# Patient Record
Sex: Male | Born: 1960 | Race: White | Hispanic: No | Marital: Married | Smoking: Never smoker
Health system: Southern US, Community
[De-identification: ages and names within clinical notes are randomized; demographics above are authoritative.]

## PROBLEM LIST (undated history)

## (undated) DIAGNOSIS — E785 Hyperlipidemia, unspecified: Secondary | ICD-10-CM

## (undated) DIAGNOSIS — I639 Cerebral infarction, unspecified: Secondary | ICD-10-CM

## (undated) DIAGNOSIS — I240 Acute coronary thrombosis not resulting in myocardial infarction: Secondary | ICD-10-CM

## (undated) DIAGNOSIS — I10 Essential (primary) hypertension: Secondary | ICD-10-CM

## (undated) DIAGNOSIS — E119 Type 2 diabetes mellitus without complications: Secondary | ICD-10-CM

## (undated) HISTORY — PX: HERNIA REPAIR: SHX51

## (undated) HISTORY — DX: Hyperlipidemia, unspecified: E78.5

## (undated) HISTORY — DX: Acute coronary thrombosis not resulting in myocardial infarction: I24.0

## (undated) HISTORY — DX: Cerebral infarction, unspecified: I63.9

---

## 2015-07-18 DIAGNOSIS — E782 Mixed hyperlipidemia: Secondary | ICD-10-CM | POA: Diagnosis not present

## 2015-07-18 DIAGNOSIS — I10 Essential (primary) hypertension: Secondary | ICD-10-CM | POA: Diagnosis not present

## 2015-07-18 DIAGNOSIS — E1165 Type 2 diabetes mellitus with hyperglycemia: Secondary | ICD-10-CM | POA: Diagnosis not present

## 2015-07-24 DIAGNOSIS — E1165 Type 2 diabetes mellitus with hyperglycemia: Secondary | ICD-10-CM | POA: Diagnosis not present

## 2015-07-24 DIAGNOSIS — N529 Male erectile dysfunction, unspecified: Secondary | ICD-10-CM | POA: Diagnosis not present

## 2015-07-24 DIAGNOSIS — I10 Essential (primary) hypertension: Secondary | ICD-10-CM | POA: Diagnosis not present

## 2015-07-24 DIAGNOSIS — E782 Mixed hyperlipidemia: Secondary | ICD-10-CM | POA: Diagnosis not present

## 2016-10-22 DIAGNOSIS — M1712 Unilateral primary osteoarthritis, left knee: Secondary | ICD-10-CM | POA: Diagnosis not present

## 2016-10-22 DIAGNOSIS — Z87891 Personal history of nicotine dependence: Secondary | ICD-10-CM | POA: Diagnosis not present

## 2016-10-22 DIAGNOSIS — R030 Elevated blood-pressure reading, without diagnosis of hypertension: Secondary | ICD-10-CM | POA: Diagnosis not present

## 2016-10-22 DIAGNOSIS — M25462 Effusion, left knee: Secondary | ICD-10-CM | POA: Diagnosis not present

## 2016-10-22 DIAGNOSIS — M25562 Pain in left knee: Secondary | ICD-10-CM | POA: Diagnosis not present

## 2016-10-22 DIAGNOSIS — I10 Essential (primary) hypertension: Secondary | ICD-10-CM | POA: Diagnosis not present

## 2016-10-28 DIAGNOSIS — M1712 Unilateral primary osteoarthritis, left knee: Secondary | ICD-10-CM | POA: Diagnosis not present

## 2016-10-28 DIAGNOSIS — E782 Mixed hyperlipidemia: Secondary | ICD-10-CM | POA: Diagnosis not present

## 2016-10-28 DIAGNOSIS — E1165 Type 2 diabetes mellitus with hyperglycemia: Secondary | ICD-10-CM | POA: Diagnosis not present

## 2016-10-28 DIAGNOSIS — I10 Essential (primary) hypertension: Secondary | ICD-10-CM | POA: Diagnosis not present

## 2016-12-08 DIAGNOSIS — E1165 Type 2 diabetes mellitus with hyperglycemia: Secondary | ICD-10-CM | POA: Diagnosis not present

## 2016-12-08 DIAGNOSIS — E7800 Pure hypercholesterolemia, unspecified: Secondary | ICD-10-CM | POA: Diagnosis not present

## 2016-12-08 DIAGNOSIS — E291 Testicular hypofunction: Secondary | ICD-10-CM | POA: Diagnosis not present

## 2016-12-08 DIAGNOSIS — I10 Essential (primary) hypertension: Secondary | ICD-10-CM | POA: Diagnosis not present

## 2016-12-08 DIAGNOSIS — E782 Mixed hyperlipidemia: Secondary | ICD-10-CM | POA: Diagnosis not present

## 2016-12-11 DIAGNOSIS — E1165 Type 2 diabetes mellitus with hyperglycemia: Secondary | ICD-10-CM | POA: Diagnosis not present

## 2016-12-11 DIAGNOSIS — I10 Essential (primary) hypertension: Secondary | ICD-10-CM | POA: Diagnosis not present

## 2016-12-11 DIAGNOSIS — E291 Testicular hypofunction: Secondary | ICD-10-CM | POA: Diagnosis not present

## 2016-12-11 DIAGNOSIS — E782 Mixed hyperlipidemia: Secondary | ICD-10-CM | POA: Diagnosis not present

## 2017-03-19 DIAGNOSIS — I10 Essential (primary) hypertension: Secondary | ICD-10-CM | POA: Diagnosis not present

## 2017-03-19 DIAGNOSIS — E1165 Type 2 diabetes mellitus with hyperglycemia: Secondary | ICD-10-CM | POA: Diagnosis not present

## 2017-03-19 DIAGNOSIS — E782 Mixed hyperlipidemia: Secondary | ICD-10-CM | POA: Diagnosis not present

## 2017-03-19 DIAGNOSIS — M1712 Unilateral primary osteoarthritis, left knee: Secondary | ICD-10-CM | POA: Diagnosis not present

## 2017-05-06 DIAGNOSIS — Z6833 Body mass index (BMI) 33.0-33.9, adult: Secondary | ICD-10-CM | POA: Diagnosis not present

## 2017-05-06 DIAGNOSIS — M1712 Unilateral primary osteoarthritis, left knee: Secondary | ICD-10-CM | POA: Diagnosis not present

## 2017-05-06 DIAGNOSIS — E1165 Type 2 diabetes mellitus with hyperglycemia: Secondary | ICD-10-CM | POA: Diagnosis not present

## 2017-06-16 DIAGNOSIS — E7800 Pure hypercholesterolemia, unspecified: Secondary | ICD-10-CM | POA: Diagnosis not present

## 2017-06-16 DIAGNOSIS — E782 Mixed hyperlipidemia: Secondary | ICD-10-CM | POA: Diagnosis not present

## 2017-06-16 DIAGNOSIS — E1165 Type 2 diabetes mellitus with hyperglycemia: Secondary | ICD-10-CM | POA: Diagnosis not present

## 2017-06-16 DIAGNOSIS — I10 Essential (primary) hypertension: Secondary | ICD-10-CM | POA: Diagnosis not present

## 2017-06-24 DIAGNOSIS — M1712 Unilateral primary osteoarthritis, left knee: Secondary | ICD-10-CM | POA: Diagnosis not present

## 2017-06-24 DIAGNOSIS — I10 Essential (primary) hypertension: Secondary | ICD-10-CM | POA: Diagnosis not present

## 2017-06-24 DIAGNOSIS — E782 Mixed hyperlipidemia: Secondary | ICD-10-CM | POA: Diagnosis not present

## 2017-06-24 DIAGNOSIS — E1165 Type 2 diabetes mellitus with hyperglycemia: Secondary | ICD-10-CM | POA: Diagnosis not present

## 2017-09-28 DIAGNOSIS — E1165 Type 2 diabetes mellitus with hyperglycemia: Secondary | ICD-10-CM | POA: Diagnosis not present

## 2017-09-28 DIAGNOSIS — Z6834 Body mass index (BMI) 34.0-34.9, adult: Secondary | ICD-10-CM | POA: Diagnosis not present

## 2017-09-28 DIAGNOSIS — E782 Mixed hyperlipidemia: Secondary | ICD-10-CM | POA: Diagnosis not present

## 2017-09-28 DIAGNOSIS — I10 Essential (primary) hypertension: Secondary | ICD-10-CM | POA: Diagnosis not present

## 2017-12-24 DIAGNOSIS — E1165 Type 2 diabetes mellitus with hyperglycemia: Secondary | ICD-10-CM | POA: Diagnosis not present

## 2017-12-24 DIAGNOSIS — Z8249 Family history of ischemic heart disease and other diseases of the circulatory system: Secondary | ICD-10-CM | POA: Diagnosis not present

## 2017-12-24 DIAGNOSIS — I209 Angina pectoris, unspecified: Secondary | ICD-10-CM | POA: Diagnosis not present

## 2017-12-24 DIAGNOSIS — E782 Mixed hyperlipidemia: Secondary | ICD-10-CM | POA: Diagnosis not present

## 2017-12-24 DIAGNOSIS — I10 Essential (primary) hypertension: Secondary | ICD-10-CM | POA: Diagnosis not present

## 2017-12-30 DIAGNOSIS — E1165 Type 2 diabetes mellitus with hyperglycemia: Secondary | ICD-10-CM | POA: Diagnosis not present

## 2017-12-30 DIAGNOSIS — Z8249 Family history of ischemic heart disease and other diseases of the circulatory system: Secondary | ICD-10-CM | POA: Diagnosis not present

## 2017-12-30 DIAGNOSIS — E782 Mixed hyperlipidemia: Secondary | ICD-10-CM | POA: Diagnosis not present

## 2017-12-30 DIAGNOSIS — I10 Essential (primary) hypertension: Secondary | ICD-10-CM | POA: Diagnosis not present

## 2018-03-30 DIAGNOSIS — Z6835 Body mass index (BMI) 35.0-35.9, adult: Secondary | ICD-10-CM | POA: Diagnosis not present

## 2018-03-30 DIAGNOSIS — E782 Mixed hyperlipidemia: Secondary | ICD-10-CM | POA: Diagnosis not present

## 2018-03-30 DIAGNOSIS — E1165 Type 2 diabetes mellitus with hyperglycemia: Secondary | ICD-10-CM | POA: Diagnosis not present

## 2018-03-30 DIAGNOSIS — I10 Essential (primary) hypertension: Secondary | ICD-10-CM | POA: Diagnosis not present

## 2018-08-10 DIAGNOSIS — E7800 Pure hypercholesterolemia, unspecified: Secondary | ICD-10-CM | POA: Diagnosis not present

## 2018-08-10 DIAGNOSIS — I10 Essential (primary) hypertension: Secondary | ICD-10-CM | POA: Diagnosis not present

## 2018-08-10 DIAGNOSIS — E1165 Type 2 diabetes mellitus with hyperglycemia: Secondary | ICD-10-CM | POA: Diagnosis not present

## 2018-08-10 DIAGNOSIS — E782 Mixed hyperlipidemia: Secondary | ICD-10-CM | POA: Diagnosis not present

## 2018-10-26 DIAGNOSIS — Z0001 Encounter for general adult medical examination with abnormal findings: Secondary | ICD-10-CM | POA: Diagnosis not present

## 2018-10-29 DIAGNOSIS — E7800 Pure hypercholesterolemia, unspecified: Secondary | ICD-10-CM | POA: Diagnosis not present

## 2018-10-29 DIAGNOSIS — Z8249 Family history of ischemic heart disease and other diseases of the circulatory system: Secondary | ICD-10-CM | POA: Diagnosis not present

## 2018-10-29 DIAGNOSIS — I10 Essential (primary) hypertension: Secondary | ICD-10-CM | POA: Diagnosis not present

## 2018-10-29 DIAGNOSIS — E1169 Type 2 diabetes mellitus with other specified complication: Secondary | ICD-10-CM | POA: Diagnosis not present

## 2019-01-25 DIAGNOSIS — E782 Mixed hyperlipidemia: Secondary | ICD-10-CM | POA: Diagnosis not present

## 2019-01-25 DIAGNOSIS — E1169 Type 2 diabetes mellitus with other specified complication: Secondary | ICD-10-CM | POA: Diagnosis not present

## 2019-01-25 DIAGNOSIS — E7800 Pure hypercholesterolemia, unspecified: Secondary | ICD-10-CM | POA: Diagnosis not present

## 2019-01-25 DIAGNOSIS — E1165 Type 2 diabetes mellitus with hyperglycemia: Secondary | ICD-10-CM | POA: Diagnosis not present

## 2019-01-27 DIAGNOSIS — E782 Mixed hyperlipidemia: Secondary | ICD-10-CM | POA: Diagnosis not present

## 2019-01-27 DIAGNOSIS — Z6833 Body mass index (BMI) 33.0-33.9, adult: Secondary | ICD-10-CM | POA: Diagnosis not present

## 2019-01-27 DIAGNOSIS — I10 Essential (primary) hypertension: Secondary | ICD-10-CM | POA: Diagnosis not present

## 2019-01-27 DIAGNOSIS — E1169 Type 2 diabetes mellitus with other specified complication: Secondary | ICD-10-CM | POA: Diagnosis not present

## 2019-10-12 DIAGNOSIS — I10 Essential (primary) hypertension: Secondary | ICD-10-CM | POA: Diagnosis not present

## 2019-10-12 DIAGNOSIS — E1169 Type 2 diabetes mellitus with other specified complication: Secondary | ICD-10-CM | POA: Diagnosis not present

## 2019-10-28 DIAGNOSIS — E1169 Type 2 diabetes mellitus with other specified complication: Secondary | ICD-10-CM | POA: Diagnosis not present

## 2019-10-28 DIAGNOSIS — Z6833 Body mass index (BMI) 33.0-33.9, adult: Secondary | ICD-10-CM | POA: Diagnosis not present

## 2019-10-28 DIAGNOSIS — I10 Essential (primary) hypertension: Secondary | ICD-10-CM | POA: Diagnosis not present

## 2019-10-28 DIAGNOSIS — E782 Mixed hyperlipidemia: Secondary | ICD-10-CM | POA: Diagnosis not present

## 2019-12-21 DIAGNOSIS — E782 Mixed hyperlipidemia: Secondary | ICD-10-CM | POA: Diagnosis not present

## 2019-12-21 DIAGNOSIS — E1169 Type 2 diabetes mellitus with other specified complication: Secondary | ICD-10-CM | POA: Diagnosis not present

## 2019-12-21 DIAGNOSIS — I10 Essential (primary) hypertension: Secondary | ICD-10-CM | POA: Diagnosis not present

## 2019-12-21 DIAGNOSIS — E7800 Pure hypercholesterolemia, unspecified: Secondary | ICD-10-CM | POA: Diagnosis not present

## 2019-12-27 DIAGNOSIS — E1169 Type 2 diabetes mellitus with other specified complication: Secondary | ICD-10-CM | POA: Diagnosis not present

## 2019-12-27 DIAGNOSIS — I10 Essential (primary) hypertension: Secondary | ICD-10-CM | POA: Diagnosis not present

## 2019-12-27 DIAGNOSIS — E782 Mixed hyperlipidemia: Secondary | ICD-10-CM | POA: Diagnosis not present

## 2019-12-27 DIAGNOSIS — Z8249 Family history of ischemic heart disease and other diseases of the circulatory system: Secondary | ICD-10-CM | POA: Diagnosis not present

## 2020-01-10 DIAGNOSIS — U071 COVID-19: Secondary | ICD-10-CM | POA: Diagnosis not present

## 2020-01-17 DIAGNOSIS — B948 Sequelae of other specified infectious and parasitic diseases: Secondary | ICD-10-CM | POA: Diagnosis not present

## 2020-01-17 DIAGNOSIS — R5383 Other fatigue: Secondary | ICD-10-CM | POA: Diagnosis not present

## 2020-01-17 DIAGNOSIS — Z20828 Contact with and (suspected) exposure to other viral communicable diseases: Secondary | ICD-10-CM | POA: Diagnosis not present

## 2020-01-30 DIAGNOSIS — B948 Sequelae of other specified infectious and parasitic diseases: Secondary | ICD-10-CM | POA: Diagnosis not present

## 2020-01-30 DIAGNOSIS — E782 Mixed hyperlipidemia: Secondary | ICD-10-CM | POA: Diagnosis not present

## 2020-01-30 DIAGNOSIS — E1169 Type 2 diabetes mellitus with other specified complication: Secondary | ICD-10-CM | POA: Diagnosis not present

## 2020-01-30 DIAGNOSIS — R5383 Other fatigue: Secondary | ICD-10-CM | POA: Diagnosis not present

## 2020-08-29 DIAGNOSIS — I5042 Chronic combined systolic (congestive) and diastolic (congestive) heart failure: Secondary | ICD-10-CM | POA: Insufficient documentation

## 2020-08-30 ENCOUNTER — Emergency Department (HOSPITAL_COMMUNITY): Payer: BC Managed Care – PPO

## 2020-08-30 ENCOUNTER — Inpatient Hospital Stay (HOSPITAL_COMMUNITY)
Admission: EM | Admit: 2020-08-30 | Discharge: 2020-09-03 | DRG: 064 | Disposition: A | Discharge status: Home / Self Care | Payer: BC Managed Care – PPO | Attending: Internal Medicine | Admitting: Internal Medicine

## 2020-08-30 ENCOUNTER — Other Ambulatory Visit: Payer: Self-pay

## 2020-08-30 ENCOUNTER — Encounter (HOSPITAL_COMMUNITY): Payer: Self-pay | Admitting: Emergency Medicine

## 2020-08-30 DIAGNOSIS — Z8249 Family history of ischemic heart disease and other diseases of the circulatory system: Secondary | ICD-10-CM | POA: Diagnosis not present

## 2020-08-30 DIAGNOSIS — Z79899 Other long term (current) drug therapy: Secondary | ICD-10-CM | POA: Diagnosis not present

## 2020-08-30 DIAGNOSIS — H538 Other visual disturbances: Secondary | ICD-10-CM | POA: Diagnosis present

## 2020-08-30 DIAGNOSIS — I110 Hypertensive heart disease with heart failure: Secondary | ICD-10-CM | POA: Diagnosis present

## 2020-08-30 DIAGNOSIS — I429 Cardiomyopathy, unspecified: Secondary | ICD-10-CM | POA: Diagnosis present

## 2020-08-30 DIAGNOSIS — I10 Essential (primary) hypertension: Secondary | ICD-10-CM | POA: Diagnosis not present

## 2020-08-30 DIAGNOSIS — R739 Hyperglycemia, unspecified: Secondary | ICD-10-CM

## 2020-08-30 DIAGNOSIS — Z6834 Body mass index (BMI) 34.0-34.9, adult: Secondary | ICD-10-CM

## 2020-08-30 DIAGNOSIS — R911 Solitary pulmonary nodule: Secondary | ICD-10-CM

## 2020-08-30 DIAGNOSIS — Z833 Family history of diabetes mellitus: Secondary | ICD-10-CM

## 2020-08-30 DIAGNOSIS — I5021 Acute systolic (congestive) heart failure: Secondary | ICD-10-CM | POA: Diagnosis present

## 2020-08-30 DIAGNOSIS — R29810 Facial weakness: Secondary | ICD-10-CM | POA: Diagnosis present

## 2020-08-30 DIAGNOSIS — R471 Dysarthria and anarthria: Secondary | ICD-10-CM | POA: Diagnosis present

## 2020-08-30 DIAGNOSIS — R1310 Dysphagia, unspecified: Secondary | ICD-10-CM | POA: Diagnosis present

## 2020-08-30 DIAGNOSIS — E669 Obesity, unspecified: Secondary | ICD-10-CM | POA: Diagnosis present

## 2020-08-30 DIAGNOSIS — Z7982 Long term (current) use of aspirin: Secondary | ICD-10-CM | POA: Diagnosis not present

## 2020-08-30 DIAGNOSIS — Z20822 Contact with and (suspected) exposure to covid-19: Secondary | ICD-10-CM | POA: Diagnosis present

## 2020-08-30 DIAGNOSIS — R000 Tachycardia, unspecified: Secondary | ICD-10-CM | POA: Diagnosis present

## 2020-08-30 DIAGNOSIS — G4730 Sleep apnea, unspecified: Secondary | ICD-10-CM | POA: Diagnosis present

## 2020-08-30 DIAGNOSIS — I672 Cerebral atherosclerosis: Secondary | ICD-10-CM | POA: Diagnosis present

## 2020-08-30 DIAGNOSIS — Z87891 Personal history of nicotine dependence: Secondary | ICD-10-CM | POA: Diagnosis not present

## 2020-08-30 DIAGNOSIS — E1165 Type 2 diabetes mellitus with hyperglycemia: Secondary | ICD-10-CM | POA: Diagnosis present

## 2020-08-30 DIAGNOSIS — R29701 NIHSS score 1: Secondary | ICD-10-CM | POA: Diagnosis present

## 2020-08-30 DIAGNOSIS — I639 Cerebral infarction, unspecified: Secondary | ICD-10-CM

## 2020-08-30 DIAGNOSIS — I6381 Other cerebral infarction due to occlusion or stenosis of small artery: Secondary | ICD-10-CM | POA: Diagnosis not present

## 2020-08-30 DIAGNOSIS — E1169 Type 2 diabetes mellitus with other specified complication: Secondary | ICD-10-CM | POA: Diagnosis not present

## 2020-08-30 DIAGNOSIS — E785 Hyperlipidemia, unspecified: Secondary | ICD-10-CM | POA: Diagnosis present

## 2020-08-30 DIAGNOSIS — R4701 Aphasia: Secondary | ICD-10-CM | POA: Diagnosis present

## 2020-08-30 DIAGNOSIS — E119 Type 2 diabetes mellitus without complications: Secondary | ICD-10-CM

## 2020-08-30 DIAGNOSIS — Z7984 Long term (current) use of oral hypoglycemic drugs: Secondary | ICD-10-CM

## 2020-08-30 DIAGNOSIS — I6389 Other cerebral infarction: Secondary | ICD-10-CM | POA: Diagnosis not present

## 2020-08-30 HISTORY — DX: Cerebral infarction, unspecified: I63.9

## 2020-08-30 HISTORY — DX: Essential (primary) hypertension: I10

## 2020-08-30 HISTORY — DX: Type 2 diabetes mellitus without complications: E11.9

## 2020-08-30 LAB — CBC
HCT: 45.1 % (ref 39.0–52.0)
Hemoglobin: 15.1 g/dL (ref 13.0–17.0)
MCH: 28.8 pg (ref 26.0–34.0)
MCHC: 33.5 g/dL (ref 30.0–36.0)
MCV: 85.9 fL (ref 80.0–100.0)
Platelets: 208 10*3/uL (ref 150–400)
RBC: 5.25 MIL/uL (ref 4.22–5.81)
RDW: 13.5 % (ref 11.5–15.5)
WBC: 6.5 10*3/uL (ref 4.0–10.5)
nRBC: 0 % (ref 0.0–0.2)

## 2020-08-30 LAB — URINALYSIS, ROUTINE W REFLEX MICROSCOPIC
Bacteria, UA: NONE SEEN
Bilirubin Urine: NEGATIVE
Glucose, UA: >=500 mg/dL — AB
Hgb urine dipstick: NEGATIVE
Ketones, ur: 5 mg/dL — AB
Leukocytes,Ua: NEGATIVE
Nitrite: NEGATIVE
Protein, ur: NEGATIVE mg/dL
Specific Gravity, Urine: 1.023 (ref 1.005–1.030)
pH: 6 (ref 5.0–8.0)

## 2020-08-30 LAB — COMPREHENSIVE METABOLIC PANEL
ALT: 44 U/L (ref 0–44)
AST: 37 U/L (ref 15–41)
Albumin: 4 g/dL (ref 3.5–5.0)
Alkaline Phosphatase: 83 U/L (ref 38–126)
Anion gap: 9 (ref 5–15)
BUN: 11 mg/dL (ref 8–23)
CO2: 29 mmol/L (ref 22–32)
Calcium: 8.9 mg/dL (ref 8.9–10.3)
Chloride: 94 mmol/L — ABNORMAL LOW (ref 98–111)
Creatinine, Ser: 0.71 mg/dL (ref 0.61–1.24)
GFR, Estimated: >60 mL/min (ref >60)
Glucose, Bld: 341 mg/dL — ABNORMAL HIGH (ref 70–99)
Potassium: 3.6 mmol/L (ref 3.5–5.1)
Sodium: 132 mmol/L — ABNORMAL LOW (ref 135–145)
Total Bilirubin: 1.1 mg/dL (ref 0.3–1.2)
Total Protein: 7.6 g/dL (ref 6.5–8.1)

## 2020-08-30 LAB — I-STAT CHEM 8, ED
BUN: 10 mg/dL (ref 8–23)
Calcium, Ion: 0.98 mmol/L — ABNORMAL LOW (ref 1.15–1.40)
Chloride: 94 mmol/L — ABNORMAL LOW (ref 98–111)
Creatinine, Ser: 0.6 mg/dL — ABNORMAL LOW (ref 0.61–1.24)
Glucose, Bld: 348 mg/dL — ABNORMAL HIGH (ref 70–99)
HCT: 47 % (ref 39.0–52.0)
Hemoglobin: 16 g/dL (ref 13.0–17.0)
Potassium: 3.7 mmol/L (ref 3.5–5.1)
Sodium: 135 mmol/L (ref 135–145)
TCO2: 29 mmol/L (ref 22–32)

## 2020-08-30 LAB — CBG MONITORING, ED: Glucose-Capillary: 350 mg/dL — ABNORMAL HIGH (ref 70–99)

## 2020-08-30 LAB — DIFFERENTIAL
Abs Immature Granulocytes: 0.01 10*3/uL (ref 0.00–0.07)
Basophils Absolute: 0 10*3/uL (ref 0.0–0.1)
Basophils Relative: 1 %
Eosinophils Absolute: 0.1 10*3/uL (ref 0.0–0.5)
Eosinophils Relative: 2 %
Immature Granulocytes: 0 %
Lymphocytes Relative: 20 %
Lymphs Abs: 1.3 10*3/uL (ref 0.7–4.0)
Monocytes Absolute: 0.4 10*3/uL (ref 0.1–1.0)
Monocytes Relative: 7 %
Neutro Abs: 4.6 10*3/uL (ref 1.7–7.7)
Neutrophils Relative %: 70 %

## 2020-08-30 LAB — RAPID URINE DRUG SCREEN, HOSP PERFORMED
Amphetamines: NOT DETECTED
Barbiturates: NOT DETECTED
Benzodiazepines: NOT DETECTED
Cocaine: NOT DETECTED
Opiates: NOT DETECTED
Tetrahydrocannabinol: NOT DETECTED

## 2020-08-30 LAB — APTT: aPTT: 27 seconds (ref 24–36)

## 2020-08-30 LAB — ETHANOL: Alcohol, Ethyl (B): <10 mg/dL (ref <10)

## 2020-08-30 LAB — PROTIME-INR
INR: 0.9 (ref 0.8–1.2)
Prothrombin Time: 12.4 seconds (ref 11.4–15.2)

## 2020-08-30 LAB — RESP PANEL BY RT-PCR (FLU A&B, COVID) ARPGX2
Influenza A by PCR: NEGATIVE
Influenza B by PCR: NEGATIVE
SARS Coronavirus 2 by RT PCR: NEGATIVE

## 2020-08-30 MED ORDER — CLOPIDOGREL BISULFATE 75 MG PO TABS: 75.0000 mg | ORAL_TABLET | Freq: Every day | ORAL | Status: DC

## 2020-08-30 MED ORDER — INSULIN ASPART 100 UNIT/ML IJ SOLN
10.0000 [IU] | Freq: Once | INTRAMUSCULAR | Status: AC
  Administered 2020-08-30: 10 [IU] via INTRAVENOUS
  Filled 2020-08-30: qty 1

## 2020-08-30 MED ORDER — ASPIRIN 300 MG RE SUPP
300.0000 mg | Freq: Once | RECTAL | Status: AC
  Administered 2020-08-31: 300 mg via RECTAL
  Filled 2020-08-30: qty 1

## 2020-08-30 MED ORDER — SODIUM CHLORIDE 0.9 % IV BOLUS
1000.0000 mL | Freq: Once | INTRAVENOUS | Status: AC
  Administered 2020-08-30: 1000 mL via INTRAVENOUS

## 2020-08-30 MED ORDER — ASPIRIN EC 81 MG PO TBEC: 81.0000 mg | DELAYED_RELEASE_TABLET | Freq: Every day | ORAL | Status: DC

## 2020-08-30 NOTE — ED Provider Notes (Signed)
[facility] EMERGENCY DEPARTMENT Provider Note   CSN: [id number] Arrival date & time: 08/30/20  1006     History Chief Complaint  Patient presents with   Aphasia    Tony Combs is a 60 y.o. male.  Patient presenting with the onset of slurred speech blurry vision double vision 9:00 yesterday morning.  Patient was evaluated at work and then sent to [facility] at 1500.  Patient had a long wait however he did have a head CT done and blood work done.  Patient's wait was so long he left AMA.  Patient still with the slurred speech.  Also with a complaint of a headache.  Along with the visual changes.  Also states that his right side of his face is weak.  And has some tingling on the right side of the face.  Patient states he has had symptoms like this intermittently lasting for a few hours over the last 2 months.  Also has felt dizzy at work at times for the past 2 months.  Denies any upper extremity or lower extremity weakness.  Past medical history significant for diabetes and hypertension.  Patient does have a primary care doctor.      Past Medical History:  Diagnosis Date   Diabetes mellitus without complication (HCC)    Hypertension     There are no problems to display for this patient.   Past Surgical History:  Procedure Laterality Date   HERNIA REPAIR         No family history on file.  Social History   Tobacco Use   Smoking status: Never   Smokeless tobacco: Never  Substance Use Topics   Alcohol use: Not Currently   Drug use: Not Currently    Home Medications Prior to Admission medications   Medication Sig Start Date End Date Taking? Authorizing Provider  amLODipine (NORVASC) 10 MG tablet Take 10 mg by mouth daily.   Yes [provider]  amoxicillin (AMOXIL) 500 MG capsule Take 500 mg by mouth 3 (three) times daily. 08/23/20  Yes [provider]  atorvastatin (LIPITOR) 20 MG tablet Take 20 mg by mouth daily.   Yes [provider]   glipiZIDE (GLUCOTROL) 10 MG tablet Take 10 mg by mouth in the morning and at bedtime.   Yes [provider]  hydrochlorothiazide (HYDRODIURIL) 25 MG tablet Take 25 mg by mouth daily.   Yes [provider]  metFORMIN (GLUCOPHAGE) 500 MG tablet Take 500 mg by mouth 2 (two) times daily. 03/25/20  Yes [provider]  RYBELSUS 7 MG TABS Take 1 tablet by mouth daily. 03/22/20  Yes [provider]  ibuprofen (ADVIL) 600 MG tablet Take 1 tablet by mouth daily as needed. 08/23/20   [provider]    Allergies    Patient has no known allergies.  Review of Systems   Review of Systems  Constitutional:  Negative for chills and fever.  HENT:  Negative for congestion, rhinorrhea and sore throat.   Eyes:  Positive for visual disturbance.  Respiratory:  Negative for cough and shortness of breath.   Cardiovascular:  Negative for chest pain and leg swelling.  Gastrointestinal:  Negative for abdominal pain, diarrhea, nausea and vomiting.  Genitourinary:  Negative for dysuria.  Musculoskeletal:  Negative for back pain and neck pain.  Skin:  Negative for rash.  Neurological:  Positive for facial asymmetry, speech difficulty, weakness, numbness and headaches. Negative for dizziness, tremors, syncope and light-headedness.  Hematological:  Does  not bruise/bleed easily.  Psychiatric/Behavioral:  Negative for confusion.    Physical Exam Updated Vital Signs BP (!) 141/87   Pulse (!) 109   Temp 98 F (36.7 C) (Oral)   Resp 20   Ht 1.727 m (5\' 8" )   Wt 101.6 kg   SpO2 96%   BMI 34.06 kg/m   Physical Exam Vitals and nursing note reviewed.  Constitutional:      General: He is not in acute distress.    Appearance: Normal appearance. He is well-developed.  HENT:     Head: Normocephalic and atraumatic.  Eyes:     Extraocular Movements: Extraocular movements intact.     Conjunctiva/sclera: Conjunctivae normal.     Pupils: Pupils are equal, round, and reactive to  light.  Cardiovascular:     Rate and Rhythm: Normal rate and regular rhythm.     Heart sounds: No murmur heard. Pulmonary:     Effort: Pulmonary effort is normal. No respiratory distress.     Breath sounds: Normal breath sounds. No wheezing.  Abdominal:     Palpations: Abdomen is soft.     Tenderness: There is no abdominal tenderness. There is no guarding.  Musculoskeletal:        General: No swelling.     Cervical back: Normal range of motion and neck supple. No rigidity.  Skin:    General: Skin is warm and dry.     Capillary Refill: Capillary refill takes less than 2 seconds.     Findings: No rash.  Neurological:     Mental Status: He is alert and oriented to person, place, and time.     Cranial Nerves: Cranial nerve deficit present.     Sensory: No sensory deficit.     Motor: No weakness.     Coordination: Coordination normal.     Comments: Patient with some numbness to the right side of the face.  There is right facial weakness.  With slight involvement of the forehead area as well.  Speech is slurred.  No coordination problems.  No motor weakness.  No numbness in the extremities    ED Results / Procedures / Treatments   Labs (all labs ordered are listed, but only abnormal results are displayed) Labs Reviewed  COMPREHENSIVE METABOLIC PANEL - Abnormal; Notable for the following components:      Result Value   Sodium 132 (*)    Chloride 94 (*)    Glucose, Bld 341 (*)    All other components within normal limits  URINALYSIS, ROUTINE W REFLEX MICROSCOPIC - Abnormal; Notable for the following components:   Glucose, UA >=500 (*)    Ketones, ur 5 (*)    All other components within normal limits  I-STAT CHEM 8, ED - Abnormal; Notable for the following components:   Chloride 94 (*)    Creatinine, Ser 0.60 (*)    Glucose, Bld 348 (*)    Calcium, Ion 0.98 (*)    All other components within normal limits  CBG MONITORING, ED - Abnormal; Notable for the following components:    Glucose-Capillary 350 (*)    All other components within normal limits  RESP PANEL BY RT-PCR (FLU A&B, COVID) ARPGX2  ETHANOL  PROTIME-INR  APTT  CBC  DIFFERENTIAL  RAPID URINE DRUG SCREEN, HOSP PERFORMED    EKG EKG Interpretation  Date/Time:  Thursday August 30 2020 10:28:09 EDT Ventricular Rate:  117 PR Interval:  155 QRS Duration: 88 QT Interval:  330 QTC Calculation: 461 R Axis:   -  30 Text Interpretation: Sinus tachycardia Left ventricular hypertrophy Borderline T abnormalities, lateral leads Baseline wander in lead(s) III No previous ECGs available Confirmed by Vanetta MuldersZackowski, Boleslaus Holloway [phone]([id number]) on 08/30/2020 10:38:20 AM  Radiology CT HEAD WO CONTRAST  Result Date: 08/30/2020 CLINICAL DATA:  Slurred speech for 1 month EXAM: CT HEAD WITHOUT CONTRAST TECHNIQUE: Contiguous axial images were obtained from the base of the skull through the vertex without intravenous contrast. COMPARISON:  08/29/2020 FINDINGS: Brain: No evidence of acute infarction, hemorrhage, hydrocephalus, extra-axial collection or mass lesion/mass effect. Cavum septum pellucidum incidentally noted. Minimal low-density changes within the periventricular and subcortical white matter compatible with chronic microvascular ischemic change. Vascular: Atherosclerotic calcifications involving the large vessels of the skull base. No unexpected hyperdense vessel. Skull: Normal. Negative for fracture or focal lesion. Sinuses/Orbits: No acute finding. Other: None. IMPRESSION: 1. No acute intracranial findings. 2. Mild chronic microvascular ischemic change. Electronically Signed   By: Duanne GuessNicholas  Plundo D.O.   On: 08/30/2020 12:01   DG Chest Port 1 View  Result Date: 08/30/2020 CLINICAL DATA:  Dizziness. EXAM: PORTABLE CHEST 1 VIEW COMPARISON:  None. FINDINGS: The heart size and mediastinal contours are within normal limits. Hazy left basilar opacities. No visible pleural effusions or pneumothorax on this single semi-upright radiograph.  IMPRESSION: Hazy left basilar opacities, which may represent prominent diaphragmatic/mediastinal fat and/or atelectasis. Infection is difficult to exclude in this area and dedicated PA and lateral radiographs could further characterize if clinically indicated. Electronically Signed   By: Feliberto HartsFrederick S Altergott MD   On: 08/30/2020 11:52    Procedures Procedures   CRITICAL CARE Performed by: Vanetta MuldersScott Zavia Pullen Total critical care time: 60 minutes Critical care time was exclusive of separately billable procedures and treating other patients. Critical care was necessary to treat or prevent imminent or life-threatening deterioration. Critical care was time spent personally by me on the following activities: development of treatment plan with patient and/or surrogate as well as nursing, discussions with consultants, evaluation of patient's response to treatment, examination of patient, obtaining history from patient or surrogate, ordering and performing treatments and interventions, ordering and review of laboratory studies, ordering and review of radiographic studies, pulse oximetry and re-evaluation of patient's condition.   Medications Ordered in ED Medications  insulin aspart (novoLOG) injection 10 Units (has no administration in time range)    ED Course  I have reviewed the triage vital signs and the nursing notes.  Pertinent labs & imaging results that were available during my care of the patient were reviewed by me and considered in my medical decision making (see chart for details).    MDM Rules/Calculators/A&P                          Patient's presentation concerning for neoplastic process.  Certainly has had symptoms on and off at times for the past 2 months.  Certainly the symptoms also that developed just yesterday which are similar could be related to stroke.  Patient had a negative head CT [facility].  Patient's head CT today is also negative.  So patient will get  MRI.  Contacted by MRI about possible abnormality so they wanted to do MRA.  Patient's regular chest x-ray raise some concerns about pneumonia clinically was not suspicious of pneumonia but could have been suspicious for neoplastic process so CT chest without was done and that just shows some pulmonary nodules but no acute findings or any obvious neoplastic process.  Labs negative for COVID  drug screen negative.  Blood sugar elevated at 348 but not any evidence of metabolic acidosis.  Alcohol not elevated.  INR normal.  No leukocytosis no significant anemia hemoglobin is normal at 15.1.  MRIs are still pending.  Patient turned over to Dr. Elnoria Howard evening ED physician who will follow them up.  And will discuss findings if appropriate with neurology.  She at the very least will need outpatient neurology follow-up for his symptoms  Is also possible that the facial weakness could just be a Bell's palsy.  Patient able to close his eyes tightly. Final Clinical Impression(s) / ED Diagnoses Final diagnoses:  Hyperglycemia  Cerebrovascular accident (CVA), unspecified mechanism [facility])    Rx / DC Orders ED Discharge Orders     None        Vanetta Mulders, MD 08/30/20 1533

## 2020-08-30 NOTE — ED Triage Notes (Signed)
Pt reports sudden onset slurred speech, double vision and blurry vision at 0900 yesterday am; reports was seen at [facility] yesterday 1500 where they completed a CT, EKG and bloodwork but reports after 8 hours he never saw a dr so he left AMA

## 2020-08-30 NOTE — H&P (Signed)
History and Physical    Tony Combs ERX:[id number] DOB: [birth year] DOA: 08/30/2020  PCP: Juliette Alcide, MD   Patient coming from: Home  I have personally briefly reviewed patient's old medical records in [facility]  Chief Complaint: Slurred speech  HPI: Tony Combs is a 60 y.o. male with medical history significant for diabetes, hypertension.  Patient presented to the ED today with complaints of double vision, blurry vision, slurred speech.  Patient, and ex-wife/friend present at bedside reports that since May, patient has had mild symptoms involving different parts of his body that usually resolve without intervention, so he did not see a provider for these.  He reports an episode of drooping involving his left eye, double vision, dizziness, left arm paresthesias, left arm weakness, weakness of his lower extremity left and then right (not at the same time). Yesterday morning at about 9 AM, he had the most severe episode of slurred speech and double vision hence he presented to the [facility] for left before he saw a provider after he got frustrated with the long wait time. Both slurred speech and vision problems are still present today, but have improved compared to yesterday.  Ex-wife reports she can understand patient today compared to yesterday.  Patient reports intermittent difficulty swallowing worse with liquids started yesterday.  No droopiness noted, no weakness of extremities.  Patient presented at [facility] yesterday, per Care Everywhere, patient had head CT done, which was unremarkable and blood work, but due to the long wait, patient left AMA.  Per triage notes, symptoms have been ongoing since 5th of July.  ED Course: Temperature 98.1, heart rate 1 teens to 120s.  Respiratory rate 18-22.  Blood pressure systolic 130s to 950D.  Blood glucose 341.  Sodium 132.  Potassium 3.6.  Head CT here unremarkable.  MRI brain showed 12 mm acute infarct within the anterior medial right frontal  lobe, with tiny chronic infarct in left thalamus.  Subsequent MRI brain was without large vessel occlusion. Portable chest x-ray showed left basilar opacities, difficult to exclude infection, subsequent chest CT-acute abnormality, showed 4 mm right middle lobe pulmonary nodule. Hospitalist to admit for acute CVA.  Review of Systems: As per HPI all other systems reviewed and negative.  Past Medical History:  Diagnosis Date   Diabetes mellitus without complication (HCC)    Hypertension     Past Surgical History:  Procedure Laterality Date   HERNIA REPAIR       reports that he has never smoked. He has never used smokeless tobacco. He reports previous alcohol use. He reports previous drug use.  No Known Allergies  Family history of hypertension.    Prior to Admission medications   Medication Sig Start Date End Date Taking? Authorizing Provider  amLODipine (NORVASC) 10 MG tablet Take 10 mg by mouth daily.   Yes [provider]  amoxicillin (AMOXIL) 500 MG capsule Take 500 mg by mouth 3 (three) times daily. 08/23/20  Yes [provider]  atorvastatin (LIPITOR) 20 MG tablet Take 20 mg by mouth daily.   Yes [provider]  glipiZIDE (GLUCOTROL) 10 MG tablet Take 10 mg by mouth in the morning and at bedtime.   Yes [provider]  hydrochlorothiazide (HYDRODIURIL) 25 MG tablet Take 25 mg by mouth daily.   Yes [provider]  metFORMIN (GLUCOPHAGE) 500 MG tablet Take 500 mg by mouth 2 (two) times daily. 03/25/20  Yes [provider]  RYBELSUS 7 MG TABS Take 1  tablet by mouth daily. 03/22/20  Yes [provider]  ibuprofen (ADVIL) 600 MG tablet Take 1 tablet by mouth daily as needed. 08/23/20   [provider]    Physical Exam: Vitals:   08/30/20 1438 08/30/20 1500 08/30/20 1700 08/30/20 1730  BP: (!) 144/97 (!) 149/93 (!) 147/87 (!) 159/100  Pulse: (!) 57 (!) 115 (!) 105 (!) 113  Resp: Temp: 98.1 F (36.7 C)      TempSrc: Oral     SpO2: 97% 97% 98% 96%  Weight:      Height:        Constitutional: Appears slightly drowsy, calm, comfortable Vitals:   08/30/20 1438 08/30/20 1500 08/30/20 1700 08/30/20 1730  BP: (!) 144/97 (!) 149/93 (!) 147/87 (!) 159/100  Pulse: (!) 57 (!) 115 (!) 105 (!) 113  Resp: Temp: 98.1 F (36.7 C)     TempSrc: Oral     SpO2: 97% 97% 98% 96%  Weight:      Height:       Eyes: Right pupil more reactive than left, mild drooping involving upper lids worse on the right- Per ex-wife this is chronic.  Conjunctivae normal ENMT: Mucous membranes are moist.  Neck: normal, supple, no masses, no thyromegaly Respiratory: clear to auscultation bilaterally, no wheezing, no crackles. Normal respiratory effort. No accessory muscle use.  Cardiovascular: Tachycardic, regular rate and rhythm, no murmurs / rubs / gallops. No extremity edema. 2+ pedal pulses.  Abdomen: no tenderness, no masses palpated. No hepatosplenomegaly. Bowel sounds positive.  Musculoskeletal: no clubbing / cyanosis. No joint deformity upper and lower extremities. Good ROM, no contractures. Normal muscle tone.  Skin: no rashes, lesions, ulcers. No induration Neurologic:  Neurological:     GCS: GCS eye subscore is 4. GCS verbal subscore is 5. GCS motor subscore is 6.     Comments: Mental Status:  Alert, oriented, thought content appropriate, speech is moderately slurred, at times difficult to understand patient.  Some difficulty in following two-step commands cranial Nerves:  II:   pupils equal, round, right pupil reactive to light, left pupil not so much.  He is able to finger count with both eyes III,IV, VI: mild ptosis present bilaterally worse on the right, extra-ocular motions intact bilaterally  V,VII: smile symmetric, eyebrows raise symmetric, facial light touch sensation equal VIII: hearing grossly normal to voice  X:   XI: bilateral shoulder shrug symmetric and strong XII: midline tongue  extension without fassiculations Motor:  Normal tone.   5/5 strength in bilateral upper and lower extremities  Sensory: Sensation intact to light touch in all extremities.  Cerebellar: normal finger-to-nose with bilateral upper extremities.  No pronator drift.  CV: distal pulses palpable throughout  Psychiatric: Normal judgment and insight. Alert and oriented x 3. Normal mood.   Labs on Admission: I have personally reviewed following labs and imaging studies  CBC: Recent Labs  Lab 08/30/20 1037 08/30/20 1049  WBC 6.5  --   NEUTROABS 4.6  --   HGB 15.1 16.0  HCT 45.1 47.0  MCV 85.9  --   PLT 208  --    Basic Metabolic Panel: Recent Labs  Lab 08/30/20 1037 08/30/20 1049  NA 132* 135  K 3.6 3.7  CL 94* 94*  CO2 29  --   GLUCOSE 341* 348*  BUN 11 10  CREATININE 0.71 0.60*  CALCIUM 8.9  --    Liver Function Tests: Recent Labs  Lab  08/30/20 1037  AST 37  ALT 44  ALKPHOS 83  BILITOT 1.1  PROT 7.6  ALBUMIN 4.0   Coagulation Profile: Recent Labs  Lab 08/30/20 1037  INR 0.9   CBG: Recent Labs  Lab 08/30/20 1047  GLUCAP 350*       Component Value Date/Time   COLORURINE YELLOW 08/30/2020 1109   APPEARANCEUR CLEAR 08/30/2020 1109   LABSPEC 1.023 08/30/2020 1109   PHURINE 6.0 08/30/2020 1109   GLUCOSEU >=500 (A) 08/30/2020 1109   HGBUR NEGATIVE 08/30/2020 1109   BILIRUBINUR NEGATIVE 08/30/2020 1109   KETONESUR 5 (A) 08/30/2020 1109   PROTEINUR NEGATIVE 08/30/2020 1109   NITRITE NEGATIVE 08/30/2020 1109   LEUKOCYTESUR NEGATIVE 08/30/2020 1109    Radiological Exams on Admission: CT HEAD WO CONTRAST  Result Date: 08/30/2020 CLINICAL DATA:  Slurred speech for 1 month EXAM: CT HEAD WITHOUT CONTRAST TECHNIQUE: Contiguous axial images were obtained from the base of the skull through the vertex without intravenous contrast. COMPARISON:  08/29/2020 FINDINGS: Brain: No evidence of acute infarction, hemorrhage, hydrocephalus, extra-axial collection or mass  lesion/mass effect. Cavum septum pellucidum incidentally noted. Minimal low-density changes within the periventricular and subcortical white matter compatible with chronic microvascular ischemic change. Vascular: Atherosclerotic calcifications involving the large vessels of the skull base. No unexpected hyperdense vessel. Skull: Normal. Negative for fracture or focal lesion. Sinuses/Orbits: No acute finding. Other: None. IMPRESSION: 1. No acute intracranial findings. 2. Mild chronic microvascular ischemic change. Electronically Signed   By: Duanne GuessNicholas  Plundo D.O.   On: 08/30/2020 12:01   CT Chest Wo Contrast  Result Date: 08/30/2020 CLINICAL DATA:  Basilar opacities on recent chest x-ray. EXAM: CT CHEST WITHOUT CONTRAST TECHNIQUE: Multidetector CT imaging of the chest was performed following the standard protocol without IV contrast. COMPARISON:  Chest x-ray earlier today FINDINGS: Cardiovascular: The heart size is normal. No substantial pericardial effusion. Mild atherosclerotic calcification is noted in the wall of the thoracic aorta. Mediastinum/Nodes: No mediastinal lymphadenopathy. No evidence for gross hilar lymphadenopathy although assessment is limited by the lack of intravenous contrast on today's study. The esophagus has normal imaging features. There is no axillary lymphadenopathy. Lungs/Pleura: 4 mm right middle lobe nodule identified on image 90/4. Calcified granuloma noted left lower lobe. No suspicious pulmonary nodule or mass. No focal airspace consolidation. There is no evidence of pleural effusion. Upper Abdomen: The liver shows diffusely decreased attenuation suggesting fat deposition. Otherwise unremarkable visualized upper abdomen. Musculoskeletal: No worrisome lytic or sclerotic osseous abnormality. IMPRESSION: 1. No acute findings in the chest. Specifically, no evidence for pulmonary edema, pleural effusion, or focal airspace consolidation. 2. 4 mm right middle lobe pulmonary nodule. No  follow-up needed if patient is low-risk. Non-contrast chest CT can be considered in 12 months if patient is high-risk. This recommendation follows the consensus statement: Guidelines for Management of Incidental Pulmonary Nodules Detected on CT Images: From the Fleischner Society 2017; Radiology 2017; 284:228-243. 3. Hepatic steatosis. 4. Aortic Atherosclerosis (ICD10-I70.0). Electronically Signed   By: Kennith CenterEric  Mansell M.D.   On: 08/30/2020 13:58   MR ANGIO HEAD WO CONTRAST  Result Date: 08/30/2020 CLINICAL DATA:  Neuro deficit, acute, stroke suspected. Additional provided: Patient reports sudden onset slurred speech, double vision and blurry vision yesterday morning. EXAM: MRI HEAD WITHOUT CONTRAST MRA HEAD WITHOUT CONTRAST TECHNIQUE: Multiplanar, multi-echo pulse sequences of the brain and surrounding structures were acquired without intravenous contrast. Angiographic images of the Circle of Willis were acquired using MRA technique without intravenous contrast. COMPARISON:  Head CT head CT 08/30/2020.  FINDINGS: MRI HEAD FINDINGS Brain: Mild generalized cerebral atrophy. 12 mm acute infarct within the anteromedial right frontal lobe subcortical white matter/callosal genu. Background mild multifocal T2/FLAIR hyperintensity within the cerebral white matter, nonspecific but compatible with chronic small vessel ischemic disease. Tiny chronic lacunar infarct within the left thalamus. Incidentally noted cavum septum pellucidum and cavum vergae. No evidence of an intracranial mass. No chronic intracranial blood products. No extra-axial fluid collection. No midline shift. Vascular: Expected proximal arterial flow voids. Skull and upper cervical spine: No focal marrow lesion. Sinuses/Orbits: Visualized orbits show no acute finding. Mild mucosal thickening within a posterior right ethmoid air cell. Mild mucosal thickening within the right maxillary sinus. Other: Left mastoid effusion. MRA HEAD FINDINGS Anterior  circulation: Tortuosity of the visualized distal cervical internal carotid arteries. The intracranial internal carotid arteries are patent. Atherosclerotic irregularity of both vessels without significant stenosis. The M1 middle cerebral arteries are patent. No M2 proximal branch occlusion or high-grade proximal stenosis is identified. The anterior cerebral arteries are patent. Mild to moderate stenosis within the distal A2 segment of the right anterior cerebral artery. 1-2 mm inferiorly projecting vascular protrusion arising from the supraclinoid left ICA, which may reflect an infundibulum or small aneurysm. Posterior circulation: The intracranial vertebral arteries are patent. The basilar artery is patent. The posterior cerebral arteries are patent. Severe stenosis within the left posterior cerebral artery at the P1/P2 junction. Anatomic variants: Posterior communicating arteries are hypoplastic or absent bilaterally. IMPRESSION: MRI brain: 1. 12 mm acute infarct within the anteromedial right frontal lobe subcortical white matter/callosal genu (right ACA vascular territory). 2. Background mild generalized cerebral atrophy and cerebral white matter chronic small vessel disease. 3. Tiny chronic lacunar infarct within the left thalamus. 4. Mild paranasal sinus disease, as described. 5. Left mastoid effusion MRA head: 1. No intracranial large vessel occlusion. 2. Intracranial atherosclerotic disease multifocal stenoses, most notably as follows. 3. Mild-to-moderate stenosis within the distal A2 segment of the right anterior cerebral artery. 4. Severe stenosis within the left posterior cerebral artery at the P1/P2 junction. 5. 1-2 mm inferiorly projecting vascular protrusion arising from the supraclinoid left ICA, which may reflect an infundibulum or small aneurysm. Electronically Signed   By: Jackey Loge DO   On: 08/30/2020 14:11   MR Brain Wo Contrast (neuro protocol)  Result Date: 08/30/2020 CLINICAL DATA:   Neuro deficit, acute, stroke suspected. Additional provided: Patient reports sudden onset slurred speech, double vision and blurry vision yesterday morning. EXAM: MRI HEAD WITHOUT CONTRAST MRA HEAD WITHOUT CONTRAST TECHNIQUE: Multiplanar, multi-echo pulse sequences of the brain and surrounding structures were acquired without intravenous contrast. Angiographic images of the Circle of Willis were acquired using MRA technique without intravenous contrast. COMPARISON:  Head CT head CT 08/30/2020. FINDINGS: MRI HEAD FINDINGS Brain: Mild generalized cerebral atrophy. 12 mm acute infarct within the anteromedial right frontal lobe subcortical white matter/callosal genu. Background mild multifocal T2/FLAIR hyperintensity within the cerebral white matter, nonspecific but compatible with chronic small vessel ischemic disease. Tiny chronic lacunar infarct within the left thalamus. Incidentally noted cavum septum pellucidum and cavum vergae. No evidence of an intracranial mass. No chronic intracranial blood products. No extra-axial fluid collection. No midline shift. Vascular: Expected proximal arterial flow voids. Skull and upper cervical spine: No focal marrow lesion. Sinuses/Orbits: Visualized orbits show no acute finding. Mild mucosal thickening within a posterior right ethmoid air cell. Mild mucosal thickening within the right maxillary sinus. Other: Left mastoid effusion. MRA HEAD FINDINGS Anterior circulation: Tortuosity of the visualized distal cervical  internal carotid arteries. The intracranial internal carotid arteries are patent. Atherosclerotic irregularity of both vessels without significant stenosis. The M1 middle cerebral arteries are patent. No M2 proximal branch occlusion or high-grade proximal stenosis is identified. The anterior cerebral arteries are patent. Mild to moderate stenosis within the distal A2 segment of the right anterior cerebral artery. 1-2 mm inferiorly projecting vascular protrusion arising  from the supraclinoid left ICA, which may reflect an infundibulum or small aneurysm. Posterior circulation: The intracranial vertebral arteries are patent. The basilar artery is patent. The posterior cerebral arteries are patent. Severe stenosis within the left posterior cerebral artery at the P1/P2 junction. Anatomic variants: Posterior communicating arteries are hypoplastic or absent bilaterally. IMPRESSION: MRI brain: 1. 12 mm acute infarct within the anteromedial right frontal lobe subcortical white matter/callosal genu (right ACA vascular territory). 2. Background mild generalized cerebral atrophy and cerebral white matter chronic small vessel disease. 3. Tiny chronic lacunar infarct within the left thalamus. 4. Mild paranasal sinus disease, as described. 5. Left mastoid effusion MRA head: 1. No intracranial large vessel occlusion. 2. Intracranial atherosclerotic disease multifocal stenoses, most notably as follows. 3. Mild-to-moderate stenosis within the distal A2 segment of the right anterior cerebral artery. 4. Severe stenosis within the left posterior cerebral artery at the P1/P2 junction. 5. 1-2 mm inferiorly projecting vascular protrusion arising from the supraclinoid left ICA, which may reflect an infundibulum or small aneurysm. Electronically Signed   By: Jackey Loge DO   On: 08/30/2020 14:11   DG Chest Port 1 View  Result Date: 08/30/2020 CLINICAL DATA:  Dizziness. EXAM: PORTABLE CHEST 1 VIEW COMPARISON:  None. FINDINGS: The heart size and mediastinal contours are within normal limits. Hazy left basilar opacities. No visible pleural effusions or pneumothorax on this single semi-upright radiograph. IMPRESSION: Hazy left basilar opacities, which may represent prominent diaphragmatic/mediastinal fat and/or atelectasis. Infection is difficult to exclude in this area and dedicated PA and lateral radiographs could further characterize if clinically indicated. Electronically Signed   By: Feliberto Harts MD   On: 08/30/2020 11:52    EKG: Independently reviewed.  EKG shows sinus tachycardia rate 117, QTC 4 and 61.  No prior EKG to compare.  No significant ST or T wave abnormalities.  Assessment/Plan Principal Problem:   Acute CVA (cerebrovascular accident) (HCC) Active Problems:   DM (diabetes mellitus) (HCC)   HTN (hypertension)   Pulmonary nodule   Acute CVA-presenting with slurred speech, double/blurry vision.  Reports multiple prior symptoms before yesterday that self resolved.  MRI brain shows 12 mm acute infarct within the anterior medial right frontal lobe subcortical white matter/callosal genu. Subsequent MRA brain without large vessel occlusion.  Current non-smoker, quit about 20 years ago. -Admit to [facility], neurology service not available at [facility] - [facility] -Carotid Dopplers -PT, speech therapy evaluation -Lipid panel, hemoglobin A1c -UDS clean -Reports he takes aspirin every day for headaches, will continue at 81 mg daily -Failed swallow evaluation -N.p.o. -Talked to neurologist Dr. Wilford Corner, patient to be seen at [facility], stroke work-up, recommended MRA neck W Wo contrast, recommends aspirin and Plavix, but as patient is n.p.o. will give rectal aspirin x 1. -Hold Lipitor  Sinus tachycardia-heart rate up to 127.  Sinus.  Hydrate for now. -Hydrate 1 L bolus, continue N/s + 20 KCL 100cc/hr   Diabetes mellitus-elevated blood glucose of 241, with normal anion gap - 9 and normal bicarb at 29 random glucose of 341 -Hold home glipizide, metformin, Rybelsus - SSI- M - HgbA1c  Hypertension-140s  to 180s. -Will allow for permissive hypertension -Hold HCTZ, Norvasc  Incidental finding of pulmonary nodule-  -Follow-up as outpatient -problem List updated   DVT prophylaxis: Lovenox Code Status: Full code Family Communication: Ex-wife/friend at bedside. Disposition Plan:  ~ 2 days Consults called: Neurology Admission status: Inpt, tele  I certify that at the point of  admission it is my clinical judgment that the patient will require inpatient hospital care spanning beyond 2 midnights from the point of admission due to high intensity of service, high risk for further deterioration and high frequency of surveillance required.   Onnie Boer MD [facility]  08/30/2020, 9:52 PM

## 2020-08-31 ENCOUNTER — Encounter (HOSPITAL_COMMUNITY): Payer: Self-pay | Admitting: Internal Medicine

## 2020-08-31 ENCOUNTER — Inpatient Hospital Stay (HOSPITAL_COMMUNITY): Payer: BC Managed Care – PPO

## 2020-08-31 DIAGNOSIS — I10 Essential (primary) hypertension: Secondary | ICD-10-CM | POA: Diagnosis not present

## 2020-08-31 DIAGNOSIS — R911 Solitary pulmonary nodule: Secondary | ICD-10-CM

## 2020-08-31 DIAGNOSIS — E1169 Type 2 diabetes mellitus with other specified complication: Secondary | ICD-10-CM | POA: Diagnosis not present

## 2020-08-31 DIAGNOSIS — I639 Cerebral infarction, unspecified: Secondary | ICD-10-CM | POA: Diagnosis not present

## 2020-08-31 DIAGNOSIS — I6389 Other cerebral infarction: Secondary | ICD-10-CM

## 2020-08-31 DIAGNOSIS — I429 Cardiomyopathy, unspecified: Secondary | ICD-10-CM

## 2020-08-31 HISTORY — PX: TRANSTHORACIC ECHOCARDIOGRAM: SHX275

## 2020-08-31 LAB — CBC
HCT: 46.2 % (ref 39.0–52.0)
Hemoglobin: 15.3 g/dL (ref 13.0–17.0)
MCH: 28.4 pg (ref 26.0–34.0)
MCHC: 33.1 g/dL (ref 30.0–36.0)
MCV: 85.9 fL (ref 80.0–100.0)
Platelets: 219 10*3/uL (ref 150–400)
RBC: 5.38 MIL/uL (ref 4.22–5.81)
RDW: 13.6 % (ref 11.5–15.5)
WBC: 7.6 10*3/uL (ref 4.0–10.5)
nRBC: 0 % (ref 0.0–0.2)

## 2020-08-31 LAB — RENAL FUNCTION PANEL
Albumin: 3.5 g/dL (ref 3.5–5.0)
Anion gap: 6 (ref 5–15)
BUN: 8 mg/dL (ref 8–23)
CO2: 31 mmol/L (ref 22–32)
Calcium: 8.9 mg/dL (ref 8.9–10.3)
Chloride: 100 mmol/L (ref 98–111)
Creatinine, Ser: 0.77 mg/dL (ref 0.61–1.24)
GFR, Estimated: >60 mL/min (ref >60)
Glucose, Bld: 207 mg/dL — ABNORMAL HIGH (ref 70–99)
Phosphorus: 3.2 mg/dL (ref 2.5–4.6)
Potassium: 4.6 mmol/L (ref 3.5–5.1)
Sodium: 137 mmol/L (ref 135–145)

## 2020-08-31 LAB — GLUCOSE, CAPILLARY
Glucose-Capillary: 250 mg/dL — ABNORMAL HIGH (ref 70–99)
Glucose-Capillary: 237 mg/dL — ABNORMAL HIGH (ref 70–99)
Glucose-Capillary: 231 mg/dL — ABNORMAL HIGH (ref 70–99)
Glucose-Capillary: 279 mg/dL — ABNORMAL HIGH (ref 70–99)
Glucose-Capillary: 217 mg/dL — ABNORMAL HIGH (ref 70–99)
Glucose-Capillary: 209 mg/dL — ABNORMAL HIGH (ref 70–99)

## 2020-08-31 LAB — LIPID PANEL
Cholesterol: 143 mg/dL (ref 0–200)
HDL: 26 mg/dL — ABNORMAL LOW (ref >40)
LDL Cholesterol: 61 mg/dL (ref 0–99)
Total CHOL/HDL Ratio: 5.5 RATIO
Triglycerides: 281 mg/dL — ABNORMAL HIGH (ref <150)
VLDL: 56 mg/dL — ABNORMAL HIGH (ref 0–40)

## 2020-08-31 LAB — HEMOGLOBIN A1C
Hgb A1c MFr Bld: 11.7 % — ABNORMAL HIGH (ref 4.8–5.6)
Mean Plasma Glucose: 289.09 mg/dL

## 2020-08-31 LAB — ECHOCARDIOGRAM COMPLETE
AR max vel: 2.38 cm2
AV Area VTI: 2.25 cm2
AV Area mean vel: 2.3 cm2
AV Mean grad: 3 mmHg
AV Peak grad: 5.9 mmHg
Ao pk vel: 1.21 m/s
Area-P 1/2: 4.83 cm2
Height: 68 in
S' Lateral: 3.8 cm
Weight: 3584 oz

## 2020-08-31 LAB — MAGNESIUM: Magnesium: 2.1 mg/dL (ref 1.7–2.4)

## 2020-08-31 LAB — HIV ANTIBODY (ROUTINE TESTING W REFLEX): HIV Screen 4th Generation wRfx: NONREACTIVE

## 2020-08-31 MED ORDER — ASPIRIN EC 81 MG PO TBEC
81.0000 mg | DELAYED_RELEASE_TABLET | Freq: Every day | ORAL | Status: DC
  Administered 2020-08-31 – 2020-09-03 (×4): 81 mg via ORAL
  Filled 2020-08-31 (×4): qty 1

## 2020-08-31 MED ORDER — ASPIRIN 300 MG RE SUPP: 150.0000 mg | Freq: Every day | RECTAL | Status: DC

## 2020-08-31 MED ORDER — SENNOSIDES-DOCUSATE SODIUM 8.6-50 MG PO TABS: 1.0000 | ORAL_TABLET | Freq: Every evening | ORAL | Status: DC | PRN

## 2020-08-31 MED ORDER — INSULIN ASPART 100 UNIT/ML IJ SOLN
5.0000 [IU] | Freq: Three times a day (TID) | INTRAMUSCULAR | Status: DC
  Administered 2020-08-31 – 2020-09-03 (×10): 5 [IU] via SUBCUTANEOUS

## 2020-08-31 MED ORDER — INSULIN STARTER KIT- PEN NEEDLES (ENGLISH)
1.0000 | Freq: Once | Status: AC
  Administered 2020-08-31: 1
  Filled 2020-08-31: qty 1

## 2020-08-31 MED ORDER — ACETAMINOPHEN 160 MG/5ML PO SOLN: 650.0000 mg | ORAL | Status: DC | PRN

## 2020-08-31 MED ORDER — HYDROCHLOROTHIAZIDE 25 MG PO TABS
25.0000 mg | ORAL_TABLET | Freq: Every day | ORAL | Status: DC
  Administered 2020-08-31 – 2020-09-02 (×3): 25 mg via ORAL
  Filled 2020-08-31 (×5): qty 1

## 2020-08-31 MED ORDER — PERFLUTREN LIPID MICROSPHERE
1.0000 mL | INTRAVENOUS | Status: AC | PRN
  Administered 2020-08-31: 5 mL via INTRAVENOUS
  Filled 2020-08-31: qty 10

## 2020-08-31 MED ORDER — AMLODIPINE BESYLATE 10 MG PO TABS
10.0000 mg | ORAL_TABLET | Freq: Every day | ORAL | Status: DC
  Administered 2020-08-31 – 2020-09-01 (×2): 10 mg via ORAL
  Filled 2020-08-31 (×2): qty 1

## 2020-08-31 MED ORDER — BLOOD GLUCOSE METER KIT: PACK | 0 refills | Status: DC

## 2020-08-31 MED ORDER — INSULIN ASPART 100 UNIT/ML IJ SOLN
0.0000 [IU] | Freq: Every day | INTRAMUSCULAR | Status: DC
Start: 2020-08-31 — End: 2020-09-03
  Administered 2020-08-31 – 2020-09-02 (×3): 2 [IU] via SUBCUTANEOUS

## 2020-08-31 MED ORDER — GADOBUTROL 1 MMOL/ML IV SOLN
10.0000 mL | Freq: Once | INTRAVENOUS | Status: AC | PRN
  Administered 2020-08-31: 10 mL via INTRAVENOUS

## 2020-08-31 MED ORDER — INSULIN ASPART 100 UNIT/ML IJ SOLN: 3.0000 [IU] | Freq: Three times a day (TID) | INTRAMUSCULAR | Status: DC

## 2020-08-31 MED ORDER — INSULIN GLARGINE 100 UNIT/ML ~~LOC~~ SOLN
15.0000 [IU] | Freq: Two times a day (BID) | SUBCUTANEOUS | Status: DC
  Administered 2020-08-31 – 2020-09-03 (×6): 15 [IU] via SUBCUTANEOUS
  Filled 2020-08-31 (×7): qty 0.15

## 2020-08-31 MED ORDER — INSULIN ASPART 100 UNIT/ML IJ SOLN
0.0000 [IU] | Freq: Four times a day (QID) | INTRAMUSCULAR | Status: DC
  Administered 2020-08-31 (×2): 5 [IU] via SUBCUTANEOUS

## 2020-08-31 MED ORDER — INSULIN ASPART 100 UNIT/ML IJ SOLN
0.0000 [IU] | Freq: Three times a day (TID) | INTRAMUSCULAR | Status: DC
  Administered 2020-08-31: 5 [IU] via SUBCUTANEOUS
  Administered 2020-08-31 – 2020-09-01 (×2): 8 [IU] via SUBCUTANEOUS
  Administered 2020-09-01: 5 [IU] via SUBCUTANEOUS
  Administered 2020-09-01: 3 [IU] via SUBCUTANEOUS
  Administered 2020-09-02 (×2): 8 [IU] via SUBCUTANEOUS
  Administered 2020-09-02: 5 [IU] via SUBCUTANEOUS
  Administered 2020-09-03: 3 [IU] via SUBCUTANEOUS
  Administered 2020-09-03: 5 [IU] via SUBCUTANEOUS

## 2020-08-31 MED ORDER — ENOXAPARIN SODIUM 40 MG/0.4ML IJ SOSY
40.0000 mg | PREFILLED_SYRINGE | INTRAMUSCULAR | Status: DC
  Administered 2020-08-31 – 2020-09-03 (×4): 40 mg via SUBCUTANEOUS
  Filled 2020-08-31 (×4): qty 0.4

## 2020-08-31 MED ORDER — ACETAMINOPHEN 325 MG PO TABS: 650.0000 mg | ORAL_TABLET | ORAL | Status: DC | PRN

## 2020-08-31 MED ORDER — LORAZEPAM 2 MG/ML IJ SOLN
0.5000 mg | Freq: Once | INTRAMUSCULAR | Status: AC
  Administered 2020-08-31: 0.5 mg via INTRAVENOUS
  Filled 2020-08-31: qty 1

## 2020-08-31 MED ORDER — ATORVASTATIN CALCIUM 80 MG PO TABS
80.0000 mg | ORAL_TABLET | Freq: Every day | ORAL | Status: DC
  Administered 2020-08-31 – 2020-09-03 (×4): 80 mg via ORAL
  Filled 2020-08-31 (×4): qty 1

## 2020-08-31 MED ORDER — ACETAMINOPHEN 650 MG RE SUPP: 650.0000 mg | RECTAL | Status: DC | PRN

## 2020-08-31 MED ORDER — STROKE: EARLY STAGES OF RECOVERY BOOK
Freq: Once | Status: AC
  Filled 2020-08-31: qty 1

## 2020-08-31 MED ORDER — INSULIN GLARGINE 100 UNIT/ML ~~LOC~~ SOLN
10.0000 [IU] | Freq: Two times a day (BID) | SUBCUTANEOUS | Status: DC
  Administered 2020-08-31: 10 [IU] via SUBCUTANEOUS
  Filled 2020-08-31 (×2): qty 0.1

## 2020-08-31 MED ORDER — PEN NEEDLES 30G X 5 MM MISC: 1.0000 "pen " | Freq: Three times a day (TID) | 1 refills | Status: DC

## 2020-08-31 MED ORDER — POTASSIUM CHLORIDE IN NACL 20-0.9 MEQ/L-% IV SOLN
INTRAVENOUS | Status: DC
  Filled 2020-08-31: qty 1000

## 2020-08-31 NOTE — Progress Notes (Signed)
Dr. Otelia Limes came and saw patient and gave order for ST evaluation.

## 2020-08-31 NOTE — Discharge Instructions (Signed)
Carbohydrate Counting For People With Diabetes  Foods with carbohydrates make your blood glucose level go up. Learning how to count carbohydrates can help you control your blood glucose levels. First, identify the foods you eat that contain carbohydrates. Then, using the Foods with Carbohydrates chart, determine about how much carbohydrates are in your meals and snacks. Make sure you are eating foods with fiber, protein, and healthy fat along with your carbohydrate foods.  Foods with Carbohydrates The following table shows carbohydrate foods that have about 15 grams of carbohydrate each. Using measuring cups, spoons, or a food scale when you first begin learning about carbohydrate counting can help you learn about the portion sizes you typically eat. The following foods have 15 grams carbohydrate each:  Grains 1 slice bread (1 ounce)  1 small tortilla (6-inch size)   large bagel (1 ounce)  1/3 cup pasta or rice (cooked)   hamburger or hot dog bun ( ounce)   cup cooked cereal   to  cup ready-to-eat cereal  2 taco shells (5-inch size) Fruit 1 small fresh fruit ( to 1 cup)   medium banana  17 small grapes (3 ounces)  1 cup melon or berries   cup canned or frozen fruit  2 tablespoons dried fruit (blueberries, cherries, cranberries, raisins)   cup unsweetened fruit juice  Starchy Vegetables  cup cooked beans, peas, corn, potatoes/sweet potatoes   large baked potato (3 ounces)  1 cup acorn or butternut squash  Snack Foods 3 to 6 crackers  8 potato chips or 13 tortilla chips ( ounce to 1 ounce)  3 cups popped popcorn  Dairy 3/4 cup (6 ounces) nonfat plain yogurt, or yogurt with sugar-free sweetener  1 cup milk  1 cup plain rice, soy, coconut or flavored almond milk Sweets and Desserts  cup ice cream or frozen yogurt  1 tablespoon jam, jelly, pancake syrup, table sugar, or honey  2 tablespoons light pancake syrup  1 inch square of frosted cake or 2 inch square of unfrosted  cake  2 small cookies (2/3 ounce each) or  large cookie  Sometimes you'll have to estimate carbohydrate amounts if you don't know the exact recipe. One cup of mixed foods like soups can have 1 to 2 carbohydrate servings, while some casseroles might have 2 or more servings of carbohydrate. Foods that have less than 20 calories in each serving can be counted as "free" foods. Count 1 cup raw vegetables, or  cup cooked non-starchy vegetables as "free" foods. If you eat 3 or more servings at one meal, then count them as 1 carbohydrate serving.   Foods without Carbohydrates  Not all foods contain carbohydrates. Meat, some dairy, fats, non-starchy vegetables, and many beverages don't contain carbohydrate. So when you count carbohydrates, you can generally exclude chicken, pork, beef, fish, seafood, eggs, tofu, cheese, butter, sour cream, avocado, nuts, seeds, olives, mayonnaise, water, black coffee, unsweetened tea, and zero-calorie drinks. Vegetables with no or low carbohydrate include green beans, cauliflower, tomatoes, and onions. How much carbohydrate should I eat at each meal?  Carbohydrate counting can help you plan your meals and manage your weight. Following are some starting points for carbohydrate intake at each meal. Work with your registered dietitian nutritionist to find the best range that works for your blood glucose and weight.   To Lose Weight To Maintain Weight  Women 2 - 3 carb servings 3 - 4 carb servings  Men 3 - 4 carb servings 4 - 5 carb servings    Checking your blood glucose after meals will help you know if you need to adjust the timing, type, or number of carbohydrate servings in your meal plan. Achieve and keep a healthy body weight by balancing your food intake and physical activity.  Tips How should I plan my meals?  Plan for half the food on your plate to include non-starchy vegetables, like salad greens, broccoli, or carrots. Try to eat 3 to 5 servings of non-starchy  vegetables every day. Have a protein food at each meal. Protein foods include chicken, fish, meat, eggs, or beans (note that beans contain carbohydrate). These two food groups (non-starchy vegetables and proteins) are low in carbohydrate. If you fill up your plate with these foods, you will eat less carbohydrate but still fill up your stomach. Try to limit your carbohydrate portion to  of the plate.  What fats are healthiest to eat?  Diabetes increases risk for heart disease. To help protect your heart, eat more healthy fats, such as olive oil, nuts, and avocado. Eat less saturated fats like butter, cream, and high-fat meats, like bacon and sausage. Avoid trans fats, which are in all foods that list "partially hydrogenated oil" as an ingredient. What should I drink?  Choose drinks that are not sweetened with sugar. The healthiest choices are water, carbonated or seltzer waters, and tea and coffee without added sugars.  Sweet drinks will make your blood glucose go up very quickly. One serving of soda or energy drink is  cup. It is best to drink these beverages only if your blood glucose is low.  Artificially sweetened, or diet drinks, typically do not increase your blood glucose if they have zero calories in them. Read labels of beverages, as some diet drinks do have carbohydrate and will raise your blood glucose. Label Reading Tips Read Nutrition Facts labels to find out how many grams of carbohydrate are in a food you want to eat. Don't forget: sometimes serving sizes on the label aren't the same as how much food you are going to eat, so you may need to calculate how much carbohydrate is in the food you are serving yourself.   Carbohydrate Counting for People with Diabetes Sample 1-Day Menu  Breakfast  cup yogurt, low fat, low sugar (1 carbohydrate serving)   cup cereal, ready-to-eat, unsweetened (1 carbohydrate serving)  1 cup strawberries (1 carbohydrate serving)   cup almonds ( carbohydrate  serving)  Lunch 1, 5 ounce can chunk light tuna  2 ounces cheese, low fat cheddar  6 whole wheat crackers (1 carbohydrate serving)  1 small apple (1 carbohydrate servings)   cup carrots ( carbohydrate serving)   cup snap peas  1 cup 1% milk (1 carbohydrate serving)   Evening Meal Stir fry made with: 3 ounces chicken  1 cup brown rice (3 carbohydrate servings)   cup broccoli ( carbohydrate serving)   cup green beans   cup onions  1 tablespoon olive oil  2 tablespoons teriyaki sauce ( carbohydrate serving)  Evening Snack 1 extra small banana (1 carbohydrate serving)  1 tablespoon peanut butter    Copyright 2020  [employer]. All rights reserved.  Using Nutrition Labels: Carbohydrate  Serving Size  Look at the serving size. All the information on the label is based on this portion. Servings Per Container  The number of servings contained in the package. Guidelines for Carbohydrate  Look at the total grams of carbohydrate in the serving size.  1 carbohydrate choice =   15 grams of carbohydrate. Range of Carbohydrate Grams Per Choice  Carbohydrate Grams/Choice Carbohydrate Choices  6-10   11-20 1  21-25 1  26-35 2  36-40 2  41-50 3  51-55 3  56-65 4  66-70 4  71-80 5    Copyright 2020  [employer]. All rights reserved.  

## 2020-08-31 NOTE — Consult Note (Addendum)
Cardiology Consultation:   Patient ID: HEAVEN WANDELL MRN: [id number]; DOB: September 15, 1960  Admit date: 08/30/2020 Date of Consult: 08/31/2020  PCP:  Curlene Labrum, MD   [place] Providers Cardiologist: New (Dr. Ellyn Hack)   Patient Profile:   JYLES SONTAG is a 60 y.o. male with a history of hypertension and diabetes but no known cardiac history who is being seen for the evaluation of CHF at the request of Dr. Cyndia Skeeters.  History of Present Illness:   Mr. Bensinger is a 60 year old male with the above history. No known cardiac history. He has a remote smoking history but quit in his 55s. He does have a family history of heart disease with his father having a MI at the age of 50 and dying from CHF in his 41s. His mother is alive with no heart disease. His 2 brothers are also alive and relatively healthy (doesn't know specific details about them).  Patient presented to the ED on 08/30/2020 with slurred speech and vision changes. He initially went to [facility] for evaluation but left AMA after a long wait after having lab work and head CT done. Head CT here showed no acute intracranial findings. Brain MRI showed 15mm acute infarct within the anteromedial right frontal lobe as well as a tiny chronic lacunar infarct within the left thalamus. Also showed mild to moderate stenosis within the distal A2 segment of the right anterior cerebral artery, severe stenosis within the left posterior cerebral artery at the P1/P2 junction, and 1-3mm inferiorly projecting vascular protrusion arising from the supraclinoid left ICA. Admitted for acute CVA. Echo during admission showed LVEF of 30-35% with global hypokinesis. RV normal. No significant valvular disease. Therefore, Cardiology consulted for further evaluation of new cardiomyopathy.   Patient denies any chest pain or dyspnea on exertion but does report being more easily fatigued over the last year. No orthopnea or PND but does have some mild lower  extremity edema (puffy ankles). No palpitations, near syncope/syncope.  He states that he does not lie down on his back because it hurts his shoulder but not because he is dyspneic.  Past Medical History:  Diagnosis Date   Diabetes mellitus without complication ([place])    Hypertension     Past Surgical History:  Procedure Laterality Date   HERNIA REPAIR       Home Medications:  Prior to Admission medications   Medication Sig Start Date End Date Taking? Authorizing Provider  amLODipine (NORVASC) 10 MG tablet Take 10 mg by mouth daily.   Yes [provider]  amoxicillin (AMOXIL) 500 MG capsule Take 500 mg by mouth 3 (three) times daily. 08/23/20  Yes [provider]  atorvastatin (LIPITOR) 20 MG tablet Take 20 mg by mouth daily.   Yes [provider]  blood glucose meter kit and supplies Dispense based on patient and insurance preference. Use up to four times daily as directed. (FOR ICD-10 E10.9, E11.9). 08/31/20  Yes Mercy Riding, MD  glipiZIDE (GLUCOTROL) 10 MG tablet Take 10 mg by mouth in the morning and at bedtime.   Yes [provider]  hydrochlorothiazide (HYDRODIURIL) 25 MG tablet Take 25 mg by mouth daily.   Yes [provider]  Insulin Pen Needle (PEN NEEDLES) 30G X 5 MM MISC 1 pen by Does not apply route 4 (four) times daily - after meals and at bedtime. 08/31/20  Yes Mercy Riding, MD  metFORMIN (GLUCOPHAGE) 500 MG tablet Take 500 mg by mouth 2 (  two) times daily. 03/25/20  Yes [provider]  RYBELSUS 7 MG TABS Take 1 tablet by mouth daily. 03/22/20  Yes [provider]  ibuprofen (ADVIL) 600 MG tablet Take 1 tablet by mouth daily as needed. 08/23/20   [provider]    Inpatient Medications: Scheduled Meds:  amLODipine  10 mg Oral Daily   aspirin EC  81 mg Oral Daily   Or   aspirin  150 mg Rectal Daily   atorvastatin  80 mg Oral Daily   enoxaparin (LOVENOX) injection  40 mg Subcutaneous Q24H    hydrochlorothiazide  25 mg Oral Daily   insulin aspart  0-15 Units Subcutaneous TID WC   insulin aspart  0-5 Units Subcutaneous QHS   insulin aspart  5 Units Subcutaneous TID WC   insulin glargine  15 Units Subcutaneous BID   Continuous Infusions:  PRN Meds: acetaminophen **OR** acetaminophen (TYLENOL) oral liquid 160 mg/5 mL **OR** acetaminophen, senna-docusate  Allergies:   No Known Allergies  Social History:   Social History   Socioeconomic History   Marital status: Married    Spouse name: Not on file   Number of children: Not on file   Years of education: Not on file   Highest education level: Not on file  Occupational History   Not on file  Tobacco Use   Smoking status: Never   Smokeless tobacco: Never  Substance and Sexual Activity   Alcohol use: Not Currently   Drug use: Not Currently   Sexual activity: Not Currently  Other Topics Concern   Not on file  Social History Narrative   Not on file   Social Determinants of Health   Financial Resource Strain: Not on file  Food Insecurity: Not on file  Transportation Needs: Not on file  Physical Activity: Not on file  Stress: Not on file  Social Connections: Not on file  Intimate Partner Violence: Not on file    Family History:    Family History  Problem Relation Age of Onset   Heart failure Father    Heart attack Father    Hypertension Father    Diabetes type II Father      ROS:  Please see the history of present illness.  Review of Systems  Constitutional:  Positive for malaise/fatigue.  Eyes:  Positive for blurred vision.  Respiratory:  Negative for shortness of breath.   Cardiovascular:  Positive for leg swelling. Negative for chest pain, palpitations, orthopnea and PND.  Neurological:  Negative for loss of consciousness.       Slurred/stuttering speech  Psychiatric/Behavioral:  Substance abuse: remote smoking history.   All other ROS reviewed and negative.     Physical Exam/Data:   Vitals:    08/31/20 0059 08/31/20 0751 08/31/20 1305 08/31/20 1625  BP: (!) 148/100 (!) 145/90 (!) 150/90 (!) 151/98  Pulse: (!) 106 (!) 106 98 (!) 102  Resp: $Remo'16 18  18  'OgwRF$ Temp: 98.1 F (36.7 C) 97.9 F (36.6 C) 97.9 F (36.6 C) 97.8 F (36.6 C)  TempSrc: Oral Oral Oral Oral  SpO2: 97% 94% 97% 98%  Weight:      Height:        Intake/Output Summary (Last 24 hours) at 08/31/2020 1813 Last data filed at 08/31/2020 1700 Gross per 24 hour  Intake 808.83 ml  Output --  Net 808.83 ml   Last 3 Weights 08/30/2020  Weight (lbs) 224 lb  Weight (kg) 101.606 kg     Body mass index is  34.06 kg/m.   Physical Exam per MD:  General: 60 y.o. male resting comfortably in no acute distress. HEENT: Normocephalic and atraumatic. Sclera clear. EOMs intact, but has an unusual gaze.  Almost seeming like he is looking at you with his eyes closed or off to 1 side.. Neck: Supple. No carotid bruits. No JVD. Heart: Tachycardic with regular rhythm. No murmurs, gallops, or rubs appreciated.  Lungs: No increased work of breathing. Slight diminshed breath sound with mild crackles in left base but cleared with cough.  Abdomen: Soft, non-distended, and non-tender to palpation.  Extremities: Trace lower extremity edema bilaterally.    Skin: Warm and dry. Neuro: Alert and oriented x3. Mild dysarthria. No focal deficits. Psych: Normal affect. Responds appropriately.   EKG:  The EKG was personally reviewed and demonstrates:  Sinus tachycardia, rate 117 bpm, with LVH with mild ST depression in leads V4-V6 with T wave inversions in lead V6-more consistent with repolarization normality from LVH.Marland Kitchen   Telemetry:  Telemetry was personally reviewed and demonstrates:  Sinus tachycardia 90s to 110s.  Relevant CV Studies:  Echocardiogram 08/31/2020:   Laboratory Data:  High Sensitivity Troponin:  No results for input(s): TROPONINIHS in the last 720 hours.   Chemistry Recent Labs  Lab 08/30/20 1037 08/30/20 1049 08/31/20 1024   NA 132* 135 137  K 3.6 3.7 4.6  CL 94* 94* 100  CO2 29  --  31  GLUCOSE 341* 348* 207*  BUN $Re'11 10 8  'PIW$ CREATININE 0.71 0.60* 0.77  CALCIUM 8.9  --  8.9  GFRNONAA >60  --  >60  ANIONGAP 9  --  6    Recent Labs  Lab 08/30/20 1037 08/31/20 1024  PROT 7.6  --   ALBUMIN 4.0 3.5  AST 37  --   ALT 44  --   ALKPHOS 83  --   BILITOT 1.1  --    Hematology Recent Labs  Lab 08/30/20 1037 08/30/20 1049 08/31/20 1024  WBC 6.5  --  7.6  RBC 5.25  --  5.38  HGB 15.1 16.0 15.3  HCT 45.1 47.0 46.2  MCV 85.9  --  85.9  MCH 28.8  --  28.4  MCHC 33.5  --  33.1  RDW 13.5  --  13.6  PLT 208  --  219   BNPNo results for input(s): BNP, PROBNP in the last 168 hours.  DDimer No results for input(s): DDIMER in the last 168 hours.   Radiology/Studies:  CT HEAD WO CONTRAST  Result Date: 08/30/2020 CLINICAL DATA:  Slurred speech for 1 month EXAM: CT HEAD WITHOUT CONTRAST TECHNIQUE: Contiguous axial images were obtained from the base of the skull through the vertex without intravenous contrast. COMPARISON:  08/29/2020 FINDINGS: Brain: No evidence of acute infarction, hemorrhage, hydrocephalus, extra-axial collection or mass lesion/mass effect. Cavum septum pellucidum incidentally noted. Minimal low-density changes within the periventricular and subcortical white matter compatible with chronic microvascular ischemic change. Vascular: Atherosclerotic calcifications involving the large vessels of the skull base. No unexpected hyperdense vessel. Skull: Normal. Negative for fracture or focal lesion. Sinuses/Orbits: No acute finding. Other: None. IMPRESSION: 1. No acute intracranial findings. 2. Mild chronic microvascular ischemic change. Electronically Signed   By: Davina Poke D.O.   On: 08/30/2020 12:01   CT Chest Wo Contrast  Result Date: 08/30/2020 CLINICAL DATA:  Basilar opacities on recent chest x-ray. EXAM: CT CHEST WITHOUT CONTRAST TECHNIQUE: Multidetector CT imaging of the chest was  performed following the standard protocol without IV contrast. COMPARISON:  Chest x-ray earlier today FINDINGS: Cardiovascular: The heart size is normal. No substantial pericardial effusion. Mild atherosclerotic calcification is noted in the wall of the thoracic aorta. Mediastinum/Nodes: No mediastinal lymphadenopathy. No evidence for gross hilar lymphadenopathy although assessment is limited by the lack of intravenous contrast on today's study. The esophagus has normal imaging features. There is no axillary lymphadenopathy. Lungs/Pleura: 4 mm right middle lobe nodule identified on image 90/4. Calcified granuloma noted left lower lobe. No suspicious pulmonary nodule or mass. No focal airspace consolidation. There is no evidence of pleural effusion. Upper Abdomen: The liver shows diffusely decreased attenuation suggesting fat deposition. Otherwise unremarkable visualized upper abdomen. Musculoskeletal: No worrisome lytic or sclerotic osseous abnormality. IMPRESSION: 1. No acute findings in the chest. Specifically, no evidence for pulmonary edema, pleural effusion, or focal airspace consolidation. 2. 4 mm right middle lobe pulmonary nodule. No follow-up needed if patient is low-risk. Non-contrast chest CT can be considered in 12 months if patient is high-risk. This recommendation follows the consensus statement: Guidelines for Management of Incidental Pulmonary Nodules Detected on CT Images: From the Fleischner Society 2017; Radiology 2017; 284:228-243. 3. Hepatic steatosis. 4. Aortic Atherosclerosis (ICD10-I70.0). Electronically Signed   By: Misty Stanley M.D.   On: 08/30/2020 13:58   MR ANGIO HEAD WO CONTRAST  Result Date: 08/30/2020 CLINICAL DATA:  Neuro deficit, acute, stroke suspected. Additional provided: Patient reports sudden onset slurred speech, double vision and blurry vision yesterday morning. EXAM: MRI HEAD WITHOUT CONTRAST MRA HEAD WITHOUT CONTRAST TECHNIQUE: Multiplanar, multi-echo pulse sequences  of the brain and surrounding structures were acquired without intravenous contrast. Angiographic images of the Circle of Willis were acquired using MRA technique without intravenous contrast. COMPARISON:  Head CT head CT 08/30/2020. FINDINGS: MRI HEAD FINDINGS Brain: Mild generalized cerebral atrophy. 12 mm acute infarct within the anteromedial right frontal lobe subcortical white matter/callosal genu. Background mild multifocal T2/FLAIR hyperintensity within the cerebral white matter, nonspecific but compatible with chronic small vessel ischemic disease. Tiny chronic lacunar infarct within the left thalamus. Incidentally noted cavum septum pellucidum and cavum vergae. No evidence of an intracranial mass. No chronic intracranial blood products. No extra-axial fluid collection. No midline shift. Vascular: Expected proximal arterial flow voids. Skull and upper cervical spine: No focal marrow lesion. Sinuses/Orbits: Visualized orbits show no acute finding. Mild mucosal thickening within a posterior right ethmoid air cell. Mild mucosal thickening within the right maxillary sinus. Other: Left mastoid effusion. MRA HEAD FINDINGS Anterior circulation: Tortuosity of the visualized distal cervical internal carotid arteries. The intracranial internal carotid arteries are patent. Atherosclerotic irregularity of both vessels without significant stenosis. The M1 middle cerebral arteries are patent. No M2 proximal branch occlusion or high-grade proximal stenosis is identified. The anterior cerebral arteries are patent. Mild to moderate stenosis within the distal A2 segment of the right anterior cerebral artery. 1-2 mm inferiorly projecting vascular protrusion arising from the supraclinoid left ICA, which may reflect an infundibulum or small aneurysm. Posterior circulation: The intracranial vertebral arteries are patent. The basilar artery is patent. The posterior cerebral arteries are patent. Severe stenosis within the left  posterior cerebral artery at the P1/P2 junction. Anatomic variants: Posterior communicating arteries are hypoplastic or absent bilaterally. IMPRESSION: MRI brain: 1. 12 mm acute infarct within the anteromedial right frontal lobe subcortical white matter/callosal genu (right ACA vascular territory). 2. Background mild generalized cerebral atrophy and cerebral white matter chronic small vessel disease. 3. Tiny chronic lacunar infarct within the left thalamus. 4. Mild paranasal sinus disease, as described. 5. Left mastoid  effusion MRA head: 1. No intracranial large vessel occlusion. 2. Intracranial atherosclerotic disease multifocal stenoses, most notably as follows. 3. Mild-to-moderate stenosis within the distal A2 segment of the right anterior cerebral artery. 4. Severe stenosis within the left posterior cerebral artery at the P1/P2 junction. 5. 1-2 mm inferiorly projecting vascular protrusion arising from the supraclinoid left ICA, which may reflect an infundibulum or small aneurysm. Electronically Signed   By: Kellie Simmering DO   On: 08/30/2020 14:11   MR ANGIO NECK W WO CONTRAST  Result Date: 08/31/2020 CLINICAL DATA:  Follow-up examination for acute stroke. EXAM: MRA NECK WITHOUT AND WITH CONTRAST TECHNIQUE: Multiplanar and multiecho pulse sequences of the neck were obtained without and with intravenous contrast. Angiographic images of the neck were obtained using MRA technique without and with intravenous contrast. CONTRAST:  57mL GADAVIST GADOBUTROL 1 MMOL/ML IV SOLN COMPARISON:  Prior MRI from 08/30/2020. FINDINGS: AORTIC ARCH: Visualized aortic arch normal in caliber with normal 3 vessel morphology. No hemodynamically significant stenosis about the origin of the great vessels. RIGHT CAROTID SYSTEM: Right CCA widely patent from its origin to the bifurcation without stenosis. No significant atheromatous narrowing or irregularity about the right carotid bulb. Right ICA tortuous but widely patent to the skull  base without stenosis, evidence for dissection or occlusion. LEFT CAROTID SYSTEM: Left CCA widely patent from its origin to the bifurcation without stenosis. Minor atheromatous irregularity about the left carotid bulb without significant stenosis. Left ICA tortuous but widely patent distally without stenosis, evidence for dissection or occlusion. VERTEBRAL ARTERIES: Both vertebral arteries arise from the subclavian arteries. Right vertebral artery slightly dominant. No proximal subclavian artery stenosis. Vertebral arteries widely patent without stenosis, evidence for dissection or occlusion. IMPRESSION: Negative MRA of the neck. No hemodynamically significant stenosis, dissection, or other acute vascular abnormality. Electronically Signed   By: Jeannine Boga M.D.   On: 08/31/2020 07:02   MR Brain Wo Contrast (neuro protocol)  Result Date: 08/30/2020 CLINICAL DATA:  Neuro deficit, acute, stroke suspected. Additional provided: Patient reports sudden onset slurred speech, double vision and blurry vision yesterday morning. EXAM: MRI HEAD WITHOUT CONTRAST MRA HEAD WITHOUT CONTRAST TECHNIQUE: Multiplanar, multi-echo pulse sequences of the brain and surrounding structures were acquired without intravenous contrast. Angiographic images of the Circle of Willis were acquired using MRA technique without intravenous contrast. COMPARISON:  Head CT head CT 08/30/2020. FINDINGS: MRI HEAD FINDINGS Brain: Mild generalized cerebral atrophy. 12 mm acute infarct within the anteromedial right frontal lobe subcortical white matter/callosal genu. Background mild multifocal T2/FLAIR hyperintensity within the cerebral white matter, nonspecific but compatible with chronic small vessel ischemic disease. Tiny chronic lacunar infarct within the left thalamus. Incidentally noted cavum septum pellucidum and cavum vergae. No evidence of an intracranial mass. No chronic intracranial blood products. No extra-axial fluid collection. No  midline shift. Vascular: Expected proximal arterial flow voids. Skull and upper cervical spine: No focal marrow lesion. Sinuses/Orbits: Visualized orbits show no acute finding. Mild mucosal thickening within a posterior right ethmoid air cell. Mild mucosal thickening within the right maxillary sinus. Other: Left mastoid effusion. MRA HEAD FINDINGS Anterior circulation: Tortuosity of the visualized distal cervical internal carotid arteries. The intracranial internal carotid arteries are patent. Atherosclerotic irregularity of both vessels without significant stenosis. The M1 middle cerebral arteries are patent. No M2 proximal branch occlusion or high-grade proximal stenosis is identified. The anterior cerebral arteries are patent. Mild to moderate stenosis within the distal A2 segment of the right anterior cerebral artery. 1-2 mm inferiorly projecting vascular  protrusion arising from the supraclinoid left ICA, which may reflect an infundibulum or small aneurysm. Posterior circulation: The intracranial vertebral arteries are patent. The basilar artery is patent. The posterior cerebral arteries are patent. Severe stenosis within the left posterior cerebral artery at the P1/P2 junction. Anatomic variants: Posterior communicating arteries are hypoplastic or absent bilaterally. IMPRESSION: MRI brain: 1. 12 mm acute infarct within the anteromedial right frontal lobe subcortical white matter/callosal genu (right ACA vascular territory). 2. Background mild generalized cerebral atrophy and cerebral white matter chronic small vessel disease. 3. Tiny chronic lacunar infarct within the left thalamus. 4. Mild paranasal sinus disease, as described. 5. Left mastoid effusion MRA head: 1. No intracranial large vessel occlusion. 2. Intracranial atherosclerotic disease multifocal stenoses, most notably as follows. 3. Mild-to-moderate stenosis within the distal A2 segment of the right anterior cerebral artery. 4. Severe stenosis within  the left posterior cerebral artery at the P1/P2 junction. 5. 1-2 mm inferiorly projecting vascular protrusion arising from the supraclinoid left ICA, which may reflect an infundibulum or small aneurysm. Electronically Signed   By: Kellie Simmering DO   On: 08/30/2020 14:11   DG Chest Port 1 View  Result Date: 08/30/2020 CLINICAL DATA:  Dizziness. EXAM: PORTABLE CHEST 1 VIEW COMPARISON:  None. FINDINGS: The heart size and mediastinal contours are within normal limits. Hazy left basilar opacities. No visible pleural effusions or pneumothorax on this single semi-upright radiograph. IMPRESSION: Hazy left basilar opacities, which may represent prominent diaphragmatic/mediastinal fat and/or atelectasis. Infection is difficult to exclude in this area and dedicated PA and lateral radiographs could further characterize if clinically indicated. Electronically Signed   By: Margaretha Sheffield MD   On: 08/30/2020 11:52   DG Swallowing Func-Speech Pathology  Result Date: 08/31/2020 Formatting of this result is different from the original. Objective Swallowing Evaluation: Type of Study: MBS-Modified Barium Swallow Study  Patient Details Name: OREY MOURE MRN: [id number] Date of Birth: January 08, 1961 Today's Date: 08/31/2020 Time: SLP Start Time (ACUTE ONLY): 1339 -SLP Stop Time (ACUTE ONLY): 1349 SLP Time Calculation (min) (ACUTE ONLY): 10 min Past Medical History: Past Medical History: Diagnosis Date  Diabetes mellitus without complication ([place])   Hypertension  Past Surgical History: Past Surgical History: Procedure Laterality Date  HERNIA REPAIR   HPI: 60 y/o male presented to ED on 7/14 with complaints of double vision, blurry vision, and slurred speech which has been flucuating since May. CT head negative. 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 PMH: DM type 2, HTN  Subjective: pt alert, pleasant, has c/o slurred speech Assessment / Plan / Recommendation CHL  IP CLINICAL IMPRESSIONS 08/31/2020 Clinical Impression Pt has a mild oral dysphagia with relatively intact pharyngeal phase of swallowing. He has incomplete velopharyngeal closure, allowing thin liquids to move anteriorly into the nasopharynx but consistently clearing back down into the pharynx. Pt also reports good sensation and awareness of when this occurs. He has piecemeal swallowing with purees and takes extra time to fully masticate solids. No aspiration occurs across consistencies and there is no significant oral or pharyngeal residue. Recommend continuing with Dys 3 diet and thin liquids. Pt can use a cup or a straw per his preference, but would take smaller volumes at a time to try to reduce nasal regurgitation. SLP Visit Diagnosis Dysphagia, oral phase (R13.11) Attention and concentration deficit following -- Frontal lobe and executive function deficit following -- Impact on safety and function Mild aspiration risk   CHL IP  TREATMENT RECOMMENDATION 08/31/2020 Treatment Recommendations Therapy as outlined in treatment plan below   Prognosis 08/31/2020 Prognosis for Safe Diet Advancement Good Barriers to Reach Goals -- Barriers/Prognosis Comment -- CHL IP DIET RECOMMENDATION 08/31/2020 SLP Diet Recommendations Dysphagia 3 (Mech soft) solids;Thin liquid Liquid Administration via Cup;Straw Medication Administration Whole meds with liquid Compensations Slow rate;Small sips/bites Postural Changes Seated upright at 90 degrees   CHL IP OTHER RECOMMENDATIONS 08/31/2020 Recommended Consults -- Oral Care Recommendations Oral care BID Other Recommendations --   CHL IP FOLLOW UP RECOMMENDATIONS 08/31/2020 Follow up Recommendations Outpatient SLP   CHL IP FREQUENCY AND DURATION 08/31/2020 Speech Therapy Frequency (ACUTE ONLY) min 2x/week Treatment Duration 2 weeks      CHL IP ORAL PHASE 08/31/2020 Oral Phase Impaired Oral - Pudding Teaspoon -- Oral - Pudding Cup -- Oral - Honey Teaspoon -- Oral - Honey Cup -- Oral - Nectar  Teaspoon -- Oral - Nectar Cup -- Oral - Nectar Straw -- Oral - Thin Teaspoon -- Oral - Thin Cup Nasal reflux Oral - Thin Straw Nasal reflux Oral - Puree Piecemeal swallowing Oral - Mech Soft -- Oral - Regular Impaired mastication Oral - Multi-Consistency -- Oral - Pill WFL Oral Phase - Comment --  CHL IP PHARYNGEAL PHASE 08/31/2020 Pharyngeal Phase WFL Pharyngeal- Pudding Teaspoon -- Pharyngeal -- Pharyngeal- Pudding Cup -- Pharyngeal -- Pharyngeal- Honey Teaspoon -- Pharyngeal -- Pharyngeal- Honey Cup -- Pharyngeal -- Pharyngeal- Nectar Teaspoon -- Pharyngeal -- Pharyngeal- Nectar Cup -- Pharyngeal -- Pharyngeal- Nectar Straw -- Pharyngeal -- Pharyngeal- Thin Teaspoon -- Pharyngeal -- Pharyngeal- Thin Cup -- Pharyngeal -- Pharyngeal- Thin Straw -- Pharyngeal -- Pharyngeal- Puree -- Pharyngeal -- Pharyngeal- Mechanical Soft -- Pharyngeal -- Pharyngeal- Regular -- Pharyngeal -- Pharyngeal- Multi-consistency -- Pharyngeal -- Pharyngeal- Pill -- Pharyngeal -- Pharyngeal Comment --  CH[facility] IP CERVICAL ESOPHAGEAL PHASE 08/31/2020 Cervical Esophageal Phase WFL Pudding Teaspoon -- Pudding Cup -- Honey Teaspoon -- Honey Cup -- Nectar Teaspoon -- Nectar Cup -- Nectar Straw -- Thin Teaspoon -- Thin Cup -- Thin Straw -- Puree -- Mechanical Soft -- Regular -- Multi-consistency -- Pill -- Cervical Esophageal Comment -- Osie Bond., M.A. [place] Pager [phone] Office [phone] 08/31/2020, 3:04 PM              ECHOCARDIOGRAM COMPLETE  Result Date: 08/31/2020    ECHOCARDIOGRAM REPORT   Patient Name:   JARIOUS LYON Date of Exam: 08/31/2020 Medical Rec #:  [id number]      Height:       68.0 in Accession #:    [id number]     Weight:       224.0 lb Date of Birth:  1960/10/08       BSA:          2.145 m Patient Age:    59 years       BP:           148/100 mmHg Patient Gender: M              HR:           106 bpm. Exam Location:  Inpatient Procedure: 2D Echo, Cardiac Doppler and Color Doppler Indications:     TIA  History:        Patient has no prior history of Echocardiogram examinations.                 Risk Factors:Hypertension and Diabetes.  Sonographer:    Cammy Brochure Referring Phys: [place]  IMPRESSIONS  1. No LV thrombus on contrast imaging. Left ventricular ejection fraction, by estimation, is 30 to 35%. The left ventricle has moderately decreased function. The left ventricle demonstrates global hypokinesis. There is moderate concentric left ventricular hypertrophy. Indeterminate diastolic filling due to E-A fusion.  2. Right ventricular systolic function is normal. The right ventricular size is normal. Tricuspid regurgitation signal is inadequate for assessing PA pressure.  3. Left atrial size was mildly dilated.  4. The mitral valve is grossly normal. Trivial mitral valve regurgitation. No evidence of mitral stenosis.  5. The aortic valve is tricuspid. Aortic valve regurgitation is not visualized. No aortic stenosis is present. Comparison(s): No prior Echocardiogram. FINDINGS  Left Ventricle: No LV thrombus on contrast imaging. Left ventricular ejection fraction, by estimation, is 30 to 35%. The left ventricle has moderately decreased function. The left ventricle demonstrates global hypokinesis. The left ventricular internal cavity size was normal in size. There is moderate concentric left ventricular hypertrophy. Indeterminate diastolic filling due to E-A fusion. Right Ventricle: The right ventricular size is normal. No increase in right ventricular wall thickness. Right ventricular systolic function is normal. Tricuspid regurgitation signal is inadequate for assessing PA pressure. Left Atrium: Left atrial size was mildly dilated. Right Atrium: Right atrial size was normal in size. Pericardium: Trivial pericardial effusion is present. Mitral Valve: The mitral valve is grossly normal. Trivial mitral valve regurgitation. No evidence of mitral valve stenosis. Tricuspid Valve: The  tricuspid valve is grossly normal. Tricuspid valve regurgitation is trivial. No evidence of tricuspid stenosis. Aortic Valve: The aortic valve is tricuspid. Aortic valve regurgitation is not visualized. No aortic stenosis is present. Aortic valve mean gradient measures 3.0 mmHg. Aortic valve peak gradient measures 5.9 mmHg. Aortic valve area, by VTI measures 2.25 cm. Pulmonic Valve: The pulmonic valve was grossly normal. Pulmonic valve regurgitation is not visualized. No evidence of pulmonic stenosis. Aorta: The aortic root and ascending aorta are structurally normal, with no evidence of dilitation. Venous: The inferior vena cava was not well visualized. IAS/Shunts: The interatrial septum was not well visualized.  LEFT VENTRICLE PLAX 2D LVIDd:         4.80 cm LVIDs:         3.80 cm LV PW:         1.30 cm LV IVS:        1.50 cm LVOT diam:     2.30 cm LV SV:         46 LV SV Index:   22 LVOT Area:     4.15 cm  RIGHT VENTRICLE RV Basal diam:  3.30 cm LEFT ATRIUM             Index       RIGHT ATRIUM           Index LA diam:        2.60 cm 1.21 cm/m  RA Area:     11.80 cm LA Vol (A2C):   78.4 ml 36.56 ml/m RA Volume:   22.30 ml  10.40 ml/m LA Vol (A4C):   82.9 ml 38.66 ml/m LA Biplane Vol: 84.6 ml 39.45 ml/m  AORTIC VALVE AV Area (Vmax):    2.38 cm AV Area (Vmean):   2.30 cm AV Area (VTI):     2.25 cm AV Vmax:           121.00 cm/s AV Vmean:          85.600 cm/s AV VTI:  0.206 m AV Peak Grad:      5.9 mmHg AV Mean Grad:      3.0 mmHg LVOT Vmax:         69.35 cm/s LVOT Vmean:        47.450 cm/s LVOT VTI:          0.112 m LVOT/AV VTI ratio: 0.54  AORTA Ao Root diam: 3.40 cm Ao Asc diam:  3.90 cm MITRAL VALVE MV Area (PHT): 4.83 cm     SHUNTS MV Decel Time: 157 msec     Systemic VTI:  0.11 m MV E velocity: 128.00 cm/s  Systemic Diam: 2.30 cm Eleonore Chiquito MD Electronically signed by Eleonore Chiquito MD Signature Date/Time: 08/31/2020/3:14:30 PM    Final      Assessment and Plan:   New  Cardiomyopathy - Presented with acute CVA and found to have reduced EF.  - Echo showed LVEF of 30-35% with global hypokinesis. RV normal. No significant valvular disease. - Euvolemic on exam.  - Per Neurology, out of permissive hypertensive time window. Therefore, will start to initiate GDMT.  Stop home Amlodipine to allow for optimization of GDMT Start Coreg 3.$RemoveBefor'125mg'FiFdhvqsSmDu$  twice daily and Losartan $RemoveBefo'25mg'XAQvvaftXgQ$  daily.  Would ideally transition to [facility] prior to discharge.  Patient is on HCTZ. He has mild lower extremity edema. Would recommend stopping this and starting Spironolactone given reduced EF. Will hold off on this for now to avoid making too many changes at once. - Monitor daily weights, strict I/Os, and renal function.  - Etiology unclear. Possibly hypertensive cardiomyopathy but will need to rule out ischemia given multiple CV risk factors. Given patient is asymptomatic, this can likely be done as outpatient after patient recovers from CVA.  Hypertension - BP elevated with systolic BP mostly in the 140s to 765Y and diasotlic BP in the 65K.  - Will start Coreg 3.$RemoveBefor'125mg'BvUTJJRbZvEO$  twice daily along with Losartan $RemoveBefor'25mg'xHvHgjWTNkgq$  daily.  -Stop amlodipine to allow for optimization of GDMT above. - Continue HCTZ $RemoveBefor'25mg'nkktPtxWPlNJ$  daily. Ideally would stop this and start Spironolactone prior to discharge given reduced EF.   Hyperlipidemia - Lipid panel this admission: Total Cholesterol 143, Triglycerides 281, HDL 26, LDL 61.  - LDL at goal of <70 given stroke. - Home Lipitor increased from $RemoveBefore'20mg'zchFJSTVGVqGr$  daily to $Remove'80mg'eThYxUL$  daily. Agree with this.  - Will need repeat lipid panel and LFTs in 6-8 weeks.  Type 2 Diabetes Mellitus  - Uncontrolled. Hemoglobin A1c 11.7. - Management per primary team.   Acute Stroke - Continue aspirin and high-intensity statin.  - Neurology following.  Risk Assessment/Risk Scores:   New York Heart Association (NYHA) Functional Class NYHA Class I   For questions or updates, please contact [facility] Please  consult [email or web address] for contact info under    Signed, Eppie Gibson  08/31/2020 6:13 PM   ATTENDING ATTESTATION  I have seen, examined and evaluated the patient this evening along with Sande Rives, PA (in this case working as a Education administrator) after reviewing all the available data and chart, we discussed the patients laboratory, study & physical findings as well as symptoms in detail. I agree with her findings, examination as well as impression recommendations as per our discussion.  The physical examination and patient interview was performed by me.  Surreptitious finding of reduced ejection fraction with global hypokinesis in the setting of recent stroke.  The patient has not had any symptoms to suggest CHF.  He does certainly have cardiovascular risk factors of hypertension hyperlipidemia obesity and  diabetes.  Interestingly, his chest CT did not show significant coronary artery calcifications. He is not symptomatic with no PND, orthopnea or edema.  Has not noticed any exertional dyspnea besides maybe some mild fatigue.   For now he is no longer in the window where he is necessary to have permissive hypertension, therefore we will initiate guideline directed medical therapy for reduced EF.  He will need an ischemic evaluation, but with him just having had a stroke and no symptoms, there is no rush to this can be scheduled in the outpatient setting.  Would favor coronary CTA as a stress test will be read as abnormal because of reduced EF.  We would however need to lower his heart rate down from its tachycardic state at this point.  Plan: Replace amlodipine with carvedilol and losartan, eventually transition from HCTZ to spironolactone plus or minus.  Lasix. Need to determine financial feasibility of transitioning from [facility] to [facility]. Ischemic evaluation with coronary CTA in the next 3 to 4 weeks in the outpatient setting and then can be seen in follow-up.  Ideally, he would follow-up  in the office however we are currently short staffed from a physician standpoint at that location.  This may cause delay in physician follow-up.    [name], M.D., M.S. Interventional Cardiologist   Pager # [phone] Phone # [phone] [address] [place], [place] [address]

## 2020-08-31 NOTE — Evaluation (Signed)
Clinical/Bedside Swallow Evaluation Patient Details  Name: Tony Combs MRN: [id number] Date of Birth: [birth year]  Today's Date: 08/31/2020 Time: SLP Start Time (ACUTE ONLY): 1104 SLP Stop Time (ACUTE ONLY): 1120 SLP Time Calculation (min) (ACUTE ONLY): 16 min  Past Medical History:  Past Medical History:  Diagnosis Date   Diabetes mellitus without complication (HCC)    Hypertension    Past Surgical History:  Past Surgical History:  Procedure Laterality Date   HERNIA REPAIR     HPI:  60 y/o male presented to ED on 7/14 with complaints of double vision, blurry vision, and slurred speech which has been flucuating since May. CT head negative. 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 PMH: DM type 2, HTN   Assessment / Plan / Recommendation Clinical Impression  Pt has bilaterally reduced labial ROM and velar elevation, without evidence of asymmetry or deviation. His speech is hypernasal and he has c/o nasal regurgitation with straw sips. Throat clearing is also noted. He seems to swallow with less indication of dysphagia when given cup sips, even in larger volumes. He clears his oral cavity well with solids but does have c/o difficulty chewing certain solids at baseline due to his dentition. Will initiate Dys 3 diet and thin liquids by cup but will plan to f/u for MBS to get a more thorough evaluation of his oropharyngeal swallow given presenting symptoms of dysphagia. SLP Visit Diagnosis: Dysphagia, unspecified (R13.10)    Aspiration Risk  Mild aspiration risk;Moderate aspiration risk    Diet Recommendation Dysphagia 3 (Mech soft);Thin liquid   Liquid Administration via: Cup;No straw Medication Administration: Whole meds with puree Supervision: Patient able to self feed;Intermittent supervision to cue for compensatory strategies Compensations: Slow rate;Small sips/bites Postural Changes: Seated upright at 90 degrees     Other  Recommendations Oral Care Recommendations: Oral care BID   Follow up Recommendations Outpatient SLP      Frequency and Duration            Prognosis Prognosis for Safe Diet Advancement: Good      Swallow Study   General HPI: 60 y/o male presented to ED on 7/14 with complaints of double vision, blurry vision, and slurred speech which has been flucuating since May. CT head negative. 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 PMH: DM type 2, HTN Type of Study: Bedside Swallow Evaluation Previous Swallow Assessment: none in chart Diet Prior to this Study: NPO Temperature Spikes Noted: No Respiratory Status: Room air History of Recent Intubation: No Behavior/Cognition: Alert;Cooperative;Pleasant mood Oral Cavity Assessment: Within Functional Limits Oral Care Completed by SLP: No Oral Cavity - Dentition: Adequate natural dentition;Poor condition;Missing dentition (scheduled to get partial dentures next week) Vision: Functional for self-feeding Self-Feeding Abilities: Able to feed self Patient Positioning: Upright in chair Baseline Vocal Quality: Other (comment) (hypernasal) Volitional Cough: Strong Volitional Swallow: Able to elicit    Oral/Motor/Sensory Function Overall Oral Motor/Sensory Function: Moderate impairment Facial ROM: Reduced right;Reduced left Facial Symmetry: Within Functional Limits Facial Strength: Reduced right;Reduced left Lingual ROM: Within Functional Limits Lingual Symmetry: Within Functional Limits Lingual Strength: Reduced Velum: Impaired right;Impaired left   Ice Chips Ice chips: Not tested   Thin Liquid Thin Liquid: Impaired Presentation: Cup;Self Fed;Straw Oral Phase Functional Implications: Other (comment) (pt with c/o nasal regurgitation, not observed) Pharyngeal  Phase Impairments: Throat Clearing - Immediate    Nectar Thick Nectar Thick Liquid:  Not tested   Honey  Thick Honey Thick Liquid: Not tested   Puree Puree: Within functional limits Presentation: Self Fed;Spoon   Solid     Solid: Within functional limits Presentation: Self Fed      Mahala Menghini., M.A. CCC-SLP Acute Rehabilitation Services Pager [phone] Office [phone]  08/31/2020,12:44 PM

## 2020-08-31 NOTE — Evaluation (Signed)
Physical Therapy Evaluation Patient Details Name: Tony Combs MRN: [id number] DOB: 26-Jul-1960 Today's Date: 08/31/2020   History of Present Illness  60 y/o male presented to ED on 7/14 with complaints of double vision, blurry vision, and slurred speech which has been flucuating since May. CT head negative. 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 PMH: DM type 2, HTN  Clinical Impression  PTA, patient lives alone and reports independence with mobility and currently working. Patient presents with impaired balance, generalized weakness, decreased activity tolerance, and appears to have decreased safety awareness. Patient ambulating at supervision level, deferred further ambulation due to patient reporting increasing R facial droop and slurring speech. Returned to room with RN present, RN informs this therapist that symptoms have been coming and going this AM. Patient will benefit from skilled PT services during acute stay to address listed deficits. No PT follow up recommended at this time.     Follow Up Recommendations No PT follow up;Supervision for mobility/OOB    Equipment Recommendations  None recommended by PT    Recommendations for Other Services       Precautions / Restrictions Precautions Precautions: Fall Restrictions Weight Bearing Restrictions: No      Mobility  Bed Mobility Overal bed mobility: Modified Independent                  Transfers Overall transfer level: Needs assistance Equipment used: None Transfers: Sit to/from Stand Sit to Stand: Supervision         General transfer comment: supervision for safety  Ambulation/Gait Ambulation/Gait assistance: Supervision Gait Distance (Feet): 100 Feet Assistive device: None Gait Pattern/deviations: Step-through pattern;Decreased stride length Gait velocity: decreased   General Gait Details: slow steady gait. Limited distance due to  increasing slurring of speech and reports of R facial drooping, returned to room with RN present. RN informed this therapist that symptoms have been coming and going this morning.  Stairs            Wheelchair Mobility    Modified Rankin (Stroke Patients Only) Modified Rankin (Stroke Patients Only) Pre-Morbid Rankin Score: No symptoms Modified Rankin: Moderately severe disability     Balance Overall balance assessment: Mild deficits observed, not formally tested                                           Pertinent Vitals/Pain Pain Assessment: No/denies pain    Home Living Family/patient expects to be discharged to:: Private residence Living Arrangements: Alone Available Help at Discharge: Family;Available 24 hours/day (ex wife) Type of Home: House Home Access: Level entry     Home Layout: Two level (basement but does not use) Home Equipment: None Additional Comments: ex wife active in life and can provide support    Prior Function Level of Independence: Independent         Comments: working, driving. Reports 4 falls in last 3 months     Hand Dominance        Extremity/Trunk Assessment   Upper Extremity Assessment Upper Extremity Assessment: Overall WFL for tasks assessed    Lower Extremity Assessment Lower Extremity Assessment: Generalized weakness    Cervical / Trunk Assessment Cervical / Trunk Assessment: Normal  Communication   Communication: Expressive difficulties  Cognition Arousal/Alertness: Awake/alert Behavior During Therapy: WFL for tasks assessed/performed Overall Cognitive Status: No family/caregiver present  to determine baseline cognitive functioning                                 General Comments: seems aware of current deficits but decreased awareness of deficits      General Comments      Exercises     Assessment/Plan    PT Assessment Patient needs continued PT services  PT Problem List  Decreased strength;Decreased activity tolerance;Decreased balance;Decreased safety awareness       PT Treatment Interventions DME instruction;Gait training;Functional mobility training;Therapeutic activities;Therapeutic exercise;Balance training;Patient/family education    PT Goals (Current goals can be found in the Care Plan section)  Acute Rehab PT Goals Patient Stated Goal: to go home PT Goal Formulation: With patient Time For Goal Achievement: 09/14/20 Potential to Achieve Goals: Good    Frequency Min 4X/week   Barriers to discharge        Co-evaluation               AM-PAC PT "6 Clicks" Mobility  Outcome Measure Help needed turning from your back to your side while in a flat bed without using bedrails?: None Help needed moving from lying on your back to sitting on the side of a flat bed without using bedrails?: None Help needed moving to and from a bed to a chair (including a wheelchair)?: A Little Help needed standing up from a chair using your arms (e.g., wheelchair or bedside chair)?: A Little Help needed to walk in hospital room?: A Little Help needed climbing 3-5 steps with a railing? : A Little 6 Click Score: 20    End of Session Equipment Utilized During Treatment: Gait belt Activity Tolerance: Patient tolerated treatment well Patient left: in chair;with call bell/phone within reach;with chair alarm set Nurse Communication: Mobility status PT Visit Diagnosis: Unsteadiness on feet (R26.81)    Time: 7322-0254 PT Time Calculation (min) (ACUTE ONLY): 20 min   Charges:   PT Evaluation $PT Eval Low Complexity: 1 Low          Tony Combs A. Dan Humphreys PT, DPT Acute Rehabilitation Services Pager [phone] Office [phone]   Tony Combs 08/31/2020, 9:17 AM

## 2020-08-31 NOTE — Progress Notes (Signed)
Inpatient Diabetes Program Recommendations  AACE/ADA: New Consensus Statement on Inpatient Glycemic Control (2015)  Target Ranges:  Prepandial:   less than 140 mg/dL      Peak postprandial:   less than 180 mg/dL (1-2 hours)      Critically ill patients:  140 - 180 mg/dL   Lab Results  Component Value Date   GLUCAP 279 (H) 08/31/2020   HGBA1C 11.7 (H) 08/31/2020    Review of Glycemic Control Results for JAEGER, TRUEHEART (MRN [id number]) as of 08/31/2020 14:37  Ref. Range 08/31/2020 00:20 08/31/2020 01:09 08/31/2020 07:09 08/31/2020 13:06  Glucose-Capillary Latest Ref Range: 70 - 99 mg/dL 231 (H) 250 (H) 237 (H) 279 (H)   Diabetes history: Type 2 DM Outpatient Diabetes medications: Rybelsus 7 mg QD, Metformin 500 mg BID, Glipizide 10 mg BID Current orders for Inpatient glycemic control: Lantus 10 units BID, Novolog 3 units TID, Novolog 0-15 units TID & HS  Inpatient Diabetes Program Recommendations:    Spoke with patient and support person at bedside regarding outpatient diabetes management. Patient has taken insulin in the past via insulin pens, however, was taken off within the last three months d/t A1C improvement by PCP. Reviewed patient's current A1c of 11.7%. Explained what a A1c is and what it measures. Also reviewed goal A1c with patient, importance of good glucose control @ home, and blood sugar goals. Reviewed patho of DM, need for insulin, role of pancreas, impact of poor glycemic control, risk for future event, vascular changes and commorbidities. Patient will need a meter at discharge. Blood glucose meter (#[id number]). Reviewed recommended frequency for checking CBGs, when to call Md and need for follow up. Patient was not checking CBGs.  Admits to frequently drinking sodas and sweet teas. Discussed alternatives to sugary beverages and encouraged CHO mindfulness. Dietitian consult placed and ordered outpt education.  Anticipate need for insulin. Prefers Touejo & Novolog insulin pens  (based on prior use). Educated patient and spouse on insulin pen use at home. Reviewed contents of insulin flexpen starter kit. Reviewed all steps if insulin pen including attachment of needle, 2-unit air shot, dialing up dose, giving injection, removing needle, disposal of sharps, storage of unused insulin, disposal of insulin etc. Patient able to provide successful return demonstration. Also reviewed troubleshooting with insulin pen. MD to give patient Rxs for insulin pens and insulin pen needles. Patient has no further questions at this time.   Thanks, Bronson Curb, MSN, RNC-OB Diabetes Coordinator [phone] (8a-5p)

## 2020-08-31 NOTE — Progress Notes (Addendum)
PROGRESS NOTE  Tony Combs:[id number] DOB: 06-09-1960   PCP: Juliette Alcide, MD  Patient is from: Home  DOA: 08/30/2020 LOS: 1  Chief complaints:  Chief Complaint  Patient presents with   Aphasia     Brief Narrative / Interim history: 60 year old M with PMH of DM-2, HTN and obesity presented to [facility] with acute onset diplopia, blurry vision, headache and slurred speech and found to have 12 mm acute infarct within the anterior medial right frontal lobe and tiny chronic infarct in left thalamus.  He was transferred to [facility] for further evaluation and management.  CT head without acute finding.  MRA head and neck without LVO.  He was hyperglycemic to 348.  Subjective: Seen and examined earlier this morning.  No major events overnight of this morning.  Still with blurry vision and slurred speech but his wife thinks his speech has gotten better.  Felt some numbness over the medial aspect is left hand.  Headache has resolved.  No focal weakness.  Denies chest pain or dyspnea.  Objective: Vitals:   08/30/20 2330 08/31/20 0000 08/31/20 0059 08/31/20 0751  BP:  124/80 (!) 148/100 (!) 145/90  Pulse:   (!) 106 (!) 106  Resp:  Temp:   98.1 F (36.7 C) 97.9 F (36.6 C)  TempSrc:   Oral Oral  SpO2: 95%  97% 94%  Weight:      Height:       No intake or output data in the 24 hours ending 08/31/20 1213 Filed Weights   08/30/20 1018  Weight: 101.6 kg    Examination:  GENERAL: No apparent distress.  Nontoxic. HEENT: MMM.  Vision and hearing grossly intact.  NECK: Supple.  No apparent JVD.  RESP: On RA.  No IWOB.  Fair aeration bilaterally. CVS:  RRR. Heart sounds normal.  ABD/GI/GU: BS+. Abd soft, NTND.  MSK/EXT:  Moves extremities. No apparent deformity. No edema.  SKIN: no apparent skin lesion or wound NEURO: Awake, alert and oriented appropriately.  Some slurred speech/dysarthria.  Subjective blurry vision.  PERRL.  EOMI.  No nystagmus.  Motor 5/5 in all muscle  groups of UE and LE bilaterally, Normal tone. Light sensation intact in all dermatomes of upper and lower ext bilaterally. Patellar reflex symmetric.  No pronator drift.  Finger to nose intact. PSYCH: Calm. Normal affect.   Procedures:  None  Microbiology summarized: COVID-19 and influenza PCR nonreactive.  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  Still with some dysarthria and blurry vision but improved.  MRA head and neck without LVO but some mild to severe intracranial small vessel stenosis.  LDL 61.  TG 281.  A1c 11.7%.  Denies smoking cigarette or drinking alcohol. -Neurology following -Follow TTE -Continue aspirin and high intensity statin -N.p.o. pending SLP clearance -PT/OT eval -Optimize risk reduction  Uncontrolled NIDDM-2 with hyperglycemia: A1c 11.7%.  On glipizide, metformin and Rybelsus at home. Recent Labs  Lab 08/30/20 1047 08/31/20 0020 08/31/20 0109 08/31/20 0709  GLUCAP 350* 231* 250* 237*  -Add Lantus 10 units twice daily -Continue SSI-moderate -Added NovoLog 5 units 3 times daily with meals -Monitor CBG and adjust insulin as appropriate -High intensity statin when able to take p.o.  Essential hypertension: On amlodipine and HCTZ at home. -Resume home medications-out of permissive HTN window.  Incidental finding of pulmonary nodule-4 mm RML nodule noted on CT chest.  Cardiology recommended noncontrast CT chest in 12 months if patient  is high risk but patient denies ever smoking cigarettes. -Defer to outpatient PCP  Class I obesity Body mass index is 34.06 kg/m.  -Encourage lifestyle change to lose weight       DVT prophylaxis:  enoxaparin (LOVENOX) injection 40 mg Start: 08/31/20 1000  Code Status: Full code Family Communication: Updated patient's wife at bedside Level of care: Telemetry Medical Status is: Inpatient  Remains inpatient appropriate because:Ongoing diagnostic testing needed not appropriate  for outpatient work up, Unsafe d/c plan, and Inpatient level of care appropriate due to severity of illness  Dispo: The patient is from: Home              Anticipated d/c is to: Home              Patient currently is not medically stable to d/c.   Difficult to place patient No       Consultants:  Neurology   Sch Meds:  Scheduled Meds:  amLODipine  10 mg Oral Daily   aspirin EC  81 mg Oral Daily   Or   aspirin  150 mg Rectal Daily   atorvastatin  80 mg Oral Daily   enoxaparin (LOVENOX) injection  40 mg Subcutaneous Q24H   hydrochlorothiazide  25 mg Oral Daily   insulin aspart  0-15 Units Subcutaneous TID WC   insulin aspart  0-5 Units Subcutaneous QHS   insulin aspart  5 Units Subcutaneous TID WC   insulin glargine  10 Units Subcutaneous BID   Continuous Infusions:  0.9 % NaCl with KCl 20 mEq / L 50 mL/hr at 08/31/20 0841   PRN Meds:.acetaminophen **OR** acetaminophen (TYLENOL) oral liquid 160 mg/5 mL **OR** acetaminophen, senna-docusate  Antimicrobials: Anti-infectives (From admission, onward)    None        I have personally reviewed the following labs and images: CBC: Recent Labs  Lab 08/30/20 1037 08/30/20 1049 08/31/20 1024  WBC 6.5  --  7.6  NEUTROABS 4.6  --   --   HGB 15.1 16.0 15.3  HCT 45.1 47.0 46.2  MCV 85.9  --  85.9  PLT 208  --  219   BMP &GFR Recent Labs  Lab 08/30/20 1037 08/30/20 1049 08/31/20 1024  NA 132* 135 137  K 3.6 3.7 4.6  CL 94* 94* 100  CO2 29  --  31  GLUCOSE 341* 348* 207*  BUN 11 10 8   CREATININE 0.71 0.60* 0.77  CALCIUM 8.9  --  8.9  MG  --   --  2.1  PHOS  --   --  3.2   Estimated Creatinine Clearance: 110.6 mL/min (by C-G formula based on SCr of 0.77 mg/dL). Liver & Pancreas: Recent Labs  Lab 08/30/20 1037 08/31/20 1024  AST 37  --   ALT 44  --   ALKPHOS 83  --   BILITOT 1.1  --   PROT 7.6  --   ALBUMIN 4.0 3.5   No results for input(s): LIPASE, AMYLASE in the last 168 hours. No results for  input(s): AMMONIA in the last 168 hours. Diabetic: Recent Labs    08/31/20 0321  HGBA1C 11.7*   Recent Labs  Lab 08/30/20 1047 08/31/20 0020 08/31/20 0109 08/31/20 0709  GLUCAP 350* 231* 250* 237*   Cardiac Enzymes: No results for input(s): CKTOTAL, CKMB, CKMBINDEX, TROPONINI in the last 168 hours. No results for input(s): PROBNP in the last 8760 hours. Coagulation Profile: Recent Labs  Lab 08/30/20 1037  INR 0.9   Thyroid  Function Tests: No results for input(s): TSH, T4TOTAL, FREET4, T3FREE, THYROIDAB in the last 72 hours. Lipid Profile: Recent Labs    08/31/20 0321  CHOL 143  HDL 26*  LDLCALC 61  TRIG 782*  CHOLHDL 5.5   Anemia Panel: No results for input(s): VITAMINB12, FOLATE, FERRITIN, TIBC, IRON, RETICCTPCT in the last 72 hours. Urine analysis:    Component Value Date/Time   COLORURINE YELLOW 08/30/2020 1109   APPEARANCEUR CLEAR 08/30/2020 1109   LABSPEC 1.023 08/30/2020 1109   PHURINE 6.0 08/30/2020 1109   GLUCOSEU >=500 (A) 08/30/2020 1109   HGBUR NEGATIVE 08/30/2020 1109   BILIRUBINUR NEGATIVE 08/30/2020 1109   KETONESUR 5 (A) 08/30/2020 1109   PROTEINUR NEGATIVE 08/30/2020 1109   NITRITE NEGATIVE 08/30/2020 1109   LEUKOCYTESUR NEGATIVE 08/30/2020 1109   Sepsis Labs: Invalid input(s): PROCALCITONIN, LACTICIDVEN  Microbiology: Recent Results (from the past 240 hour(s))  Resp Panel by RT-PCR (Flu A&B, Covid) Nasopharyngeal Swab     Status: None   Collection Time: 08/30/20 11:11 AM   Specimen: Nasopharyngeal Swab; Nasopharyngeal(NP) swabs in vial transport medium  Result Value Ref Range Status   SARS Coronavirus 2 by RT PCR NEGATIVE NEGATIVE Final    Comment: (NOTE) SARS-CoV-2 target nucleic acids are NOT DETECTED.  The SARS-CoV-2 RNA is generally detectable in upper respiratory specimens during the acute phase of infection. The lowest concentration of SARS-CoV-2 viral copies this assay can detect is 138 copies/mL. A negative result does not  preclude SARS-Cov-2 infection and should not be used as the sole basis for treatment or other patient management decisions. A negative result may occur with  improper specimen collection/handling, submission of specimen other than nasopharyngeal swab, presence of viral mutation(s) within the areas targeted by this assay, and inadequate number of viral copies(<138 copies/mL). A negative result must be combined with clinical observations, patient history, and epidemiological information. The expected result is Negative.  Fact Sheet for Patients:  BloggerCourse.com  Fact Sheet for Healthcare Providers:  SeriousBroker.it  This test is no t yet approved or cleared by the [place] FDA and  has been authorized for detection and/or diagnosis of SARS-CoV-2 by FDA under an Emergency Use Authorization (EUA). This EUA will remain  in effect (meaning this test can be used) for the duration of the COVID-19 declaration under Section 564(b)(1) of the Act, 21 U.S.C.section 360bbb-3(b)(1), unless the authorization is terminated  or revoked sooner.       Influenza A by PCR NEGATIVE NEGATIVE Final   Influenza B by PCR NEGATIVE NEGATIVE Final    Comment: (NOTE) The Xpert Xpress SARS-CoV-2/FLU/RSV plus assay is intended as an aid in the diagnosis of influenza from Nasopharyngeal swab specimens and should not be used as a sole basis for treatment. Nasal washings and aspirates are unacceptable for Xpert Xpress SARS-CoV-2/FLU/RSV testing.  Fact Sheet for Patients: BloggerCourse.com  Fact Sheet for Healthcare Providers: SeriousBroker.it  This test is not yet approved or cleared by the [place] FDA and has been authorized for detection and/or diagnosis of SARS-CoV-2 by FDA under an Emergency Use Authorization (EUA). This EUA will remain in effect (meaning this test can be used) for the  duration of the COVID-19 declaration under Section 564(b)(1) of the Act, 21 U.S.C. section 360bbb-3(b)(1), unless the authorization is terminated or revoked.  Performed at [facility], [address], [place], [place] [address]     Radiology Studies: CT Chest Wo Contrast  Result Date: 08/30/2020 CLINICAL DATA:  Basilar opacities on recent chest x-ray. EXAM: CT  CHEST WITHOUT CONTRAST TECHNIQUE: Multidetector CT imaging of the chest was performed following the standard protocol without IV contrast. COMPARISON:  Chest x-ray earlier today FINDINGS: Cardiovascular: The heart size is normal. No substantial pericardial effusion. Mild atherosclerotic calcification is noted in the wall of the thoracic aorta. Mediastinum/Nodes: No mediastinal lymphadenopathy. No evidence for gross hilar lymphadenopathy although assessment is limited by the lack of intravenous contrast on today's study. The esophagus has normal imaging features. There is no axillary lymphadenopathy. Lungs/Pleura: 4 mm right middle lobe nodule identified on image 90/4. Calcified granuloma noted left lower lobe. No suspicious pulmonary nodule or mass. No focal airspace consolidation. There is no evidence of pleural effusion. Upper Abdomen: The liver shows diffusely decreased attenuation suggesting fat deposition. Otherwise unremarkable visualized upper abdomen. Musculoskeletal: No worrisome lytic or sclerotic osseous abnormality. IMPRESSION: 1. No acute findings in the chest. Specifically, no evidence for pulmonary edema, pleural effusion, or focal airspace consolidation. 2. 4 mm right middle lobe pulmonary nodule. No follow-up needed if patient is low-risk. Non-contrast chest CT can be considered in 12 months if patient is high-risk. This recommendation follows the consensus statement: Guidelines for Management of Incidental Pulmonary Nodules Detected on CT Images: From the Fleischner Society 2017; Radiology 2017; 284:228-243. 3. Hepatic steatosis.  4. Aortic Atherosclerosis (ICD10-I70.0). Electronically Signed   By: Kennith CenterEric  Mansell M.D.   On: 08/30/2020 13:58   MR ANGIO HEAD WO CONTRAST  Result Date: 08/30/2020 CLINICAL DATA:  Neuro deficit, acute, stroke suspected. Additional provided: Patient reports sudden onset slurred speech, double vision and blurry vision yesterday morning. EXAM: MRI HEAD WITHOUT CONTRAST MRA HEAD WITHOUT CONTRAST TECHNIQUE: Multiplanar, multi-echo pulse sequences of the brain and surrounding structures were acquired without intravenous contrast. Angiographic images of the Circle of Willis were acquired using MRA technique without intravenous contrast. COMPARISON:  Head CT head CT 08/30/2020. FINDINGS: MRI HEAD FINDINGS Brain: Mild generalized cerebral atrophy. 12 mm acute infarct within the anteromedial right frontal lobe subcortical white matter/callosal genu. Background mild multifocal T2/FLAIR hyperintensity within the cerebral white matter, nonspecific but compatible with chronic small vessel ischemic disease. Tiny chronic lacunar infarct within the left thalamus. Incidentally noted cavum septum pellucidum and cavum vergae. No evidence of an intracranial mass. No chronic intracranial blood products. No extra-axial fluid collection. No midline shift. Vascular: Expected proximal arterial flow voids. Skull and upper cervical spine: No focal marrow lesion. Sinuses/Orbits: Visualized orbits show no acute finding. Mild mucosal thickening within a posterior right ethmoid air cell. Mild mucosal thickening within the right maxillary sinus. Other: Left mastoid effusion. MRA HEAD FINDINGS Anterior circulation: Tortuosity of the visualized distal cervical internal carotid arteries. The intracranial internal carotid arteries are patent. Atherosclerotic irregularity of both vessels without significant stenosis. The M1 middle cerebral arteries are patent. No M2 proximal branch occlusion or high-grade proximal stenosis is identified. The  anterior cerebral arteries are patent. Mild to moderate stenosis within the distal A2 segment of the right anterior cerebral artery. 1-2 mm inferiorly projecting vascular protrusion arising from the supraclinoid left ICA, which may reflect an infundibulum or small aneurysm. Posterior circulation: The intracranial vertebral arteries are patent. The basilar artery is patent. The posterior cerebral arteries are patent. Severe stenosis within the left posterior cerebral artery at the P1/P2 junction. Anatomic variants: Posterior communicating arteries are hypoplastic or absent bilaterally. IMPRESSION: MRI brain: 1. 12 mm acute infarct within the anteromedial right frontal lobe subcortical white matter/callosal genu (right ACA vascular territory). 2. Background mild generalized cerebral atrophy and cerebral white matter chronic small  vessel disease. 3. Tiny chronic lacunar infarct within the left thalamus. 4. Mild paranasal sinus disease, as described. 5. Left mastoid effusion MRA head: 1. No intracranial large vessel occlusion. 2. Intracranial atherosclerotic disease multifocal stenoses, most notably as follows. 3. Mild-to-moderate stenosis within the distal A2 segment of the right anterior cerebral artery. 4. Severe stenosis within the left posterior cerebral artery at the P1/P2 junction. 5. 1-2 mm inferiorly projecting vascular protrusion arising from the supraclinoid left ICA, which may reflect an infundibulum or small aneurysm. Electronically Signed   By: Jackey Loge DO   On: 08/30/2020 14:11   MR ANGIO NECK W WO CONTRAST  Result Date: 08/31/2020 CLINICAL DATA:  Follow-up examination for acute stroke. EXAM: MRA NECK WITHOUT AND WITH CONTRAST TECHNIQUE: Multiplanar and multiecho pulse sequences of the neck were obtained without and with intravenous contrast. Angiographic images of the neck were obtained using MRA technique without and with intravenous contrast. CONTRAST:  68mL GADAVIST GADOBUTROL 1 MMOL/ML IV  SOLN COMPARISON:  Prior MRI from 08/30/2020. FINDINGS: AORTIC ARCH: Visualized aortic arch normal in caliber with normal 3 vessel morphology. No hemodynamically significant stenosis about the origin of the great vessels. RIGHT CAROTID SYSTEM: Right CCA widely patent from its origin to the bifurcation without stenosis. No significant atheromatous narrowing or irregularity about the right carotid bulb. Right ICA tortuous but widely patent to the skull base without stenosis, evidence for dissection or occlusion. LEFT CAROTID SYSTEM: Left CCA widely patent from its origin to the bifurcation without stenosis. Minor atheromatous irregularity about the left carotid bulb without significant stenosis. Left ICA tortuous but widely patent distally without stenosis, evidence for dissection or occlusion. VERTEBRAL ARTERIES: Both vertebral arteries arise from the subclavian arteries. Right vertebral artery slightly dominant. No proximal subclavian artery stenosis. Vertebral arteries widely patent without stenosis, evidence for dissection or occlusion. IMPRESSION: Negative MRA of the neck. No hemodynamically significant stenosis, dissection, or other acute vascular abnormality. Electronically Signed   By: Rise Mu M.D.   On: 08/31/2020 07:02   MR Brain Wo Contrast (neuro protocol)  Result Date: 08/30/2020 CLINICAL DATA:  Neuro deficit, acute, stroke suspected. Additional provided: Patient reports sudden onset slurred speech, double vision and blurry vision yesterday morning. EXAM: MRI HEAD WITHOUT CONTRAST MRA HEAD WITHOUT CONTRAST TECHNIQUE: Multiplanar, multi-echo pulse sequences of the brain and surrounding structures were acquired without intravenous contrast. Angiographic images of the Circle of Willis were acquired using MRA technique without intravenous contrast. COMPARISON:  Head CT head CT 08/30/2020. FINDINGS: MRI HEAD FINDINGS Brain: Mild generalized cerebral atrophy. 12 mm acute infarct within the  anteromedial right frontal lobe subcortical white matter/callosal genu. Background mild multifocal T2/FLAIR hyperintensity within the cerebral white matter, nonspecific but compatible with chronic small vessel ischemic disease. Tiny chronic lacunar infarct within the left thalamus. Incidentally noted cavum septum pellucidum and cavum vergae. No evidence of an intracranial mass. No chronic intracranial blood products. No extra-axial fluid collection. No midline shift. Vascular: Expected proximal arterial flow voids. Skull and upper cervical spine: No focal marrow lesion. Sinuses/Orbits: Visualized orbits show no acute finding. Mild mucosal thickening within a posterior right ethmoid air cell. Mild mucosal thickening within the right maxillary sinus. Other: Left mastoid effusion. MRA HEAD FINDINGS Anterior circulation: Tortuosity of the visualized distal cervical internal carotid arteries. The intracranial internal carotid arteries are patent. Atherosclerotic irregularity of both vessels without significant stenosis. The M1 middle cerebral arteries are patent. No M2 proximal branch occlusion or high-grade proximal stenosis is identified. The anterior cerebral arteries are  patent. Mild to moderate stenosis within the distal A2 segment of the right anterior cerebral artery. 1-2 mm inferiorly projecting vascular protrusion arising from the supraclinoid left ICA, which may reflect an infundibulum or small aneurysm. Posterior circulation: The intracranial vertebral arteries are patent. The basilar artery is patent. The posterior cerebral arteries are patent. Severe stenosis within the left posterior cerebral artery at the P1/P2 junction. Anatomic variants: Posterior communicating arteries are hypoplastic or absent bilaterally. IMPRESSION: MRI brain: 1. 12 mm acute infarct within the anteromedial right frontal lobe subcortical white matter/callosal genu (right ACA vascular territory). 2. Background mild generalized cerebral  atrophy and cerebral white matter chronic small vessel disease. 3. Tiny chronic lacunar infarct within the left thalamus. 4. Mild paranasal sinus disease, as described. 5. Left mastoid effusion MRA head: 1. No intracranial large vessel occlusion. 2. Intracranial atherosclerotic disease multifocal stenoses, most notably as follows. 3. Mild-to-moderate stenosis within the distal A2 segment of the right anterior cerebral artery. 4. Severe stenosis within the left posterior cerebral artery at the P1/P2 junction. 5. 1-2 mm inferiorly projecting vascular protrusion arising from the supraclinoid left ICA, which may reflect an infundibulum or small aneurysm. Electronically Signed   By: Jackey Loge DO   On: 08/30/2020 14:11       Damiano Stamper T. [facility]  If 7PM-7AM, please contact night-coverage [email or web address] 08/31/2020, 12:13 PM

## 2020-08-31 NOTE — Consult Note (Signed)
Referring Physician: Dr. Alanda SlimGonfa    Chief Complaint: Slurred speech, double vision, blurred vision and right facial weakness with paresthesias.   HPI: Tony Combs is an 60 y.o. male with a PMHx of DM and HTN who presented to the [facility] ED on Thursday morning for evaluation of acute onset slurred speech with diplopia and visual blurring. Symptoms started on Wednesday at 0900. [facility] first went to [facility] on Wednesday where CT was done. The patient stated that he "never saw a doctor" so he left AMA. He then presented to the [facility] ED the next day (Thursday) with complaints of persistently slurred speech in conjunction with headache and blurred/double vision. He also stated that the right side of his face felt weak and tingly. Intermittent dizziness at work was also endorsed. Some difficulty swallowing was also noted by the patient. He denied any limb weakness. He had had similar symptoms intermittently over the past 2 months. CBG at [facility] was 348.   MRI brain revealed a 12 mm acute ischemic infarction within the anteromedial right frontal lobe, as well as a tiny chronic left thalamic infarction. MRA head revealed no LVO.   Home meds include daily ASA.   LSN: Wednesday at 0900 tPA Given: No: Out of the time window  Past Medical History:  Diagnosis Date   Diabetes mellitus without complication (HCC)    Hypertension     Past Surgical History:  Procedure Laterality Date   HERNIA REPAIR      No family history on file. Social History:  reports that he has never smoked. He has never used smokeless tobacco. He reports previous alcohol use. He reports previous drug use.  Allergies: No Known Allergies  Medications: Prior to Admission:  Medications Prior to Admission  Medication Sig Dispense Refill Last Dose   amLODipine (NORVASC) 10 MG tablet Take 10 mg by mouth daily.   08/30/2020   amoxicillin (AMOXIL) 500 MG capsule Take 500 mg by mouth 3 (three) times daily.   08/30/2020    atorvastatin (LIPITOR) 20 MG tablet Take 20 mg by mouth daily.   08/30/2020   glipiZIDE (GLUCOTROL) 10 MG tablet Take 10 mg by mouth in the morning and at bedtime.   08/30/2020   hydrochlorothiazide (HYDRODIURIL) 25 MG tablet Take 25 mg by mouth daily.   08/30/2020   metFORMIN (GLUCOPHAGE) 500 MG tablet Take 500 mg by mouth 2 (two) times daily.   08/30/2020   RYBELSUS 7 MG TABS Take 1 tablet by mouth daily.   08/30/2020   ibuprofen (ADVIL) 600 MG tablet Take 1 tablet by mouth daily as needed.      Scheduled:  aspirin EC  81 mg Oral Daily   Or   aspirin  150 mg Rectal Daily   enoxaparin (LOVENOX) injection  40 mg Subcutaneous Q24H   insulin aspart  0-15 Units Subcutaneous TID WC   insulin aspart  0-5 Units Subcutaneous QHS   insulin aspart  3 Units Subcutaneous TID WC   insulin glargine  10 Units Subcutaneous BID    ROS: As per HPI.   Physical Examination: Blood pressure (!) 145/90, pulse (!) 106, temperature 97.9 F (36.6 C), temperature source Oral, resp. rate 18, height 5\' 8"  (1.727 m), weight 101.6 kg, SpO2 94 %.  HEENT: [place]/AT Lungs: Respirations unlabored Ext: No edema  Neurologic Examination: Mental Status: Alert, oriented, thought content appropriate.  Speech fluent without evidence of aphasia.  Able to follow all commands without difficulty. Subtle dysarthria.  Cranial Nerves:  II:  Visual fields intact. PERRL. No mydriasis or miosis.  III,IV, VI: No ptosis. EOMI. No nystagmus.  V: Temp sensation intact bilaterally  VII: No definite asymmetry.  VIII: Hearing intact to voice IX,X: No hypophonia XI: Symmetric XII: Midline tongue extension  Motor: RUE and RLE 5/5 LUE 5/5 LLE 5/5 except for 4/5 ADF and APF Sensory: Temp and light touch intact throughout, bilaterally. No extinction to DSS.  Deep Tendon Reflexes:  Normoactive x 4.  Cerebellar: No ataxia with FNF bilaterally  Gait: Deferred   Results for orders placed or performed during the hospital encounter of 08/30/20  (from the past 48 hour(s))  Ethanol     Status: None   Collection Time: 08/30/20 10:37 AM  Result Value Ref Range   Alcohol, Ethyl (B) <10 <10 mg/dL    Comment: (NOTE) Lowest detectable limit for serum alcohol is 10 mg/dL.  For medical purposes only. Performed at [facility], [address], [place], [place] [address]   Protime-INR     Status: None   Collection Time: 08/30/20 10:37 AM  Result Value Ref Range   Prothrombin Time 12.4 11.4 - 15.2 seconds   INR 0.9 0.8 - 1.2    Comment: (NOTE) INR goal varies based on device and disease states. Performed at [facility], [address] [place], [place] [address]   APTT     Status: None   Collection Time: 08/30/20 10:37 AM  Result Value Ref Range   aPTT 27 24 - 36 seconds    Comment: Performed at [facility], [address] [place], [place] [address]  CBC     Status: None   Collection Time: 08/30/20 10:37 AM  Result Value Ref Range   WBC 6.5 4.0 - 10.5 K/uL   RBC 5.25 4.22 - 5.81 MIL/uL   Hemoglobin 15.1 13.0 - 17.0 g/dL   HCT 91.4 78.2 - 95.6 %   MCV 85.9 80.0 - 100.0 fL   MCH 28.8 26.0 - 34.0 pg   MCHC 33.5 30.0 - 36.0 g/dL   RDW 21.3 08.6 - 57.8 %   Platelets 208 150 - 400 K/uL   nRBC 0.0 0.0 - 0.2 %    Comment: Performed at [facility], [address] [place], [place] [address]  Differential     Status: None   Collection Time: 08/30/20 10:37 AM  Result Value Ref Range   Neutrophils Relative % 70 %   Neutro Abs 4.6 1.7 - 7.7 K/uL   Lymphocytes Relative 20 %   Lymphs Abs 1.3 0.7 - 4.0 K/uL   Monocytes Relative 7 %   Monocytes Absolute 0.4 0.1 - 1.0 K/uL   Eosinophils Relative 2 %   Eosinophils Absolute 0.1 0.0 - 0.5 K/uL   Basophils Relative 1 %   Basophils Absolute 0.0 0.0 - 0.1 K/uL   Immature Granulocytes 0 %   Abs Immature Granulocytes 0.01 0.00 - 0.07 K/uL    Comment: Performed at [facility], [address] [place], [place] [address]  Comprehensive metabolic panel     Status: Abnormal   Collection Time:  08/30/20 10:37 AM  Result Value Ref Range   Sodium 132 (L) 135 - 145 mmol/L   Potassium 3.6 3.5 - 5.1 mmol/L   Chloride 94 (L) 98 - 111 mmol/L   CO2 29 22 - 32 mmol/L   Glucose, Bld 341 (H) 70 - 99 mg/dL    Comment: Glucose reference range applies only to samples taken after fasting for at least  8 hours.   BUN 11 8 - 23 mg/dL   Creatinine, Ser 7.40 0.61 - 1.24 mg/dL   Calcium 8.9 8.9 - 81.4 mg/dL   Total Protein 7.6 6.5 - 8.1 g/dL   Albumin 4.0 3.5 - 5.0 g/dL   AST 37 15 - 41 U/L   ALT 44 0 - 44 U/L   Alkaline Phosphatase 83 38 - 126 U/L   Total Bilirubin 1.1 0.3 - 1.2 mg/dL   GFR, Estimated >48 >18 mL/min    Comment: (NOTE) Calculated using the CKD-EPI Creatinine Equation (2021)    Anion gap 9 5 - 15    Comment: Performed at [facility], [address] [place], [place] [address]  CBG monitoring, ED     Status: Abnormal   Collection Time: 08/30/20 10:47 AM  Result Value Ref Range   Glucose-Capillary 350 (H) 70 - 99 mg/dL    Comment: Glucose reference range applies only to samples taken after fasting for at least 8 hours.   Comment 1 Notify RN   I-stat chem 8, ED     Status: Abnormal   Collection Time: 08/30/20 10:49 AM  Result Value Ref Range   Sodium 135 135 - 145 mmol/L   Potassium 3.7 3.5 - 5.1 mmol/L   Chloride 94 (L) 98 - 111 mmol/L   BUN 10 8 - 23 mg/dL   Creatinine, Ser 9.70 (L) 0.61 - 1.24 mg/dL   Glucose, Bld 263 (H) 70 - 99 mg/dL    Comment: Glucose reference range applies only to samples taken after fasting for at least 8 hours.   Calcium, Ion 0.98 (L) 1.15 - 1.40 mmol/L   TCO2 29 22 - 32 mmol/L   Hemoglobin 16.0 13.0 - 17.0 g/dL   HCT 78.5 88.5 - 02.7 %  Urine rapid drug screen (hosp performed)     Status: None   Collection Time: 08/30/20 11:09 AM  Result Value Ref Range   Opiates NONE DETECTED NONE DETECTED   Cocaine NONE DETECTED NONE DETECTED   Benzodiazepines NONE DETECTED NONE DETECTED   Amphetamines NONE DETECTED NONE DETECTED    Tetrahydrocannabinol NONE DETECTED NONE DETECTED   Barbiturates NONE DETECTED NONE DETECTED    Comment: (NOTE) DRUG SCREEN FOR MEDICAL PURPOSES ONLY.  IF CONFIRMATION IS NEEDED FOR ANY PURPOSE, NOTIFY LAB WITHIN 5 DAYS.  LOWEST DETECTABLE LIMITS FOR URINE DRUG SCREEN Drug Class                     Cutoff (ng/mL) Amphetamine and metabolites    1000 Barbiturate and metabolites    200 Benzodiazepine                 200 Tricyclics and metabolites     300 Opiates and metabolites        300 Cocaine and metabolites        300 THC                            50 Performed at [facility], [address] [place], [place] [address]   Urinalysis, Routine w reflex microscopic Urine, Clean Catch     Status: Abnormal   Collection Time: 08/30/20 11:09 AM  Result Value Ref Range   Color, Urine YELLOW YELLOW   APPearance CLEAR CLEAR   Specific Gravity, Urine 1.023 1.005 - 1.030   pH 6.0 5.0 - 8.0   Glucose, UA >=500 (A) NEGATIVE mg/dL   Hgb urine  dipstick NEGATIVE NEGATIVE   Bilirubin Urine NEGATIVE NEGATIVE   Ketones, ur 5 (A) NEGATIVE mg/dL   Protein, ur NEGATIVE NEGATIVE mg/dL   Nitrite NEGATIVE NEGATIVE   Leukocytes,Ua NEGATIVE NEGATIVE   Bacteria, UA NONE SEEN NONE SEEN   Squamous Epithelial / LPF 0-5 0 - 5    Comment: Performed at [facility], [address], [place], [place] [address]  Resp Panel by RT-PCR (Flu A&B, Covid) Nasopharyngeal Swab     Status: None   Collection Time: 08/30/20 11:11 AM   Specimen: Nasopharyngeal Swab; Nasopharyngeal(NP) swabs in vial transport medium  Result Value Ref Range   SARS Coronavirus 2 by RT PCR NEGATIVE NEGATIVE    Comment: (NOTE) SARS-CoV-2 target nucleic acids are NOT DETECTED.  The SARS-CoV-2 RNA is generally detectable in upper respiratory specimens during the acute phase of infection. The lowest concentration of SARS-CoV-2 viral copies this assay can detect is 138 copies/mL. A negative result does not preclude SARS-Cov-2 infection and  should not be used as the sole basis for treatment or other patient management decisions. A negative result may occur with  improper specimen collection/handling, submission of specimen other than nasopharyngeal swab, presence of viral mutation(s) within the areas targeted by this assay, and inadequate number of viral copies(<138 copies/mL). A negative result must be combined with clinical observations, patient history, and epidemiological information. The expected result is Negative.  Fact Sheet for Patients:  BloggerCourse.com  Fact Sheet for Healthcare Providers:  SeriousBroker.it  This test is no t yet approved or cleared by the [place] FDA and  has been authorized for detection and/or diagnosis of SARS-CoV-2 by FDA under an Emergency Use Authorization (EUA). This EUA will remain  in effect (meaning this test can be used) for the duration of the COVID-19 declaration under Section 564(b)(1) of the Act, 21 U.S.C.section 360bbb-3(b)(1), unless the authorization is terminated  or revoked sooner.       Influenza A by PCR NEGATIVE NEGATIVE   Influenza B by PCR NEGATIVE NEGATIVE    Comment: (NOTE) The Xpert Xpress SARS-CoV-2/FLU/RSV plus assay is intended as an aid in the diagnosis of influenza from Nasopharyngeal swab specimens and should not be used as a sole basis for treatment. Nasal washings and aspirates are unacceptable for Xpert Xpress SARS-CoV-2/FLU/RSV testing.  Fact Sheet for Patients: BloggerCourse.com  Fact Sheet for Healthcare Providers: SeriousBroker.it  This test is not yet approved or cleared by the [place] FDA and has been authorized for detection and/or diagnosis of SARS-CoV-2 by FDA under an Emergency Use Authorization (EUA). This EUA will remain in effect (meaning this test can be used) for the duration of the COVID-19 declaration under Section  564(b)(1) of the Act, 21 U.S.C. section 360bbb-3(b)(1), unless the authorization is terminated or revoked.  Performed at [facility], [address] [place], [place] [address]   Glucose, capillary     Status: Abnormal   Collection Time: 08/31/20 12:20 AM  Result Value Ref Range   Glucose-Capillary 231 (H) 70 - 99 mg/dL    Comment: Glucose reference range applies only to samples taken after fasting for at least 8 hours.  Glucose, capillary     Status: Abnormal   Collection Time: 08/31/20  1:09 AM  Result Value Ref Range   Glucose-Capillary 250 (H) 70 - 99 mg/dL    Comment: Glucose reference range applies only to samples taken after fasting for at least 8 hours.  Hemoglobin A1c     Status: Abnormal   Collection Time: 08/31/20  3:21 AM  Result Value Ref Range   Hgb A1c MFr Bld 11.7 (H) 4.8 - 5.6 %    Comment: (NOTE) Pre diabetes:          5.7%-6.4%  Diabetes:              >6.4%  Glycemic control for   <7.0% adults with diabetes    Mean Plasma Glucose 289.09 mg/dL    Comment: Performed at [facility], [address], [place], [place] [address]  Lipid panel     Status: Abnormal   Collection Time: 08/31/20  3:21 AM  Result Value Ref Range   Cholesterol 143 0 - 200 mg/dL   Triglycerides 604 (H) <150 mg/dL   HDL 26 (L) >54 mg/dL   Total CHOL/HDL Ratio 5.5 RATIO   VLDL 56 (H) 0 - 40 mg/dL   LDL Cholesterol 61 0 - 99 mg/dL    Comment:        Total Cholesterol/HDL:CHD Risk Coronary Heart Disease Risk Table                     Men   Women  1/2 Average Risk   3.4   3.3  Average Risk       5.0   4.4  2 X Average Risk   9.6   7.1  3 X Average Risk  23.4   11.0        Use the calculated Patient Ratio above and the CHD Risk Table to determine the patient's CHD Risk.        ATP III CLASSIFICATION (LDL):  <100     mg/dL   Optimal  098-119  mg/dL   Near or Above                    Optimal  130-159  mg/dL   Borderline  147-829  mg/dL   High  >562     mg/dL   Very  High Performed at [facility], [address] [place], [place] [address]   Glucose, capillary     Status: Abnormal   Collection Time: 08/31/20  7:09 AM  Result Value Ref Range   Glucose-Capillary 237 (H) 70 - 99 mg/dL    Comment: Glucose reference range applies only to samples taken after fasting for at least 8 hours.   CT HEAD WO CONTRAST  Result Date: 08/30/2020 CLINICAL DATA:  Slurred speech for 1 month EXAM: CT HEAD WITHOUT CONTRAST TECHNIQUE: Contiguous axial images were obtained from the base of the skull through the vertex without intravenous contrast. COMPARISON:  08/29/2020 FINDINGS: Brain: No evidence of acute infarction, hemorrhage, hydrocephalus, extra-axial collection or mass lesion/mass effect. Cavum septum pellucidum incidentally noted. Minimal low-density changes within the periventricular and subcortical white matter compatible with chronic microvascular ischemic change. Vascular: Atherosclerotic calcifications involving the large vessels of the skull base. No unexpected hyperdense vessel. Skull: Normal. Negative for fracture or focal lesion. Sinuses/Orbits: No acute finding. Other: None. IMPRESSION: 1. No acute intracranial findings. 2. Mild chronic microvascular ischemic change. Electronically Signed   By: Duanne Guess D.O.   On: 08/30/2020 12:01   CT Chest Wo Contrast  Result Date: 08/30/2020 CLINICAL DATA:  Basilar opacities on recent chest x-ray. EXAM: CT CHEST WITHOUT CONTRAST TECHNIQUE: Multidetector CT imaging of the chest was performed following the standard protocol without IV contrast. COMPARISON:  Chest x-ray earlier today FINDINGS: Cardiovascular: The heart size is normal. No substantial pericardial effusion. Mild  atherosclerotic calcification is noted in the wall of the thoracic aorta. Mediastinum/Nodes: No mediastinal lymphadenopathy. No evidence for gross hilar lymphadenopathy although assessment is limited by the lack of intravenous contrast on today's  study. The esophagus has normal imaging features. There is no axillary lymphadenopathy. Lungs/Pleura: 4 mm right middle lobe nodule identified on image 90/4. Calcified granuloma noted left lower lobe. No suspicious pulmonary nodule or mass. No focal airspace consolidation. There is no evidence of pleural effusion. Upper Abdomen: The liver shows diffusely decreased attenuation suggesting fat deposition. Otherwise unremarkable visualized upper abdomen. Musculoskeletal: No worrisome lytic or sclerotic osseous abnormality. IMPRESSION: 1. No acute findings in the chest. Specifically, no evidence for pulmonary edema, pleural effusion, or focal airspace consolidation. 2. 4 mm right middle lobe pulmonary nodule. No follow-up needed if patient is low-risk. Non-contrast chest CT can be considered in 12 months if patient is high-risk. This recommendation follows the consensus statement: Guidelines for Management of Incidental Pulmonary Nodules Detected on CT Images: From the Fleischner Society 2017; Radiology 2017; 284:228-243. 3. Hepatic steatosis. 4. Aortic Atherosclerosis (ICD10-I70.0). Electronically Signed   By: Kennith Center M.D.   On: 08/30/2020 13:58   MR ANGIO HEAD WO CONTRAST  Result Date: 08/30/2020 CLINICAL DATA:  Neuro deficit, acute, stroke suspected. Additional provided: Patient reports sudden onset slurred speech, double vision and blurry vision yesterday morning. EXAM: MRI HEAD WITHOUT CONTRAST MRA HEAD WITHOUT CONTRAST TECHNIQUE: Multiplanar, multi-echo pulse sequences of the brain and surrounding structures were acquired without intravenous contrast. Angiographic images of the Circle of Willis were acquired using MRA technique without intravenous contrast. COMPARISON:  Head CT head CT 08/30/2020. FINDINGS: MRI HEAD FINDINGS Brain: Mild generalized cerebral atrophy. 12 mm acute infarct within the anteromedial right frontal lobe subcortical white matter/callosal genu. Background mild multifocal T2/FLAIR  hyperintensity within the cerebral white matter, nonspecific but compatible with chronic small vessel ischemic disease. Tiny chronic lacunar infarct within the left thalamus. Incidentally noted cavum septum pellucidum and cavum vergae. No evidence of an intracranial mass. No chronic intracranial blood products. No extra-axial fluid collection. No midline shift. Vascular: Expected proximal arterial flow voids. Skull and upper cervical spine: No focal marrow lesion. Sinuses/Orbits: Visualized orbits show no acute finding. Mild mucosal thickening within a posterior right ethmoid air cell. Mild mucosal thickening within the right maxillary sinus. Other: Left mastoid effusion. MRA HEAD FINDINGS Anterior circulation: Tortuosity of the visualized distal cervical internal carotid arteries. The intracranial internal carotid arteries are patent. Atherosclerotic irregularity of both vessels without significant stenosis. The M1 middle cerebral arteries are patent. No M2 proximal branch occlusion or high-grade proximal stenosis is identified. The anterior cerebral arteries are patent. Mild to moderate stenosis within the distal A2 segment of the right anterior cerebral artery. 1-2 mm inferiorly projecting vascular protrusion arising from the supraclinoid left ICA, which may reflect an infundibulum or small aneurysm. Posterior circulation: The intracranial vertebral arteries are patent. The basilar artery is patent. The posterior cerebral arteries are patent. Severe stenosis within the left posterior cerebral artery at the P1/P2 junction. Anatomic variants: Posterior communicating arteries are hypoplastic or absent bilaterally. IMPRESSION: MRI brain: 1. 12 mm acute infarct within the anteromedial right frontal lobe subcortical white matter/callosal genu (right ACA vascular territory). 2. Background mild generalized cerebral atrophy and cerebral white matter chronic small vessel disease. 3. Tiny chronic lacunar infarct within the  left thalamus. 4. Mild paranasal sinus disease, as described. 5. Left mastoid effusion MRA head: 1. No intracranial large vessel occlusion. 2. Intracranial atherosclerotic disease multifocal stenoses,  most notably as follows. 3. Mild-to-moderate stenosis within the distal A2 segment of the right anterior cerebral artery. 4. Severe stenosis within the left posterior cerebral artery at the P1/P2 junction. 5. 1-2 mm inferiorly projecting vascular protrusion arising from the supraclinoid left ICA, which may reflect an infundibulum or small aneurysm. Electronically Signed   By: Jackey Loge DO   On: 08/30/2020 14:11   MR ANGIO NECK W WO CONTRAST  Result Date: 08/31/2020 CLINICAL DATA:  Follow-up examination for acute stroke. EXAM: MRA NECK WITHOUT AND WITH CONTRAST TECHNIQUE: Multiplanar and multiecho pulse sequences of the neck were obtained without and with intravenous contrast. Angiographic images of the neck were obtained using MRA technique without and with intravenous contrast. CONTRAST:  56mL GADAVIST GADOBUTROL 1 MMOL/ML IV SOLN COMPARISON:  Prior MRI from 08/30/2020. FINDINGS: AORTIC ARCH: Visualized aortic arch normal in caliber with normal 3 vessel morphology. No hemodynamically significant stenosis about the origin of the great vessels. RIGHT CAROTID SYSTEM: Right CCA widely patent from its origin to the bifurcation without stenosis. No significant atheromatous narrowing or irregularity about the right carotid bulb. Right ICA tortuous but widely patent to the skull base without stenosis, evidence for dissection or occlusion. LEFT CAROTID SYSTEM: Left CCA widely patent from its origin to the bifurcation without stenosis. Minor atheromatous irregularity about the left carotid bulb without significant stenosis. Left ICA tortuous but widely patent distally without stenosis, evidence for dissection or occlusion. VERTEBRAL ARTERIES: Both vertebral arteries arise from the subclavian arteries. Right vertebral  artery slightly dominant. No proximal subclavian artery stenosis. Vertebral arteries widely patent without stenosis, evidence for dissection or occlusion. IMPRESSION: Negative MRA of the neck. No hemodynamically significant stenosis, dissection, or other acute vascular abnormality. Electronically Signed   By: Rise Mu M.D.   On: 08/31/2020 07:02   MR Brain Wo Contrast (neuro protocol)  Result Date: 08/30/2020 CLINICAL DATA:  Neuro deficit, acute, stroke suspected. Additional provided: Patient reports sudden onset slurred speech, double vision and blurry vision yesterday morning. EXAM: MRI HEAD WITHOUT CONTRAST MRA HEAD WITHOUT CONTRAST TECHNIQUE: Multiplanar, multi-echo pulse sequences of the brain and surrounding structures were acquired without intravenous contrast. Angiographic images of the Circle of Willis were acquired using MRA technique without intravenous contrast. COMPARISON:  Head CT head CT 08/30/2020. FINDINGS: MRI HEAD FINDINGS Brain: Mild generalized cerebral atrophy. 12 mm acute infarct within the anteromedial right frontal lobe subcortical white matter/callosal genu. Background mild multifocal T2/FLAIR hyperintensity within the cerebral white matter, nonspecific but compatible with chronic small vessel ischemic disease. Tiny chronic lacunar infarct within the left thalamus. Incidentally noted cavum septum pellucidum and cavum vergae. No evidence of an intracranial mass. No chronic intracranial blood products. No extra-axial fluid collection. No midline shift. Vascular: Expected proximal arterial flow voids. Skull and upper cervical spine: No focal marrow lesion. Sinuses/Orbits: Visualized orbits show no acute finding. Mild mucosal thickening within a posterior right ethmoid air cell. Mild mucosal thickening within the right maxillary sinus. Other: Left mastoid effusion. MRA HEAD FINDINGS Anterior circulation: Tortuosity of the visualized distal cervical internal carotid arteries. The  intracranial internal carotid arteries are patent. Atherosclerotic irregularity of both vessels without significant stenosis. The M1 middle cerebral arteries are patent. No M2 proximal branch occlusion or high-grade proximal stenosis is identified. The anterior cerebral arteries are patent. Mild to moderate stenosis within the distal A2 segment of the right anterior cerebral artery. 1-2 mm inferiorly projecting vascular protrusion arising from the supraclinoid left ICA, which may reflect an infundibulum or small aneurysm.  Posterior circulation: The intracranial vertebral arteries are patent. The basilar artery is patent. The posterior cerebral arteries are patent. Severe stenosis within the left posterior cerebral artery at the P1/P2 junction. Anatomic variants: Posterior communicating arteries are hypoplastic or absent bilaterally. IMPRESSION: MRI brain: 1. 12 mm acute infarct within the anteromedial right frontal lobe subcortical white matter/callosal genu (right ACA vascular territory). 2. Background mild generalized cerebral atrophy and cerebral white matter chronic small vessel disease. 3. Tiny chronic lacunar infarct within the left thalamus. 4. Mild paranasal sinus disease, as described. 5. Left mastoid effusion MRA head: 1. No intracranial large vessel occlusion. 2. Intracranial atherosclerotic disease multifocal stenoses, most notably as follows. 3. Mild-to-moderate stenosis within the distal A2 segment of the right anterior cerebral artery. 4. Severe stenosis within the left posterior cerebral artery at the P1/P2 junction. 5. 1-2 mm inferiorly projecting vascular protrusion arising from the supraclinoid left ICA, which may reflect an infundibulum or small aneurysm. Electronically Signed   By: Jackey Loge DO   On: 08/30/2020 14:11   DG Chest Port 1 View  Result Date: 08/30/2020 CLINICAL DATA:  Dizziness. EXAM: PORTABLE CHEST 1 VIEW COMPARISON:  None. FINDINGS: The heart size and mediastinal contours  are within normal limits. Hazy left basilar opacities. No visible pleural effusions or pneumothorax on this single semi-upright radiograph. IMPRESSION: Hazy left basilar opacities, which may represent prominent diaphragmatic/mediastinal fat and/or atelectasis. Infection is difficult to exclude in this area and dedicated PA and lateral radiographs could further characterize if clinically indicated. Electronically Signed   By: Feliberto Harts MD   On: 08/30/2020 11:52    Assessment: 60 y.o. male presenting with slurred speech, double vision, blurred vision and right facial weakness with paresthesias.  1. Exam reveals weakness of ADF and APF on the left. No sensory abnormalities noted.  2. MRI brain: 12 mm acute infarct within the anteromedial right frontal lobe subcortical white matter/callosal genu (right ACA vascular territory). Background mild generalized cerebral atrophy and cerebral white matter chronic small vessel disease. Tiny chronic lacunar infarct within the left thalamus.   3. MRA head: No intracranial large vessel occlusion. Intracranial atherosclerotic disease multifocal stenoses, most notably as follows. Mild-to-moderate stenosis within the distal A2 segment of the right anterior cerebral artery. Severe stenosis within the left posterior cerebral artery at the P1/P2 junction. 1-2 mm inferiorly projecting vascular protrusion arising from the supraclinoid left ICA, which may reflect an infundibulum or small aneurysm. 4. Stroke Risk Factors - DM, HLD and HTN  Recommendations: 1. Carotid ultrasound 2. TTE 3. PT consult, OT consult, Speech consult 4. Continue ASA 5. Start atorvastatin at 40 mg po qd 7. Risk factor modification 8. Telemetry monitoring 9. Frequent neuro checks 10. BP management. Out of the permissive HTN time window.  11. Glycemic control    signed: Dr. Caryl Pina  08/31/2020, 9:17 AM

## 2020-08-31 NOTE — Plan of Care (Signed)
  RD consulted for nutrition education regarding diabetes.   Called pt on room phone to discuss carb consistent diet principles. Pt reports that he is not newly dx with DM, unsure when he was dx but states it has been several years. Has never received outpatient DM education in the past, noted that DM educator has sent a referral already.  Went through a typical day of meals with pt and discussed foods he was eating that contain carbs and how much was being consumed at one time. Gave ideas for modifications that could be utilized to lower the amount of carbs at one sitting. Overall, diet sounds high in fast food and sugary beverages.   Lab Results  Component Value Date   HGBA1C 11.7 (H) 08/31/2020     RD attached  "Carbohydrate Counting for People with Diabetes" handout from the Academy of [employer] Dietetics to discharge paperwork. Pt aware of where to find it in the AVS.    Discussed different food groups and their effects on blood sugar, emphasizing carbohydrate-containing foods. Provided list of carbohydrates and recommended serving sizes of common foods.  Discussed importance of controlled and consistent carbohydrate intake throughout the day. Provided examples of ways to balance meals/snacks and encouraged intake of high-fiber, whole grain complex carbohydrates. Teach back method used. Pt not extremely engaged during conversation.  Expect fair compliance.  Body mass index is 34.06 kg/m. Pt meets criteria for obesity class II based on current BMI.  Current diet order is DYS 3, carb modified, no meals recorded at this time. Labs and medications reviewed. No further nutrition interventions warranted at this time. RD contact information provided. If additional nutrition issues arise, please re-consult RD.  Greig Castilla, RD, LDN Clinical Dietitian Pager on Amion

## 2020-08-31 NOTE — Evaluation (Signed)
Speech Language Pathology Evaluation Patient Details Name: Tony Combs MRN: [id number] DOB: Nov 11, 1960 Today's Date: 08/31/2020 Time: 7341-9379 SLP Time Calculation (min) (ACUTE ONLY): 13 min  Problem List:  Patient Active Problem List   Diagnosis Date Noted   Acute CVA (cerebrovascular accident) (HCC) 08/30/2020   DM (diabetes mellitus) (HCC) 08/30/2020   HTN (hypertension) 08/30/2020   Pulmonary nodule 08/30/2020   Past Medical History:  Past Medical History:  Diagnosis Date   Diabetes mellitus without complication (HCC)    Hypertension    Past Surgical History:  Past Surgical History:  Procedure Laterality Date   HERNIA REPAIR     HPI:  60 y/o male presented to ED on 7/14 with complaints of double vision, blurry vision, and slurred speech which has been flucuating since May. CT head negative. 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 PMH: DM type 2, HTN   Assessment / Plan / Recommendation Clinical Impression  Pt presents with dysarthria and possible motor planning deficits impacting speech as well. He is hypernasal with imprecise articulation, but at times seems to also have phonemic distortions vs substitutions. His intelligibility is mildly impacted as he also tries to compensate on his own. Additional education was provided about strategies he can try. Other testing of general cognitive skills seemed to be [facility] and both he and his ex-wife believe that his mentation seems to be at baseline. Recommend additional SLP f/u acutely and at OP to address dysarthria. Can also provide ongoing assessment of higher level cognitive skills if indicated.    SLP Assessment  SLP Recommendation/Assessment: Patient needs continued Speech Lanaguage Pathology Services SLP Visit Diagnosis: Dysphagia, unspecified (R13.10)    Follow Up Recommendations  Outpatient SLP    Frequency and Duration min 2x/week  2 weeks      SLP  Evaluation Cognition  Overall Cognitive Status: Within Functional Limits for tasks assessed       Comprehension  Auditory Comprehension Overall Auditory Comprehension: Appears within functional limits for tasks assessed    Expression Expression Primary Mode of Expression: Verbal Verbal Expression Overall Verbal Expression: Appears within functional limits for tasks assessed   Oral / Motor  Oral Motor/Sensory Function Overall Oral Motor/Sensory Function: Moderate impairment Facial ROM: Reduced right;Reduced left Facial Symmetry: Within Functional Limits Facial Strength: Reduced right;Reduced left Lingual ROM: Within Functional Limits Lingual Symmetry: Within Functional Limits Lingual Strength: Reduced Velum: Impaired right;Impaired left Motor Speech Overall Motor Speech: Impaired Respiration: Within functional limits Phonation: Normal Resonance: Hypernasality Articulation: Impaired Level of Impairment: Word Intelligibility: Intelligibility reduced Word: 75-100% accurate Phrase: 75-100% accurate Motor Speech Errors: Aware;Inconsistent Effective Techniques: Slow rate   GO                    Mahala Menghini., M.A. CCC-SLP Acute Rehabilitation Services Pager [phone] Office [phone]  08/31/2020, 12:55 PM

## 2020-08-31 NOTE — TOC Initial Note (Signed)
Transition of Care [facility]) - Initial/Assessment Note    Patient Details  Name: Tony Combs MRN: [id number] Date of Birth: 03-31-1960  [facility]) CM/SW Contact:    Lockie Pares, RN Phone Number: 08/31/2020, 9:05 AM  Clinical Narrative:                 60 year old patient admitted for stroke, having symptoms varying since July 5th. CT showed nothing acute however MRI revealed stroke.  PT evaluations will be done and will assess for recommendations, may need HH Has ex wife as support. CM will follow for needs  Expected Discharge Plan: Home w Home Health Services Barriers to Discharge: Continued Medical Work up   Patient Goals and CMS Choice        Expected Discharge Plan and Services Expected Discharge Plan: Home w Home Health Services   Discharge Planning Services: CM Consult                                          Prior Living Arrangements/Services     Patient language and need for interpreter reviewed:: Yes        Need for Family Participation in Patient Care: Yes (Comment) Care giver support system in place?: Yes (comment)   Criminal Activity/Legal Involvement Pertinent to Current Situation/Hospitalization: No - Comment as needed  Activities of Daily Living Home Assistive Devices/Equipment: None ADL Screening (condition at time of admission) Patient's cognitive ability adequate to safely complete daily activities?: Yes Is the patient deaf or have difficulty hearing?: No Does the patient have difficulty seeing, even when wearing glasses/contacts?: No Does the patient have difficulty concentrating, remembering, or making decisions?: No Patient able to express need for assistance with ADLs?: Yes Does the patient have difficulty dressing or bathing?: No Independently performs ADLs?: Yes (appropriate for developmental age) Does the patient have difficulty walking or climbing stairs?: Yes Weakness of Legs: None Weakness of Arms/Hands:  None  Permission Sought/Granted                  Emotional Assessment       Orientation: : Oriented to Self, Oriented to Place, Oriented to  Time, Oriented to Situation Alcohol / Substance Use: Not Applicable Psych Involvement: No (comment)  Admission diagnosis:  Hyperglycemia [R73.9] Acute CVA (cerebrovascular accident) (HCC) [I63.9] Cerebrovascular accident (CVA), unspecified mechanism (HCC) [I63.9] Patient Active Problem List   Diagnosis Date Noted   Acute CVA (cerebrovascular accident) (HCC) 08/30/2020   DM (diabetes mellitus) (HCC) 08/30/2020   HTN (hypertension) 08/30/2020   Pulmonary nodule 08/30/2020   PCP:  Juliette Alcide, MD Pharmacy:   [facility] [phone] - [place], [place] - [address] AT [facility] [address] [place] [address] Phone: [phone] Fax: [phone]     Social Determinants of Health (SDOH) Interventions    Readmission Risk Interventions No flowsheet data found.

## 2020-08-31 NOTE — Progress Notes (Signed)
Modified Barium Swallow Progress Note  Patient Details  Name: Tony Combs MRN: [id number] Date of Birth: April 18, 1960  Today's Date: 08/31/2020  Modified Barium Swallow completed.  Full report located under Chart Review in the Imaging Section.  Brief recommendations include the following:  Clinical Impression  Pt has a mild oral dysphagia with relatively intact pharyngeal phase of swallowing. He has incomplete velopharyngeal closure, allowing thin liquids to move anteriorly into the nasopharynx but consistently clearing back down into the pharynx. Pt also reports good sensation and awareness of when this occurs. He has piecemeal swallowing with purees and takes extra time to fully masticate solids. No aspiration occurs across consistencies and there is no significant oral or pharyngeal residue. Recommend continuing with Dys 3 diet and thin liquids. Pt can use a cup or a straw per his preference, but would take smaller volumes at a time to try to reduce nasal regurgitation.   Swallow Evaluation Recommendations       SLP Diet Recommendations: Dysphagia 3 (Mech soft) solids;Thin liquid   Liquid Administration via: Cup;Straw   Medication Administration: Whole meds with liquid   Supervision: Patient able to self feed;Intermittent supervision to cue for compensatory strategies   Compensations: Slow rate;Small sips/bites   Postural Changes: Seated upright at 90 degrees   Oral Care Recommendations: Oral care BID        Mahala Menghini., M.A. CCC-SLP Acute Rehabilitation Services Pager [phone] Office [phone]  08/31/2020,2:54 PM

## 2020-08-31 NOTE — TOC CAGE-AID Note (Signed)
Transition of Care [facility]) - CAGE-AID Screening   Patient Details  Name: Tony Combs MRN: [id number] Date of Birth: [birth year]  Transition of Care [facility]) CM/SW Contact:    Naleigha Raimondi C Tarpley-Carter, LCSWA Phone Number: 08/31/2020, 1:25 PM   Clinical Narrative: Pt is unable to participate in Cage Aid, due to slurred speech.   Silva Aamodt Tarpley-Carter, MSW, LCSW-A Pronouns:  She/Her/Hers                          [place] Clinical Social WorkerTransitions of Care Cell:  [phone] [email or web address]    CAGE-AID Screening: Substance Abuse Screening unable to be completed due to: : Patient unable to participate ((Slurred Speech))

## 2020-09-01 DIAGNOSIS — I639 Cerebral infarction, unspecified: Secondary | ICD-10-CM | POA: Diagnosis not present

## 2020-09-01 DIAGNOSIS — E1169 Type 2 diabetes mellitus with other specified complication: Secondary | ICD-10-CM

## 2020-09-01 DIAGNOSIS — I5021 Acute systolic (congestive) heart failure: Secondary | ICD-10-CM

## 2020-09-01 LAB — GLUCOSE, CAPILLARY
Glucose-Capillary: 195 mg/dL — ABNORMAL HIGH (ref 70–99)
Glucose-Capillary: 277 mg/dL — ABNORMAL HIGH (ref 70–99)
Glucose-Capillary: 301 mg/dL — ABNORMAL HIGH (ref 70–99)
Glucose-Capillary: 220 mg/dL — ABNORMAL HIGH (ref 70–99)
Glucose-Capillary: 215 mg/dL — ABNORMAL HIGH (ref 70–99)

## 2020-09-01 LAB — CBC
HCT: 44.5 % (ref 39.0–52.0)
Hemoglobin: 14.2 g/dL (ref 13.0–17.0)
MCH: 27.7 pg (ref 26.0–34.0)
MCHC: 31.9 g/dL (ref 30.0–36.0)
MCV: 86.7 fL (ref 80.0–100.0)
Platelets: 229 10*3/uL (ref 150–400)
RBC: 5.13 MIL/uL (ref 4.22–5.81)
RDW: 13.5 % (ref 11.5–15.5)
WBC: 7.5 10*3/uL (ref 4.0–10.5)
nRBC: 0 % (ref 0.0–0.2)

## 2020-09-01 LAB — RENAL FUNCTION PANEL
Albumin: 3.2 g/dL — ABNORMAL LOW (ref 3.5–5.0)
Anion gap: 5 (ref 5–15)
BUN: 8 mg/dL (ref 8–23)
CO2: 32 mmol/L (ref 22–32)
Calcium: 9 mg/dL (ref 8.9–10.3)
Chloride: 98 mmol/L (ref 98–111)
Creatinine, Ser: 0.77 mg/dL (ref 0.61–1.24)
GFR, Estimated: >60 mL/min (ref >60)
Glucose, Bld: 178 mg/dL — ABNORMAL HIGH (ref 70–99)
Phosphorus: 4 mg/dL (ref 2.5–4.6)
Potassium: 4.5 mmol/L (ref 3.5–5.1)
Sodium: 135 mmol/L (ref 135–145)

## 2020-09-01 LAB — MAGNESIUM: Magnesium: 2.1 mg/dL (ref 1.7–2.4)

## 2020-09-01 LAB — TSH: TSH: 0.787 u[IU]/mL (ref 0.350–4.500)

## 2020-09-01 MED ORDER — CARVEDILOL 3.125 MG PO TABS
3.1250 mg | ORAL_TABLET | Freq: Two times a day (BID) | ORAL | Status: DC
  Administered 2020-09-01 – 2020-09-02 (×2): 3.125 mg via ORAL
  Filled 2020-09-01 (×2): qty 1

## 2020-09-01 MED ORDER — SACUBITRIL-VALSARTAN 24-26 MG PO TABS
1.0000 | ORAL_TABLET | Freq: Two times a day (BID) | ORAL | Status: DC
  Administered 2020-09-01 – 2020-09-03 (×5): 1 via ORAL
  Filled 2020-09-01 (×6): qty 1

## 2020-09-01 MED ORDER — CLOPIDOGREL BISULFATE 75 MG PO TABS
75.0000 mg | ORAL_TABLET | Freq: Every day | ORAL | Status: DC
  Administered 2020-09-01 – 2020-09-03 (×3): 75 mg via ORAL
  Filled 2020-09-01 (×3): qty 1

## 2020-09-01 NOTE — Progress Notes (Signed)
Physical Therapy Treatment Patient Details Name: Tony Combs MRN: [id number] DOB: 02-Mar-1960 Today's Date: 09/01/2020    History of Present Illness 60 y/o male presented to ED on 7/14 with complaints of double vision, blurry vision, and slurred speech which has been flucuating since May. CT head negative. 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 PMH: DM type 2, HTN    PT Comments    Patient progressing towards physical therapy goals. Patient requires supervision for safety during ambulation due to dizziness, double vision with upward gaze, and unsteadiness. Patient able to self correct minor LOB. Slurred speech increased with fatigue. Patient with slow guarded gait throughout. Recommend OPPT following discharge to address strength and balance deficits.     Follow Up Recommendations  Outpatient PT;Supervision for mobility/OOB     Equipment Recommendations  None recommended by PT    Recommendations for Other Services       Precautions / Restrictions Precautions Precautions: Fall Precaution Comments: double vision when looking up Restrictions Weight Bearing Restrictions: No    Mobility  Bed Mobility Overal bed mobility: Modified Independent                  Transfers Overall transfer level: Needs assistance Equipment used: None Transfers: Sit to/from Stand Sit to Stand: Supervision         General transfer comment: supervision for safety  Ambulation/Gait Ambulation/Gait assistance: Supervision Gait Distance (Feet): 250 Feet Assistive device: None Gait Pattern/deviations: Step-through pattern;Decreased stride length;Drifts right/left Gait velocity: decreased   General Gait Details: guarded gait pattern due to dizziness with head turns. Increasingly slurred speech but seems related to fatigue. Supervision for safety. With vertical head turns, patient with unsteadiness but able to self  correct.   Stairs             Wheelchair Mobility    Modified Rankin (Stroke Patients Only) Modified Rankin (Stroke Patients Only) Pre-Morbid Rankin Score: No symptoms Modified Rankin: Moderately severe disability     Balance Overall balance assessment: Mild deficits observed, not formally tested                                          Cognition Arousal/Alertness: Awake/alert Behavior During Therapy: WFL for tasks assessed/performed Overall Cognitive Status: Within Functional Limits for tasks assessed                                        Exercises      General Comments        Pertinent Vitals/Pain Pain Assessment: Faces Faces Pain Scale: No hurt Pain Intervention(s): Monitored during session    Home Living                      Prior Function            PT Goals (current goals can now be found in the care plan section) Acute Rehab PT Goals Patient Stated Goal: to go home PT Goal Formulation: With patient Time For Goal Achievement: 09/14/20 Potential to Achieve Goals: Good Progress towards PT goals: Progressing toward goals    Frequency    Min 4X/week      PT Plan Current plan remains appropriate    Co-evaluation  AM-PAC PT "6 Clicks" Mobility   Outcome Measure  Help needed turning from your back to your side while in a flat bed without using bedrails?: None Help needed moving from lying on your back to sitting on the side of a flat bed without using bedrails?: None Help needed moving to and from a bed to a chair (including a wheelchair)?: A Little Help needed standing up from a chair using your arms (e.g., wheelchair or bedside chair)?: A Little Help needed to walk in hospital room?: A Little Help needed climbing 3-5 steps with a railing? : A Little 6 Click Score: 20    End of Session Equipment Utilized During Treatment: Gait belt Activity Tolerance: Patient tolerated  treatment well Patient left: in bed;with call bell/phone within reach Nurse Communication: Mobility status PT Visit Diagnosis: Unsteadiness on feet (R26.81)     Time: 1441-1500 PT Time Calculation (min) (ACUTE ONLY): 19 min  Charges:  $Gait Training: 8-22 mins                     Tony Combs A. Tony Combs PT, DPT Acute Rehabilitation Services Pager [phone] Office [phone]    Tony Combs 09/01/2020, 3:18 PM

## 2020-09-01 NOTE — Progress Notes (Addendum)
Progress Note  Patient Name: Tony Combs Date of Encounter: 09/01/2020  [facility] Cardiologist: Bryan Lemma, MD   Subjective   Feeling better, no significant shortness of breath, no chest pain.Speech improving. Eye still bothering him. Walking.   Inpatient Medications    Scheduled Meds:  aspirin EC  81 mg Oral Daily   atorvastatin  80 mg Oral Daily   carvedilol  3.125 mg Oral BID WC   clopidogrel  75 mg Oral Daily   enoxaparin (LOVENOX) injection  40 mg Subcutaneous Q24H   hydrochlorothiazide  25 mg Oral Daily   insulin aspart  0-15 Units Subcutaneous TID WC   insulin aspart  0-5 Units Subcutaneous QHS   insulin aspart  5 Units Subcutaneous TID WC   insulin glargine  15 Units Subcutaneous BID   sacubitril-valsartan  1 tablet Oral BID   Continuous Infusions:  PRN Meds: acetaminophen **OR** acetaminophen (TYLENOL) oral liquid 160 mg/5 mL **OR** acetaminophen, senna-docusate   Vital Signs    Vitals:   08/31/20 1954 08/31/20 2328 09/01/20 0328 09/01/20 0822  BP: (!) 143/92 (!) 145/87 (!) 147/84 (!) 149/92  Pulse: (!) 106 (!) 105 (!) 101 (!) 101  Resp: 17 16 18 18   Temp: 97.8 F (36.6 C) 97.8 F (36.6 C) 98.7 F (37.1 C) 97.9 F (36.6 C)  TempSrc: Oral Oral Oral Oral  SpO2: 95% 96% 95% 94%  Weight:      Height:        Intake/Output Summary (Last 24 hours) at 09/01/2020 1027 Last data filed at 08/31/2020 1700 Gross per 24 hour  Intake 808.83 ml  Output --  Net 808.83 ml   Last 3 Weights 08/30/2020  Weight (lbs) 224 lb  Weight (kg) 101.606 kg      Telemetry    Normal sinus rhythm, no adverse arrhythmias- Personally Reviewed  ECG    08/30/2020-sinus tachycardia 115 with nonspecific ST-T wave changes - Personally Reviewed  Physical Exam   GEN: No acute distress.   Neck: No JVD Cardiac: Tachy reg, no murmurs, rubs, or gallops.  Respiratory: Clear to auscultation bilaterally. GI: Soft, nontender, non-distended  MS: No edema; No  deformity. Neuro:  Nonfocal  Psych: Normal affect   Labs    High Sensitivity Troponin:  No results for input(s): TROPONINIHS in the last 720 hours.    Chemistry Recent Labs  Lab 08/30/20 1037 08/30/20 1049 08/31/20 1024 09/01/20 0339  NA 132* 135 137 135  K 3.6 3.7 4.6 4.5  CL 94* 94* 100 98  CO2 29  --  31 32  GLUCOSE 341* 348* 207* 178*  BUN 11 10 8 8   CREATININE 0.71 0.60* 0.77 0.77  CALCIUM 8.9  --  8.9 9.0  PROT 7.6  --   --   --   ALBUMIN 4.0  --  3.5 3.2*  AST 37  --   --   --   ALT 44  --   --   --   ALKPHOS 83  --   --   --   BILITOT 1.1  --   --   --   GFRNONAA >60  --  >60 >60  ANIONGAP 9  --  6 5     Hematology Recent Labs  Lab 08/30/20 1037 08/30/20 1049 08/31/20 1024 09/01/20 0339  WBC 6.5  --  7.6 7.5  RBC 5.25  --  5.38 5.13  HGB 15.1 16.0 15.3 14.2  HCT 45.1 47.0 46.2 44.5  MCV 85.9  --  85.9 86.7  MCH 28.8  --  28.4 27.7  MCHC 33.5  --  33.1 31.9  RDW 13.5  --  13.6 13.5  PLT 208  --  219 229    BNPNo results for input(s): BNP, PROBNP in the last 168 hours.   DDimer No results for input(s): DDIMER in the last 168 hours.   Radiology    CT HEAD WO CONTRAST  Result Date: 08/30/2020 CLINICAL DATA:  Slurred speech for 1 month EXAM: CT HEAD WITHOUT CONTRAST TECHNIQUE: Contiguous axial images were obtained from the base of the skull through the vertex without intravenous contrast. COMPARISON:  08/29/2020 FINDINGS: Brain: No evidence of acute infarction, hemorrhage, hydrocephalus, extra-axial collection or mass lesion/mass effect. Cavum septum pellucidum incidentally noted. Minimal low-density changes within the periventricular and subcortical white matter compatible with chronic microvascular ischemic change. Vascular: Atherosclerotic calcifications involving the large vessels of the skull base. No unexpected hyperdense vessel. Skull: Normal. Negative for fracture or focal lesion. Sinuses/Orbits: No acute finding. Other: None. IMPRESSION: 1. No  acute intracranial findings. 2. Mild chronic microvascular ischemic change. Electronically Signed   By: Duanne Guess D.O.   On: 08/30/2020 12:01   CT Chest Wo Contrast  Result Date: 08/30/2020 CLINICAL DATA:  Basilar opacities on recent chest x-ray. EXAM: CT CHEST WITHOUT CONTRAST TECHNIQUE: Multidetector CT imaging of the chest was performed following the standard protocol without IV contrast. COMPARISON:  Chest x-ray earlier today FINDINGS: Cardiovascular: The heart size is normal. No substantial pericardial effusion. Mild atherosclerotic calcification is noted in the wall of the thoracic aorta. Mediastinum/Nodes: No mediastinal lymphadenopathy. No evidence for gross hilar lymphadenopathy although assessment is limited by the lack of intravenous contrast on today's study. The esophagus has normal imaging features. There is no axillary lymphadenopathy. Lungs/Pleura: 4 mm right middle lobe nodule identified on image 90/4. Calcified granuloma noted left lower lobe. No suspicious pulmonary nodule or mass. No focal airspace consolidation. There is no evidence of pleural effusion. Upper Abdomen: The liver shows diffusely decreased attenuation suggesting fat deposition. Otherwise unremarkable visualized upper abdomen. Musculoskeletal: No worrisome lytic or sclerotic osseous abnormality. IMPRESSION: 1. No acute findings in the chest. Specifically, no evidence for pulmonary edema, pleural effusion, or focal airspace consolidation. 2. 4 mm right middle lobe pulmonary nodule. No follow-up needed if patient is low-risk. Non-contrast chest CT can be considered in 12 months if patient is high-risk. This recommendation follows the consensus statement: Guidelines for Management of Incidental Pulmonary Nodules Detected on CT Images: From the Fleischner Society 2017; Radiology 2017; 284:228-243. 3. Hepatic steatosis. 4. Aortic Atherosclerosis (ICD10-I70.0). Electronically Signed   By: Kennith Center M.D.   On: 08/30/2020  13:58   MR ANGIO HEAD WO CONTRAST  Result Date: 08/30/2020 CLINICAL DATA:  Neuro deficit, acute, stroke suspected. Additional provided: Patient reports sudden onset slurred speech, double vision and blurry vision yesterday morning. EXAM: MRI HEAD WITHOUT CONTRAST MRA HEAD WITHOUT CONTRAST TECHNIQUE: Multiplanar, multi-echo pulse sequences of the brain and surrounding structures were acquired without intravenous contrast. Angiographic images of the Circle of Willis were acquired using MRA technique without intravenous contrast. COMPARISON:  Head CT head CT 08/30/2020. FINDINGS: MRI HEAD FINDINGS Brain: Mild generalized cerebral atrophy. 12 mm acute infarct within the anteromedial right frontal lobe subcortical white matter/callosal genu. Background mild multifocal T2/FLAIR hyperintensity within the cerebral white matter, nonspecific but compatible with chronic small vessel ischemic disease. Tiny chronic lacunar infarct within the left thalamus. Incidentally noted cavum septum pellucidum and cavum vergae. No evidence of an  intracranial mass. No chronic intracranial blood products. No extra-axial fluid collection. No midline shift. Vascular: Expected proximal arterial flow voids. Skull and upper cervical spine: No focal marrow lesion. Sinuses/Orbits: Visualized orbits show no acute finding. Mild mucosal thickening within a posterior right ethmoid air cell. Mild mucosal thickening within the right maxillary sinus. Other: Left mastoid effusion. MRA HEAD FINDINGS Anterior circulation: Tortuosity of the visualized distal cervical internal carotid arteries. The intracranial internal carotid arteries are patent. Atherosclerotic irregularity of both vessels without significant stenosis. The M1 middle cerebral arteries are patent. No M2 proximal branch occlusion or high-grade proximal stenosis is identified. The anterior cerebral arteries are patent. Mild to moderate stenosis within the distal A2 segment of the right  anterior cerebral artery. 1-2 mm inferiorly projecting vascular protrusion arising from the supraclinoid left ICA, which may reflect an infundibulum or small aneurysm. Posterior circulation: The intracranial vertebral arteries are patent. The basilar artery is patent. The posterior cerebral arteries are patent. Severe stenosis within the left posterior cerebral artery at the P1/P2 junction. Anatomic variants: Posterior communicating arteries are hypoplastic or absent bilaterally. IMPRESSION: MRI brain: 1. 12 mm acute infarct within the anteromedial right frontal lobe subcortical white matter/callosal genu (right ACA vascular territory). 2. Background mild generalized cerebral atrophy and cerebral white matter chronic small vessel disease. 3. Tiny chronic lacunar infarct within the left thalamus. 4. Mild paranasal sinus disease, as described. 5. Left mastoid effusion MRA head: 1. No intracranial large vessel occlusion. 2. Intracranial atherosclerotic disease multifocal stenoses, most notably as follows. 3. Mild-to-moderate stenosis within the distal A2 segment of the right anterior cerebral artery. 4. Severe stenosis within the left posterior cerebral artery at the P1/P2 junction. 5. 1-2 mm inferiorly projecting vascular protrusion arising from the supraclinoid left ICA, which may reflect an infundibulum or small aneurysm. Electronically Signed   By: Jackey Loge DO   On: 08/30/2020 14:11   MR ANGIO NECK W WO CONTRAST  Result Date: 08/31/2020 CLINICAL DATA:  Follow-up examination for acute stroke. EXAM: MRA NECK WITHOUT AND WITH CONTRAST TECHNIQUE: Multiplanar and multiecho pulse sequences of the neck were obtained without and with intravenous contrast. Angiographic images of the neck were obtained using MRA technique without and with intravenous contrast. CONTRAST:  10mL GADAVIST GADOBUTROL 1 MMOL/ML IV SOLN COMPARISON:  Prior MRI from 08/30/2020. FINDINGS: AORTIC ARCH: Visualized aortic arch normal in caliber  with normal 3 vessel morphology. No hemodynamically significant stenosis about the origin of the great vessels. RIGHT CAROTID SYSTEM: Right CCA widely patent from its origin to the bifurcation without stenosis. No significant atheromatous narrowing or irregularity about the right carotid bulb. Right ICA tortuous but widely patent to the skull base without stenosis, evidence for dissection or occlusion. LEFT CAROTID SYSTEM: Left CCA widely patent from its origin to the bifurcation without stenosis. Minor atheromatous irregularity about the left carotid bulb without significant stenosis. Left ICA tortuous but widely patent distally without stenosis, evidence for dissection or occlusion. VERTEBRAL ARTERIES: Both vertebral arteries arise from the subclavian arteries. Right vertebral artery slightly dominant. No proximal subclavian artery stenosis. Vertebral arteries widely patent without stenosis, evidence for dissection or occlusion. IMPRESSION: Negative MRA of the neck. No hemodynamically significant stenosis, dissection, or other acute vascular abnormality. Electronically Signed   By: Rise Mu M.D.   On: 08/31/2020 07:02   MR Brain Wo Contrast (neuro protocol)  Result Date: 08/30/2020 CLINICAL DATA:  Neuro deficit, acute, stroke suspected. Additional provided: Patient reports sudden onset slurred speech, double vision and blurry vision  yesterday morning. EXAM: MRI HEAD WITHOUT CONTRAST MRA HEAD WITHOUT CONTRAST TECHNIQUE: Multiplanar, multi-echo pulse sequences of the brain and surrounding structures were acquired without intravenous contrast. Angiographic images of the Circle of Willis were acquired using MRA technique without intravenous contrast. COMPARISON:  Head CT head CT 08/30/2020. FINDINGS: MRI HEAD FINDINGS Brain: Mild generalized cerebral atrophy. 12 mm acute infarct within the anteromedial right frontal lobe subcortical white matter/callosal genu. Background mild multifocal T2/FLAIR  hyperintensity within the cerebral white matter, nonspecific but compatible with chronic small vessel ischemic disease. Tiny chronic lacunar infarct within the left thalamus. Incidentally noted cavum septum pellucidum and cavum vergae. No evidence of an intracranial mass. No chronic intracranial blood products. No extra-axial fluid collection. No midline shift. Vascular: Expected proximal arterial flow voids. Skull and upper cervical spine: No focal marrow lesion. Sinuses/Orbits: Visualized orbits show no acute finding. Mild mucosal thickening within a posterior right ethmoid air cell. Mild mucosal thickening within the right maxillary sinus. Other: Left mastoid effusion. MRA HEAD FINDINGS Anterior circulation: Tortuosity of the visualized distal cervical internal carotid arteries. The intracranial internal carotid arteries are patent. Atherosclerotic irregularity of both vessels without significant stenosis. The M1 middle cerebral arteries are patent. No M2 proximal branch occlusion or high-grade proximal stenosis is identified. The anterior cerebral arteries are patent. Mild to moderate stenosis within the distal A2 segment of the right anterior cerebral artery. 1-2 mm inferiorly projecting vascular protrusion arising from the supraclinoid left ICA, which may reflect an infundibulum or small aneurysm. Posterior circulation: The intracranial vertebral arteries are patent. The basilar artery is patent. The posterior cerebral arteries are patent. Severe stenosis within the left posterior cerebral artery at the P1/P2 junction. Anatomic variants: Posterior communicating arteries are hypoplastic or absent bilaterally. IMPRESSION: MRI brain: 1. 12 mm acute infarct within the anteromedial right frontal lobe subcortical white matter/callosal genu (right ACA vascular territory). 2. Background mild generalized cerebral atrophy and cerebral white matter chronic small vessel disease. 3. Tiny chronic lacunar infarct within the  left thalamus. 4. Mild paranasal sinus disease, as described. 5. Left mastoid effusion MRA head: 1. No intracranial large vessel occlusion. 2. Intracranial atherosclerotic disease multifocal stenoses, most notably as follows. 3. Mild-to-moderate stenosis within the distal A2 segment of the right anterior cerebral artery. 4. Severe stenosis within the left posterior cerebral artery at the P1/P2 junction. 5. 1-2 mm inferiorly projecting vascular protrusion arising from the supraclinoid left ICA, which may reflect an infundibulum or small aneurysm. Electronically Signed   By: Jackey Loge DO   On: 08/30/2020 14:11   DG Chest Port 1 View  Result Date: 08/30/2020 CLINICAL DATA:  Dizziness. EXAM: PORTABLE CHEST 1 VIEW COMPARISON:  None. FINDINGS: The heart size and mediastinal contours are within normal limits. Hazy left basilar opacities. No visible pleural effusions or pneumothorax on this single semi-upright radiograph. IMPRESSION: Hazy left basilar opacities, which may represent prominent diaphragmatic/mediastinal fat and/or atelectasis. Infection is difficult to exclude in this area and dedicated PA and lateral radiographs could further characterize if clinically indicated. Electronically Signed   By: Feliberto Harts MD   On: 08/30/2020 11:52   DG Swallowing Func-Speech Pathology  Result Date: 08/31/2020 Formatting of this result is different from the original. Objective Swallowing Evaluation: Type of Study: MBS-Modified Barium Swallow Study  Patient Details Name: Tony Combs MRN: [id number] Date of Birth: 1960/07/08 Today's Date: 08/31/2020 Time: SLP Start Time (ACUTE ONLY): 1339 -SLP Stop Time (ACUTE ONLY): 1349 SLP Time Calculation (min) (ACUTE ONLY): 10 min Past Medical  History: Past Medical History: Diagnosis Date  Diabetes mellitus without complication (HCC)   Hypertension  Past Surgical History: Past Surgical History: Procedure Laterality Date  HERNIA REPAIR   HPI: 60 y/o male presented to ED on 7/14  with complaints of double vision, blurry vision, and slurred speech which has been flucuating since May. CT head negative. 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 PMH: DM type 2, HTN  Subjective: pt alert, pleasant, has c/o slurred speech Assessment / Plan / Recommendation CHL IP CLINICAL IMPRESSIONS 08/31/2020 Clinical Impression Pt has a mild oral dysphagia with relatively intact pharyngeal phase of swallowing. He has incomplete velopharyngeal closure, allowing thin liquids to move anteriorly into the nasopharynx but consistently clearing back down into the pharynx. Pt also reports good sensation and awareness of when this occurs. He has piecemeal swallowing with purees and takes extra time to fully masticate solids. No aspiration occurs across consistencies and there is no significant oral or pharyngeal residue. Recommend continuing with Dys 3 diet and thin liquids. Pt can use a cup or a straw per his preference, but would take smaller volumes at a time to try to reduce nasal regurgitation. SLP Visit Diagnosis Dysphagia, oral phase (R13.11) Attention and concentration deficit following -- Frontal lobe and executive function deficit following -- Impact on safety and function Mild aspiration risk   CHL IP TREATMENT RECOMMENDATION 08/31/2020 Treatment Recommendations Therapy as outlined in treatment plan below   Prognosis 08/31/2020 Prognosis for Safe Diet Advancement Good Barriers to Reach Goals -- Barriers/Prognosis Comment -- CHL IP DIET RECOMMENDATION 08/31/2020 SLP Diet Recommendations Dysphagia 3 (Mech soft) solids;Thin liquid Liquid Administration via Cup;Straw Medication Administration Whole meds with liquid Compensations Slow rate;Small sips/bites Postural Changes Seated upright at 90 degrees   CHL IP OTHER RECOMMENDATIONS 08/31/2020 Recommended Consults -- Oral Care Recommendations Oral care BID Other Recommendations --   CHL IP FOLLOW UP  RECOMMENDATIONS 08/31/2020 Follow up Recommendations Outpatient SLP   CHL IP FREQUENCY AND DURATION 08/31/2020 Speech Therapy Frequency (ACUTE ONLY) min 2x/week Treatment Duration 2 weeks      CHL IP ORAL PHASE 08/31/2020 Oral Phase Impaired Oral - Pudding Teaspoon -- Oral - Pudding Cup -- Oral - Honey Teaspoon -- Oral - Honey Cup -- Oral - Nectar Teaspoon -- Oral - Nectar Cup -- Oral - Nectar Straw -- Oral - Thin Teaspoon -- Oral - Thin Cup Nasal reflux Oral - Thin Straw Nasal reflux Oral - Puree Piecemeal swallowing Oral - Mech Soft -- Oral - Regular Impaired mastication Oral - Multi-Consistency -- Oral - Pill WFL Oral Phase - Comment --  CHL IP PHARYNGEAL PHASE 08/31/2020 Pharyngeal Phase WFL Pharyngeal- Pudding Teaspoon -- Pharyngeal -- Pharyngeal- Pudding Cup -- Pharyngeal -- Pharyngeal- Honey Teaspoon -- Pharyngeal -- Pharyngeal- Honey Cup -- Pharyngeal -- Pharyngeal- Nectar Teaspoon -- Pharyngeal -- Pharyngeal- Nectar Cup -- Pharyngeal -- Pharyngeal- Nectar Straw -- Pharyngeal -- Pharyngeal- Thin Teaspoon -- Pharyngeal -- Pharyngeal- Thin Cup -- Pharyngeal -- Pharyngeal- Thin Straw -- Pharyngeal -- Pharyngeal- Puree -- Pharyngeal -- Pharyngeal- Mechanical Soft -- Pharyngeal -- Pharyngeal- Regular -- Pharyngeal -- Pharyngeal- Multi-consistency -- Pharyngeal -- Pharyngeal- Pill -- Pharyngeal -- Pharyngeal Comment --  CHL IP CERVICAL ESOPHAGEAL PHASE 08/31/2020 Cervical Esophageal Phase WFL Pudding Teaspoon -- Pudding Cup -- Honey Teaspoon -- Honey Cup -- Nectar Teaspoon -- Nectar Cup -- Nectar Straw -- Thin Teaspoon -- Thin Cup -- Thin Straw -- Puree -- Mechanical Soft -- Regular -- Multi-consistency --  Pill -- Cervical Esophageal Comment -- Mahala Menghini., M.A. CCC-SLP Acute Rehabilitation Services Pager [phone] Office [phone] 08/31/2020, 3:04 PM              ECHOCARDIOGRAM COMPLETE  Result Date: 08/31/2020    ECHOCARDIOGRAM REPORT   Patient Name:   Tony Combs Date of Exam: 08/31/2020 Medical Rec #:   [id number]      Height:       68.0 in Accession #:    [id number]     Weight:       224.0 lb Date of Birth:  January 02, 1961       BSA:          2.145 m Patient Age:    60 years       BP:           148/100 mmHg Patient Gender: M              HR:           106 bpm. Exam Location:  Inpatient Procedure: 2D Echo, Cardiac Doppler and Color Doppler Indications:    TIA  History:        Patient has no prior history of Echocardiogram examinations.                 Risk Factors:Hypertension and Diabetes.  Sonographer:    Shirlean Kelly Referring Phys: [phone] Heloise Beecham EMOKPAE IMPRESSIONS  1. No LV thrombus on contrast imaging. Left ventricular ejection fraction, by estimation, is 30 to 35%. The left ventricle has moderately decreased function. The left ventricle demonstrates global hypokinesis. There is moderate concentric left ventricular hypertrophy. Indeterminate diastolic filling due to E-A fusion.  2. Right ventricular systolic function is normal. The right ventricular size is normal. Tricuspid regurgitation signal is inadequate for assessing PA pressure.  3. Left atrial size was mildly dilated.  4. The mitral valve is grossly normal. Trivial mitral valve regurgitation. No evidence of mitral stenosis.  5. The aortic valve is tricuspid. Aortic valve regurgitation is not visualized. No aortic stenosis is present. Comparison(s): No prior Echocardiogram. FINDINGS  Left Ventricle: No LV thrombus on contrast imaging. Left ventricular ejection fraction, by estimation, is 30 to 35%. The left ventricle has moderately decreased function. The left ventricle demonstrates global hypokinesis. The left ventricular internal cavity size was normal in size. There is moderate concentric left ventricular hypertrophy. Indeterminate diastolic filling due to E-A fusion. Right Ventricle: The right ventricular size is normal. No increase in right ventricular wall thickness. Right ventricular systolic function is normal. Tricuspid regurgitation signal  is inadequate for assessing PA pressure. Left Atrium: Left atrial size was mildly dilated. Right Atrium: Right atrial size was normal in size. Pericardium: Trivial pericardial effusion is present. Mitral Valve: The mitral valve is grossly normal. Trivial mitral valve regurgitation. No evidence of mitral valve stenosis. Tricuspid Valve: The tricuspid valve is grossly normal. Tricuspid valve regurgitation is trivial. No evidence of tricuspid stenosis. Aortic Valve: The aortic valve is tricuspid. Aortic valve regurgitation is not visualized. No aortic stenosis is present. Aortic valve mean gradient measures 3.0 mmHg. Aortic valve peak gradient measures 5.9 mmHg. Aortic valve area, by VTI measures 2.25 cm. Pulmonic Valve: The pulmonic valve was grossly normal. Pulmonic valve regurgitation is not visualized. No evidence of pulmonic stenosis. Aorta: The aortic root and ascending aorta are structurally normal, with no evidence of dilitation. Venous: The inferior vena cava was not well visualized. IAS/Shunts: The interatrial septum was not well visualized.  LEFT VENTRICLE PLAX  2D LVIDd:         4.80 cm LVIDs:         3.80 cm LV PW:         1.30 cm LV IVS:        1.50 cm LVOT diam:     2.30 cm LV SV:         46 LV SV Index:   22 LVOT Area:     4.15 cm  RIGHT VENTRICLE RV Basal diam:  3.30 cm LEFT ATRIUM             Index       RIGHT ATRIUM           Index LA diam:        2.60 cm 1.21 cm/m  RA Area:     11.80 cm LA Vol (A2C):   78.4 ml 36.56 ml/m RA Volume:   22.30 ml  10.40 ml/m LA Vol (A4C):   82.9 ml 38.66 ml/m LA Biplane Vol: 84.6 ml 39.45 ml/m  AORTIC VALVE AV Area (Vmax):    2.38 cm AV Area (Vmean):   2.30 cm AV Area (VTI):     2.25 cm AV Vmax:           121.00 cm/s AV Vmean:          85.600 cm/s AV VTI:            0.206 m AV Peak Grad:      5.9 mmHg AV Mean Grad:      3.0 mmHg LVOT Vmax:         69.35 cm/s LVOT Vmean:        47.450 cm/s LVOT VTI:          0.112 m LVOT/AV VTI ratio: 0.54  AORTA Ao Root diam:  3.40 cm Ao Asc diam:  3.90 cm MITRAL VALVE MV Area (PHT): 4.83 cm     SHUNTS MV Decel Time: 157 msec     Systemic VTI:  0.11 m MV E velocity: 128.00 cm/s  Systemic Diam: 2.30 cm Lennie OdorWesley O'Neal MD Electronically signed by Lennie OdorWesley O'Neal MD Signature Date/Time: 08/31/2020/3:14:30 PM    Final     Cardiac Studies   Echo this admit EF 30 to 35%  Patient Profile     60 y.o. male being seen for congestive heart failure at the request of Dr. Alanda SlimGonfa with history of hypertension diabetes and acute stroke  Assessment & Plan    Acute systolic heart failure/cardiomyopathy unknown etiology - Echocardiogram this admit showed EF of 30 to 35% - Currently does not appear to be volume overloaded - Plan was to stop amlodipine to allow for optimization of goal-directed medical therapy in the setting of heart failure however this seems to be continued - Plan according to consultation note yesterday was to start carvedilol 3.125 mg twice a day and losartan 25 mg daily however I do not see this on current medications.  I will go ahead and start carvedilol 3.125 mg twice a day and instead of losartan, we will start Entresto 24/26 mg p.o. twice daily.  Blood pressure elevated.  I will stop amlodipine.  Tolerating well with most recent blood pressure 149/92  Acute stroke - Out of window of permissive hypertension.  Neurology following.  Notes reviewed. -On Plavix 75 mg.  35 minutes spent with data review, review of chart, patient, review of echocardiogram  For questions or updates, please contact [facility] Please consult [email or web address] for contact info  under        Signed, Donato Schultz, MD  09/01/2020, 10:27 AM

## 2020-09-01 NOTE — Progress Notes (Signed)
STROKE TEAM PROGRESS NOTE   INTERVAL HISTORY His  ex wife  is at the bedside.  Patient is complaining of diplopia on vertical gaze as well as has some dysarthria still.  MRI scan shows right frontal periventricular lacunar infarct in the remote age lacunar infarct in the left thalamus and changes of small vessel disease.  MR angiogram of the brain shows diffuse intracranial atherosclerotic changes.  Urine drug screen is negative.  LDL cholesterol 61 mg percent and hemoglobin A1c is 11.7.  2D echo shows ejection fraction of 30 to 35%.  Cardiology has been consulted  Vitals:   08/31/20 2328 09/01/20 0328 09/01/20 0822 09/01/20 1148  BP: (!) 145/87 (!) 147/84 (!) 149/92 125/85  Pulse: (!) 105 (!) 101 (!) 101 99  Resp: 16 18 18 16   Temp: 97.8 F (36.6 C) 98.7 F (37.1 C) 97.9 F (36.6 C) 98.1 F (36.7 C)  TempSrc: Oral Oral Oral Oral  SpO2: 96% 95% 94% 95%  Weight:      Height:       CBC:  Recent Labs  Lab 08/30/20 1037 08/30/20 1049 08/31/20 1024 09/01/20 0339  WBC 6.5  --  7.6 7.5  NEUTROABS 4.6  --   --   --   HGB 15.1   < > 15.3 14.2  HCT 45.1   < > 46.2 44.5  MCV 85.9  --  85.9 86.7  PLT 208  --  219 229   < > = values in this interval not displayed.   Basic Metabolic Panel:  Recent Labs  Lab 08/31/20 1024 09/01/20 0339  NA 137 135  K 4.6 4.5  CL 100 98  CO2 31 32  GLUCOSE 207* 178*  BUN 8 8  CREATININE 0.77 0.77  CALCIUM 8.9 9.0  MG 2.1 2.1  PHOS 3.2 4.0   Lipid Panel:  Recent Labs  Lab 08/31/20 0321  CHOL 143  TRIG 281*  HDL 26*  CHOLHDL 5.5  VLDL 56*  LDLCALC 61   HgbA1c:  Recent Labs  Lab 08/31/20 0321  HGBA1C 11.7*   Urine Drug Screen:  Recent Labs  Lab 08/30/20 1109  LABOPIA NONE DETECTED  COCAINSCRNUR NONE DETECTED  LABBENZ NONE DETECTED  AMPHETMU NONE DETECTED  THCU NONE DETECTED  LABBARB NONE DETECTED    Alcohol Level  Recent Labs  Lab 08/30/20 1037  ETH <10    IMAGING past 24 hours DG Swallowing Func-Speech  Pathology  Result Date: 08/31/2020 IMPRESSIONS 08/31/2020 Clinical Impression Pt has a mild oral dysphagia with relatively intact pharyngeal phase of swallowing. He has incomplete velopharyngeal closure, allowing thin liquids to move anteriorly into the nasopharynx but consistently clearing back down into the pharynx. Pt also reports good sensation and awareness of when this occurs. He has piecemeal swallowing with purees and takes extra time to fully masticate solids. No aspiration occurs across consistencies and there is no significant oral or pharyngeal residue. Recommend continuing with Dys 3 diet and thin liquids. Pt can use a cup or a straw per his preference, but would take smaller volumes at a time to try to reduce nasal regurgitation. ECHOCARDIOGRAM COMPLETE  Result Date: 08/31/2020 ECHOCARDIOGRAM REPORT  IMPRESSIONS   1. No LV thrombus on contrast imaging. Left ventricular ejection fraction, by estimation, is 30 to 35%. The left ventricle has moderately decreased function. The left ventricle demonstrates global hypokinesis. There is moderate concentric left ventricular hypertrophy. Indeterminate diastolic filling due to E-A fusion.   2. Right ventricular systolic function is normal.  The right ventricular size is normal. Tricuspid regurgitation signal is inadequate for assessing PA pressure.   3. Left atrial size was mildly dilated.   4. The mitral valve is grossly normal. Trivial mitral valve regurgitation. No evidence of mitral stenosis.   5. The aortic valve is tricuspid. Aortic valve regurgitation is not visualized. No aortic stenosis is present.   MRA neck Result date: 09/01/20 IMPRESSION: Negative MRA of the neck. No hemodynamically significant stenosis, dissection, or other acute vascular abnormality.  MRI brain:  Result date: 08/30/20 IMPRESSION: 1. 12 mm acute infarct within the anteromedial right frontal lobe subcortical white matter/callosal genu (right ACA  vascular territory). 2. Background mild generalized cerebral atrophy and cerebral white matter chronic small vessel disease. 3. Tiny chronic lacunar infarct within the left thalamus. 4. Mild paranasal sinus disease 5. Left mastoid effusion   MRA head: Result date: 08/30/20 IMPRESSION: 1. No intracranial large vessel occlusion. 2. Intracranial atherosclerotic disease multifocal stenoses, most notably as follows. 3. Mild-to-moderate stenosis within the distal A2 segment of the right anterior cerebral artery. 4. Severe stenosis within the left posterior cerebral artery at the P1/P2 junction. 5. 1-2 mm inferiorly projecting vascular protrusion arising from the supraclinoid left ICA, which may reflect an infundibulum or small aneurysm.   CT Chest Result date 08/30/20 Impression: IMPRESSION: 1. No acute findings in the chest. Specifically, no evidence for pulmonary edema, pleural effusion, or focal airspace consolidation. 2. 4 mm right middle lobe pulmonary nodule. No follow-up needed if patient is low-risk. Non-contrast chest CT can be considered in 12 months if patient is high-risk. This recommendation follows the consensus statement: Guidelines for Management of Incidental Pulmonary Nodules Detected on CT Images: From the Fleischner Society 2017; Radiology 2017; 284:228-243. 3. Hepatic steatosis. 4. Aortic Atherosclerosis (ICD10-I70.0)  CT head Result date: 08/30/2020 IMPRESSION: 1. No acute intracranial findings. 2. Mild chronic microvascular ischemic change.  PHYSICAL EXAM Obese middle-aged Caucasian male not in distress. . Afebrile. Head is nontraumatic. Neck is supple without bruit.    Cardiac exam no murmur or gallop. Lungs are clear to auscultation. Distal pulses are well felt.  Neurological Exam ;  Awake  Alert oriented x 3.  Slightly dysarthric hypophonic speech and language.eye movements full without nystagmus.fundi were not visualized. Vision acuity and fields appear  normal. Hearing is normal. Palatal movements are normal. Face symmetric. Tongue midline. Normal strength, tone, reflexes and coordination. Normal sensation. Gait deferred.  NIH stroke scale 1 for dysarthria premorbid modified Rankin scale 0  ASSESSMENT/PLAN  [name] is a 60 y.o. male with history of DM and HTN who presented with acute onset of slurred speech, diplopia, and blurry vision.  MRI brain revealed a 12 mm acute ischemic infarction within the anteromedial right frontal lobe, as well as a tiny chronic left thalamic infarction. MRA head revealed no LVO.  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  Suspect small brainstem infarct in addition not visualized on MRI .Marland Kitchenstrokes likely secondary to diffuse atherosclerosis and small vessel disease Code Stroke CT head negative at [facility] MRI brain: acute infarct within the anteromedial right frontal lobe, chronic lacunar infarct within the left thalamus MRA  brain: intracranial atherosclerotic disease multifocal stenoses.  Mild-to-moderate stenosis within the distal A2 segment of the right anterior cerebral artery. Severe stenosis within the left posterior cerebral artery at  the P1/P2 junction. MRA neck: negative 2D Echo: 30 to 35%. The left ventricle has moderately decreased function. The left ventricle demonstrates global hypokinesis. There is moderate concentric LVH LDL 61 HgbA1c 11.7 VTE prophylaxis - Lovenox Diet: Dysphagia 3 No antithrombotic prior to admission, now on aspirin 81 mg daily and clopidogrel 75 mg daily. DAPT for 3 months  then aspirin alone for the treatment of intracranial atherosclerosis Therapy recommendations: No PT follow-up  disposition: Home Hypertension Home meds:  amlodipine and  HCTZ Stable Coreg 3.125 bid, HCTZ 25mg  daily guideline directed medical therapy for reduced EF Long-term BP goal normotensive  Acute systolic heart failure/cardiomyopathy 2D echo: EF of 30 to 35% Cardiology on board Amlodipine stopped on 7/16  Started on carvedilol 3.125 mg twice a day and Entresto 24/26 mg p.o. twice daily. Ischemic evaluation with coronary CTA in 3 to 4 weeks in the outpatient setting and then can be seen in follow-up.  Hyperlipidemia Home meds:  atorvastatin 20mg  daily,  LDL 61, goal < 70 Atorvastatin increased to 80mg  daily Continue statin at discharge  Diabetes type II Uncontrolled Home meds:  glipizide, metformin and Rybelsus HgbA1c 11.7, goal < 7.0 Lantus 10units bid SSI moderate Novolog 5 units TID with meals Consult diabetes coordinator CBGs Recent Labs    08/31/20 2111 09/01/20 0606 09/01/20 1153  GLUCAP 209* 195* 277*    SSI  Other Stroke Risk Factors Obesity, Body mass index is 34.06 kg/m., BMI >/= 30 associated with increased stroke risk, recommend weight loss, diet and exercise as appropriate  Acute systolic heart failure/cardiomyopathy (diagnosed on current admission) 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  Patient did not seek medical attention due to needing to work.   Other Active Problems Incidental finding: 4 mm right middle lobe pulmonary nodule.  PCP to determine if patient needs repeat CT in 12 months  Hospital day # 2  Lissy Olivencia-Simmons, ACNP-BC Stroke NP  I have personally obtained history,examined this patient, reviewed notes, independently viewed imaging studies, participated in medical decision making and plan of care.ROS completed by me personally and pertinent positives fully documented  I have made any additions or clarifications directly to the above note. Agree with note above.  Patient presented with slurred speech, double vision  blurred vision and facial weakness and paresthesias likely due to small brainstem infarct which is not visualized on MRI which shows small right frontal periventricular lacunar stroke as well as an old lacunar stroke in the left thalamus.  Etiology is likely combination of small vessel disease and advanced intracranial atherosclerosis.  Recommend dual antiplatelet therapy of aspirin and Plavix for 3 months followed by aspirin alone.  Aggressive risk factor modification.  Patient appears to be at risk for sleep apnea and is ex-wife has noted him to quit breathing while sleeping.  He is interested in considering participation in the sleep smart study for sleep apnea and stroke prevention and will be given information to review and decide.  Discussed with patient, ex-wife and Dr. [id number].  Greater than 50% time during this 35-minute visit was spent on counseling and coordination of care about his lacunar stroke and suspected sleep apnea and discussion of stroke prevention and treatment and answering questions  09/03/20, MD Medical Director 09/03/20 Stroke Center Pager: [phone] 09/01/2020 1:45 PM  To contact Stroke Continuity provider, please refer to [facility]. After hours, contact General Neurology

## 2020-09-01 NOTE — Progress Notes (Signed)
PROGRESS NOTE    Tony Combs  IEP:[id number] DOB: [birth year] DOA: 08/30/2020 PCP: Juliette Alcide, MD    Brief Narrative:  60 year old M with PMH of DM-2, HTN and obesity presented to [facility] with acute onset diplopia, blurry vision, headache and slurred speech and found to have 12 mm acute infarct within the anterior medial right frontal lobe and tiny chronic infarct in left thalamus.  He was transferred to [facility] for further evaluation and management.  CT head without acute finding.  MRA head and neck without LVO.  He was hyperglycemic to 348.  Assessment & Plan:   Principal Problem:   Acute CVA (cerebrovascular accident) (HCC) Active Problems:   DM (diabetes mellitus) (HCC)   HTN (hypertension)   Pulmonary nodule  Acute CVA-MRI brain with anteromedial right frontal lobe CVA.  Still with some dysarthria and blurry vision but improved.  MRA head and neck without LVO but some mild to severe intracranial small vessel stenosis.  LDL 61.  TG 281.  A1c 11.7%.  Pt had denied smoking cigarette or drinking alcohol. -Neurology following -2d echo with findings of EF 30-35% with global hypokinesis, see below -Continue aspirin and high intensity statin -PT/OT eval -continue to optimize risk reduction   Uncontrolled NIDDM-2 with hyperglycemia: A1c 11.7%.  On glipizide, metformin and Rybelsus at home. -On Lantus 15 units twice daily -Continue SSI-moderate -on NovoLog 3 units 5 times daily with meals -Monitor CBG and adjust insulin as appropriate -diabetic coordinator following   Essential hypertension: On amlodipine and HCTZ at home. -Resume home medications-out of permissive HTN window.   Incidental finding of pulmonary nodule-4 mm RML nodule noted on CT chest.  Cardiology recommended noncontrast CT chest in 12 months if patient is high risk but patient denies ever smoking cigarettes. -Defer to outpatient PCP  New diagnosis of systolic heart failure, chronicity is unknown -2d echo  incidentally noted EF of 30-35% -Appears euvolemic on exam -Cardiology consulted. Recommendation to stop amlodipine to allow optimization of goal-directed therapy -Cont on coreg with entresto started per Cardiology -Pt to f/u closely as outpt   DVT prophylaxis: Lovenox subq Code Status: Full Family Communication: Pt in room, family at bedside  Status is: Inpatient  Remains inpatient appropriate because:Inpatient level of care appropriate due to severity of illness  Dispo: The patient is from: Home              Anticipated d/c is to: Home              Patient currently is not medically stable to d/c.   Difficult to place patient No   Consultants:  Neurology Cardiology  Procedures:    Antimicrobials: Anti-infectives (From admission, onward)    None       Subjective: Reports feeling well this AM  Objective: Vitals:   09/01/20 0328 09/01/20 0822 09/01/20 1148 09/01/20 1539  BP: (!) 147/84 (!) 149/92 125/85 127/85  Pulse: (!) 101 (!) 101 99 (!) 106  Resp: 18 18 16 16   Temp: 98.7 F (37.1 C) 97.9 F (36.6 C) 98.1 F (36.7 C) 98.2 F (36.8 C)  TempSrc: Oral Oral Oral Oral  SpO2: 95% 94% 95% 97%  Weight:      Height:        Intake/Output Summary (Last 24 hours) at 09/01/2020 1610 Last data filed at 08/31/2020 1700 Gross per 24 hour  Intake 10.35 ml  Output --  Net 10.35 ml   Filed Weights   08/30/20 1018  Weight: 101.6 kg  Examination: General exam: Awake, laying in bed, in nad Respiratory system: Normal respiratory effort, no wheezing Cardiovascular system: regular rate, s1, s2 Gastrointestinal system: Soft, nondistended, positive BS Central nervous system: CN2-12 grossly intact, strength intact Extremities: Perfused, no clubbing Skin: Normal skin turgor, no notable skin lesions seen Psychiatry: Mood normal // no visual hallucinations   Data Reviewed: I have personally reviewed following labs and imaging studies  CBC: Recent Labs  Lab  08/30/20 1037 08/30/20 1049 08/31/20 1024 09/01/20 0339  WBC 6.5  --  7.6 7.5  NEUTROABS 4.6  --   --   --   HGB 15.1 16.0 15.3 14.2  HCT 45.1 47.0 46.2 44.5  MCV 85.9  --  85.9 86.7  PLT 208  --  219 229   Basic Metabolic Panel: Recent Labs  Lab 08/30/20 1037 08/30/20 1049 08/31/20 1024 09/01/20 0339  NA 132* 135 137 135  K 3.6 3.7 4.6 4.5  CL 94* 94* 100 98  CO2 29  --  31 32  GLUCOSE 341* 348* 207* 178*  BUN 11 10 8 8   CREATININE 0.71 0.60* 0.77 0.77  CALCIUM 8.9  --  8.9 9.0  MG  --   --  2.1 2.1  PHOS  --   --  3.2 4.0   GFR: Estimated Creatinine Clearance: 110.6 mL/min (by C-G formula based on SCr of 0.77 mg/dL). Liver Function Tests: Recent Labs  Lab 08/30/20 1037 08/31/20 1024 09/01/20 0339  AST 37  --   --   ALT 44  --   --   ALKPHOS 83  --   --   BILITOT 1.1  --   --   PROT 7.6  --   --   ALBUMIN 4.0 3.5 3.2*   No results for input(s): LIPASE, AMYLASE in the last 168 hours. No results for input(s): AMMONIA in the last 168 hours. Coagulation Profile: Recent Labs  Lab 08/30/20 1037  INR 0.9   Cardiac Enzymes: No results for input(s): CKTOTAL, CKMB, CKMBINDEX, TROPONINI in the last 168 hours. BNP (last 3 results) No results for input(s): PROBNP in the last 8760 hours. HbA1C: Recent Labs    08/31/20 0321  HGBA1C 11.7*   CBG: Recent Labs  Lab 08/31/20 1627 08/31/20 2111 09/01/20 0606 09/01/20 1153 09/01/20 1536  GLUCAP 217* 209* 195* 277* 301*   Lipid Profile: Recent Labs    08/31/20 0321  CHOL 143  HDL 26*  LDLCALC 61  TRIG [id number]*  CHOLHDL 5.5   Thyroid Function Tests: Recent Labs    09/01/20 0339  TSH 0.787   Anemia Panel: No results for input(s): VITAMINB12, FOLATE, FERRITIN, TIBC, IRON, RETICCTPCT in the last 72 hours. Sepsis Labs: No results for input(s): PROCALCITON, LATICACIDVEN in the last 168 hours.  Recent Results (from the past 240 hour(s))  Resp Panel by RT-PCR (Flu A&B, Covid) Nasopharyngeal Swab      Status: None   Collection Time: 08/30/20 11:11 AM   Specimen: Nasopharyngeal Swab; Nasopharyngeal(NP) swabs in vial transport medium  Result Value Ref Range Status   SARS Coronavirus 2 by RT PCR NEGATIVE NEGATIVE Final    Comment: (NOTE) SARS-CoV-2 target nucleic acids are NOT DETECTED.  The SARS-CoV-2 RNA is generally detectable in upper respiratory specimens during the acute phase of infection. The lowest concentration of SARS-CoV-2 viral copies this assay can detect is 138 copies/mL. A negative result does not preclude SARS-Cov-2 infection and should not be used as the sole basis for treatment or other patient management  decisions. A negative result may occur with  improper specimen collection/handling, submission of specimen other than nasopharyngeal swab, presence of viral mutation(s) within the areas targeted by this assay, and inadequate number of viral copies(<138 copies/mL). A negative result must be combined with clinical observations, patient history, and epidemiological information. The expected result is Negative.  Fact Sheet for Patients:  BloggerCourse.com  Fact Sheet for Healthcare Providers:  SeriousBroker.it  This test is no t yet approved or cleared by the [place] FDA and  has been authorized for detection and/or diagnosis of SARS-CoV-2 by FDA under an Emergency Use Authorization (EUA). This EUA will remain  in effect (meaning this test can be used) for the duration of the COVID-19 declaration under Section 564(b)(1) of the Act, 21 U.S.C.section 360bbb-3(b)(1), unless the authorization is terminated  or revoked sooner.       Influenza A by PCR NEGATIVE NEGATIVE Final   Influenza B by PCR NEGATIVE NEGATIVE Final    Comment: (NOTE) The Xpert Xpress SARS-CoV-2/FLU/RSV plus assay is intended as an aid in the diagnosis of influenza from Nasopharyngeal swab specimens and should not be used as a sole basis  for treatment. Nasal washings and aspirates are unacceptable for Xpert Xpress SARS-CoV-2/FLU/RSV testing.  Fact Sheet for Patients: BloggerCourse.com  Fact Sheet for Healthcare Providers: SeriousBroker.it  This test is not yet approved or cleared by the [place] FDA and has been authorized for detection and/or diagnosis of SARS-CoV-2 by FDA under an Emergency Use Authorization (EUA). This EUA will remain in effect (meaning this test can be used) for the duration of the COVID-19 declaration under Section 564(b)(1) of the Act, 21 U.S.C. section 360bbb-3(b)(1), unless the authorization is terminated or revoked.  Performed at [facility], [address], [place], [place] [address]      Radiology Studies: MR ANGIO NECK W WO CONTRAST  Result Date: 08/31/2020 CLINICAL DATA:  Follow-up examination for acute stroke. EXAM: MRA NECK WITHOUT AND WITH CONTRAST TECHNIQUE: Multiplanar and multiecho pulse sequences of the neck were obtained without and with intravenous contrast. Angiographic images of the neck were obtained using MRA technique without and with intravenous contrast. CONTRAST:  10mL GADAVIST GADOBUTROL 1 MMOL/ML IV SOLN COMPARISON:  Prior MRI from 08/30/2020. FINDINGS: AORTIC ARCH: Visualized aortic arch normal in caliber with normal 3 vessel morphology. No hemodynamically significant stenosis about the origin of the great vessels. RIGHT CAROTID SYSTEM: Right CCA widely patent from its origin to the bifurcation without stenosis. No significant atheromatous narrowing or irregularity about the right carotid bulb. Right ICA tortuous but widely patent to the skull base without stenosis, evidence for dissection or occlusion. LEFT CAROTID SYSTEM: Left CCA widely patent from its origin to the bifurcation without stenosis. Minor atheromatous irregularity about the left carotid bulb without significant stenosis. Left ICA tortuous but widely patent  distally without stenosis, evidence for dissection or occlusion. VERTEBRAL ARTERIES: Both vertebral arteries arise from the subclavian arteries. Right vertebral artery slightly dominant. No proximal subclavian artery stenosis. Vertebral arteries widely patent without stenosis, evidence for dissection or occlusion. IMPRESSION: Negative MRA of the neck. No hemodynamically significant stenosis, dissection, or other acute vascular abnormality. Electronically Signed   By: Rise Mu M.D.   On: 08/31/2020 07:02   DG Swallowing Func-Speech Pathology  Result Date: 08/31/2020 Formatting of this result is different from the original. Objective Swallowing Evaluation: Type of Study: MBS-Modified Barium Swallow Study  Patient Details Name: Tony Combs MRN: [id number] Date of Birth: 09-15-1960 Today's Date: 08/31/2020 Time: SLP  Start Time (ACUTE ONLY): 1339 -SLP Stop Time (ACUTE ONLY): 1349 SLP Time Calculation (min) (ACUTE ONLY): 10 min Past Medical History: Past Medical History: Diagnosis Date  Diabetes mellitus without complication (HCC)   Hypertension  Past Surgical History: Past Surgical History: Procedure Laterality Date  HERNIA REPAIR   HPI: 60 y/o male presented to ED on 7/14 with complaints of double vision, blurry vision, and slurred speech which has been flucuating since May. CT head negative. 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 PMH: DM type 2, HTN  Subjective: pt alert, pleasant, has c/o slurred speech Assessment / Plan / Recommendation CHL IP CLINICAL IMPRESSIONS 08/31/2020 Clinical Impression Pt has a mild oral dysphagia with relatively intact pharyngeal phase of swallowing. He has incomplete velopharyngeal closure, allowing thin liquids to move anteriorly into the nasopharynx but consistently clearing back down into the pharynx. Pt also reports good sensation and awareness of when this occurs. He has piecemeal swallowing with  purees and takes extra time to fully masticate solids. No aspiration occurs across consistencies and there is no significant oral or pharyngeal residue. Recommend continuing with Dys 3 diet and thin liquids. Pt can use a cup or a straw per his preference, but would take smaller volumes at a time to try to reduce nasal regurgitation. SLP Visit Diagnosis Dysphagia, oral phase (R13.11) Attention and concentration deficit following -- Frontal lobe and executive function deficit following -- Impact on safety and function Mild aspiration risk   CHL IP TREATMENT RECOMMENDATION 08/31/2020 Treatment Recommendations Therapy as outlined in treatment plan below   Prognosis 08/31/2020 Prognosis for Safe Diet Advancement Good Barriers to Reach Goals -- Barriers/Prognosis Comment -- CHL IP DIET RECOMMENDATION 08/31/2020 SLP Diet Recommendations Dysphagia 3 (Mech soft) solids;Thin liquid Liquid Administration via Cup;Straw Medication Administration Whole meds with liquid Compensations Slow rate;Small sips/bites Postural Changes Seated upright at 90 degrees   CHL IP OTHER RECOMMENDATIONS 08/31/2020 Recommended Consults -- Oral Care Recommendations Oral care BID Other Recommendations --   CHL IP FOLLOW UP RECOMMENDATIONS 08/31/2020 Follow up Recommendations Outpatient SLP   CHL IP FREQUENCY AND DURATION 08/31/2020 Speech Therapy Frequency (ACUTE ONLY) min 2x/week Treatment Duration 2 weeks      CHL IP ORAL PHASE 08/31/2020 Oral Phase Impaired Oral - Pudding Teaspoon -- Oral - Pudding Cup -- Oral - Honey Teaspoon -- Oral - Honey Cup -- Oral - Nectar Teaspoon -- Oral - Nectar Cup -- Oral - Nectar Straw -- Oral - Thin Teaspoon -- Oral - Thin Cup Nasal reflux Oral - Thin Straw Nasal reflux Oral - Puree Piecemeal swallowing Oral - Mech Soft -- Oral - Regular Impaired mastication Oral - Multi-Consistency -- Oral - Pill WFL Oral Phase - Comment --  CHL IP PHARYNGEAL PHASE 08/31/2020 Pharyngeal Phase WFL Pharyngeal- Pudding Teaspoon -- Pharyngeal --  Pharyngeal- Pudding Cup -- Pharyngeal -- Pharyngeal- Honey Teaspoon -- Pharyngeal -- Pharyngeal- Honey Cup -- Pharyngeal -- Pharyngeal- Nectar Teaspoon -- Pharyngeal -- Pharyngeal- Nectar Cup -- Pharyngeal -- Pharyngeal- Nectar Straw -- Pharyngeal -- Pharyngeal- Thin Teaspoon -- Pharyngeal -- Pharyngeal- Thin Cup -- Pharyngeal -- Pharyngeal- Thin Straw -- Pharyngeal -- Pharyngeal- Puree -- Pharyngeal -- Pharyngeal- Mechanical Soft -- Pharyngeal -- Pharyngeal- Regular -- Pharyngeal -- Pharyngeal- Multi-consistency -- Pharyngeal -- Pharyngeal- Pill -- Pharyngeal -- Pharyngeal Comment --  CHL IP CERVICAL ESOPHAGEAL PHASE 08/31/2020 Cervical Esophageal Phase WFL Pudding Teaspoon -- Pudding Cup -- Honey Teaspoon -- Honey Cup -- Nectar Teaspoon -- Nectar Cup --  Nectar Straw -- Thin Teaspoon -- Thin Cup -- Thin Straw -- Puree -- Mechanical Soft -- Regular -- Multi-consistency -- Pill -- Cervical Esophageal Comment -- Mahala Menghini., M.A. CCC-SLP Acute Rehabilitation Services Pager [phone] Office [phone] 08/31/2020, 3:04 PM              ECHOCARDIOGRAM COMPLETE  Result Date: 08/31/2020    ECHOCARDIOGRAM REPORT   Patient Name:   Tony Combs Date of Exam: 08/31/2020 Medical Rec #:  [id number]      Height:       68.0 in Accession #:    [id number]     Weight:       224.0 lb Date of Birth:  Oct 14, 1960       BSA:          2.145 m Patient Age:    60 years       BP:           148/100 mmHg Patient Gender: M              HR:           106 bpm. Exam Location:  Inpatient Procedure: 2D Echo, Cardiac Doppler and Color Doppler Indications:    TIA  History:        Patient has no prior history of Echocardiogram examinations.                 Risk Factors:Hypertension and Diabetes.  Sonographer:    Shirlean Kelly Referring Phys: [phone] Heloise Beecham EMOKPAE IMPRESSIONS  1. No LV thrombus on contrast imaging. Left ventricular ejection fraction, by estimation, is 30 to 35%. The left ventricle has moderately decreased function. The left  ventricle demonstrates global hypokinesis. There is moderate concentric left ventricular hypertrophy. Indeterminate diastolic filling due to E-A fusion.  2. Right ventricular systolic function is normal. The right ventricular size is normal. Tricuspid regurgitation signal is inadequate for assessing PA pressure.  3. Left atrial size was mildly dilated.  4. The mitral valve is grossly normal. Trivial mitral valve regurgitation. No evidence of mitral stenosis.  5. The aortic valve is tricuspid. Aortic valve regurgitation is not visualized. No aortic stenosis is present. Comparison(s): No prior Echocardiogram. FINDINGS  Left Ventricle: No LV thrombus on contrast imaging. Left ventricular ejection fraction, by estimation, is 30 to 35%. The left ventricle has moderately decreased function. The left ventricle demonstrates global hypokinesis. The left ventricular internal cavity size was normal in size. There is moderate concentric left ventricular hypertrophy. Indeterminate diastolic filling due to E-A fusion. Right Ventricle: The right ventricular size is normal. No increase in right ventricular wall thickness. Right ventricular systolic function is normal. Tricuspid regurgitation signal is inadequate for assessing PA pressure. Left Atrium: Left atrial size was mildly dilated. Right Atrium: Right atrial size was normal in size. Pericardium: Trivial pericardial effusion is present. Mitral Valve: The mitral valve is grossly normal. Trivial mitral valve regurgitation. No evidence of mitral valve stenosis. Tricuspid Valve: The tricuspid valve is grossly normal. Tricuspid valve regurgitation is trivial. No evidence of tricuspid stenosis. Aortic Valve: The aortic valve is tricuspid. Aortic valve regurgitation is not visualized. No aortic stenosis is present. Aortic valve mean gradient measures 3.0 mmHg. Aortic valve peak gradient measures 5.9 mmHg. Aortic valve area, by VTI measures 2.25 cm. Pulmonic Valve: The pulmonic valve  was grossly normal. Pulmonic valve regurgitation is not visualized. No evidence of pulmonic stenosis. Aorta: The aortic root and ascending aorta are structurally normal, with no evidence of dilitation.  Venous: The inferior vena cava was not well visualized. IAS/Shunts: The interatrial septum was not well visualized.  LEFT VENTRICLE PLAX 2D LVIDd:         4.80 cm LVIDs:         3.80 cm LV PW:         1.30 cm LV IVS:        1.50 cm LVOT diam:     2.30 cm LV SV:         46 LV SV Index:   22 LVOT Area:     4.15 cm  RIGHT VENTRICLE RV Basal diam:  3.30 cm LEFT ATRIUM             Index       RIGHT ATRIUM           Index LA diam:        2.60 cm 1.21 cm/m  RA Area:     11.80 cm LA Vol (A2C):   78.4 ml 36.56 ml/m RA Volume:   22.30 ml  10.40 ml/m LA Vol (A4C):   82.9 ml 38.66 ml/m LA Biplane Vol: 84.6 ml 39.45 ml/m  AORTIC VALVE AV Area (Vmax):    2.38 cm AV Area (Vmean):   2.30 cm AV Area (VTI):     2.25 cm AV Vmax:           121.00 cm/s AV Vmean:          85.600 cm/s AV VTI:            0.206 m AV Peak Grad:      5.9 mmHg AV Mean Grad:      3.0 mmHg LVOT Vmax:         69.35 cm/s LVOT Vmean:        47.450 cm/s LVOT VTI:          0.112 m LVOT/AV VTI ratio: 0.54  AORTA Ao Root diam: 3.40 cm Ao Asc diam:  3.90 cm MITRAL VALVE MV Area (PHT): 4.83 cm     SHUNTS MV Decel Time: 157 msec     Systemic VTI:  0.11 m MV E velocity: 128.00 cm/s  Systemic Diam: 2.30 cm Lennie Odor MD Electronically signed by Lennie Odor MD Signature Date/Time: 08/31/2020/3:14:30 PM    Final     Scheduled Meds:  aspirin EC  81 mg Oral Daily   atorvastatin  80 mg Oral Daily   carvedilol  3.125 mg Oral BID WC   clopidogrel  75 mg Oral Daily   enoxaparin (LOVENOX) injection  40 mg Subcutaneous Q24H   hydrochlorothiazide  25 mg Oral Daily   insulin aspart  0-15 Units Subcutaneous TID WC   insulin aspart  0-5 Units Subcutaneous QHS   insulin aspart  5 Units Subcutaneous TID WC   insulin glargine  15 Units Subcutaneous BID    sacubitril-valsartan  1 tablet Oral BID   Continuous Infusions:   LOS: 2 days   Rickey Barbara, MD [facility] Pager On Amion  If 7PM-7AM, please contact night-coverage 09/01/2020, 4:10 PM

## 2020-09-02 DIAGNOSIS — E1169 Type 2 diabetes mellitus with other specified complication: Secondary | ICD-10-CM | POA: Diagnosis not present

## 2020-09-02 DIAGNOSIS — I639 Cerebral infarction, unspecified: Secondary | ICD-10-CM | POA: Diagnosis not present

## 2020-09-02 LAB — COMPREHENSIVE METABOLIC PANEL
ALT: 38 U/L (ref 0–44)
AST: 26 U/L (ref 15–41)
Albumin: 3.2 g/dL — ABNORMAL LOW (ref 3.5–5.0)
Alkaline Phosphatase: 66 U/L (ref 38–126)
Anion gap: 5 (ref 5–15)
BUN: 12 mg/dL (ref 8–23)
CO2: 31 mmol/L (ref 22–32)
Calcium: 8.8 mg/dL — ABNORMAL LOW (ref 8.9–10.3)
Chloride: 101 mmol/L (ref 98–111)
Creatinine, Ser: 0.86 mg/dL (ref 0.61–1.24)
GFR, Estimated: >60 mL/min (ref >60)
Glucose, Bld: 161 mg/dL — ABNORMAL HIGH (ref 70–99)
Potassium: 4.2 mmol/L (ref 3.5–5.1)
Sodium: 137 mmol/L (ref 135–145)
Total Bilirubin: 0.9 mg/dL (ref 0.3–1.2)
Total Protein: 6.5 g/dL (ref 6.5–8.1)

## 2020-09-02 LAB — CBC
HCT: 45.4 % (ref 39.0–52.0)
Hemoglobin: 15.2 g/dL (ref 13.0–17.0)
MCH: 28.4 pg (ref 26.0–34.0)
MCHC: 33.5 g/dL (ref 30.0–36.0)
MCV: 84.9 fL (ref 80.0–100.0)
Platelets: 202 10*3/uL (ref 150–400)
RBC: 5.35 MIL/uL (ref 4.22–5.81)
RDW: 13.4 % (ref 11.5–15.5)
WBC: 8.2 10*3/uL (ref 4.0–10.5)
nRBC: 0 % (ref 0.0–0.2)

## 2020-09-02 LAB — GLUCOSE, CAPILLARY
Glucose-Capillary: 204 mg/dL — ABNORMAL HIGH (ref 70–99)
Glucose-Capillary: 266 mg/dL — ABNORMAL HIGH (ref 70–99)
Glucose-Capillary: 104 mg/dL — ABNORMAL HIGH (ref 70–99)
Glucose-Capillary: 274 mg/dL — ABNORMAL HIGH (ref 70–99)
Glucose-Capillary: 205 mg/dL — ABNORMAL HIGH (ref 70–99)

## 2020-09-02 MED ORDER — CARVEDILOL 6.25 MG PO TABS
6.2500 mg | ORAL_TABLET | Freq: Two times a day (BID) | ORAL | Status: DC
  Administered 2020-09-02 – 2020-09-03 (×2): 6.25 mg via ORAL
  Filled 2020-09-02 (×2): qty 1

## 2020-09-02 NOTE — Progress Notes (Signed)
PROGRESS NOTE    Tony Combs  PYK:[id number] DOB: 17-Aug-1960 DOA: 08/30/2020 PCP: Juliette Alcide, MD    Brief Narrative:  60 year old M with PMH of DM-2, HTN and obesity presented to [facility] with acute onset diplopia, blurry vision, headache and slurred speech and found to have 12 mm acute infarct within the anterior medial right frontal lobe and tiny chronic infarct in left thalamus.  He was transferred to [facility] for further evaluation and management.  CT head without acute finding.  MRA head and neck without LVO.  He was hyperglycemic to 348.  Assessment & Plan:   Principal Problem:   Acute CVA (cerebrovascular accident) (HCC) Active Problems:   DM (diabetes mellitus) (HCC)   HTN (hypertension)   Pulmonary nodule  Acute CVA-MRI brain with anteromedial right frontal lobe CVA.  Still with some dysarthria and blurry vision but improved.  MRA head and neck without LVO but some mild to severe intracranial small vessel stenosis.  LDL 61.  TG 281.  A1c 11.7%.  Pt had denied smoking cigarette or drinking alcohol. -Neurology following -2d echo with findings of EF 30-35% with global hypokinesis, see below -Continue aspirin and high intensity statin -PT/OT eval -continue to optimize risk reduction -Discussed with Neurology. Currently undergoing CPAP study with Neurology recommendations    Uncontrolled NIDDM-2 with hyperglycemia: A1c 11.7%.  On glipizide, metformin and Rybelsus at home. -On Lantus 15 units twice daily -Continue SSI-moderate -on NovoLog 3 units 5 times daily with meals -Monitor CBG and adjust insulin as appropriate -Diabetic coordinator had been following   Essential hypertension: On amlodipine and HCTZ at home. -Resume home medications-out of permissive HTN window.   Incidental finding of pulmonary nodule-4 mm RML nodule noted on CT chest.  Cardiology recommended noncontrast CT chest in 12 months if patient is high risk but patient denies ever smoking cigarettes. -  Recommend outpatient f/u  New diagnosis of systolic heart failure, chronicity is unknown -2d echo incidentally noted EF of 30-35% -Appears euvolemic on exam -Cardiology consulted. Recommendation to stop amlodipine to allow optimization of goal-directed therapy -Cont on coreg with entresto started per Cardiology -Pt to f/u closely as outpt   DVT prophylaxis: Lovenox subq Code Status: Full Family Communication: Pt in room, family at bedside  Status is: Inpatient  Remains inpatient appropriate because:Inpatient level of care appropriate due to severity of illness  Dispo: The patient is from: Home              Anticipated d/c is to: Home              Patient currently is not medically stable to d/c.   Difficult to place patient No   Consultants:  Neurology Cardiology  Procedures:    Antimicrobials: Anti-infectives (From admission, onward)    None       Subjective: Eager to go home soon  Objective: Vitals:   09/01/20 2335 09/02/20 0329 09/02/20 0738 09/02/20 1212  BP: (!) 138/95 136/88 (!) 125/95 122/86  Pulse: (!) 101 98 (!) 102 98  Resp: 17 19 16 18   Temp: 98 F (36.7 C) 97.8 F (36.6 C) 97.7 F (36.5 C) 97.6 F (36.4 C)  TempSrc: Oral Oral Oral Oral  SpO2: 98% 98% 98% 96%  Weight:      Height:       No intake or output data in the 24 hours ending 09/02/20 1601  Filed Weights   08/30/20 1018  Weight: 101.6 kg    Examination: General exam: Conversant,  in no acute distress Respiratory system: normal chest rise, clear, no audible wheezing Cardiovascular system: regular rhythm, s1-s2 Gastrointestinal system: Nondistended, nontender, pos BS Central nervous system: No seizures, no tremors Extremities: No cyanosis, no joint deformities Skin: No rashes, no pallor Psychiatry: Affect normal // no auditory hallucinations   Data Reviewed: I have personally reviewed following labs and imaging studies  CBC: Recent Labs  Lab 08/30/20 1037 08/30/20 1049  08/31/20 1024 09/01/20 0339 09/02/20 0451  WBC 6.5  --  7.6 7.5 8.2  NEUTROABS 4.6  --   --   --   --   HGB 15.1 16.0 15.3 14.2 15.2  HCT 45.1 47.0 46.2 44.5 45.4  MCV 85.9  --  85.9 86.7 84.9  PLT 208  --  219 229 202    Basic Metabolic Panel: Recent Labs  Lab 08/30/20 1037 08/30/20 1049 08/31/20 1024 09/01/20 0339 09/02/20 0451  NA 132* 135 137 135 137  K 3.6 3.7 4.6 4.5 4.2  CL 94* 94* 100 98 101  CO2 29  --  31 32 31  GLUCOSE 341* 348* 207* 178* 161*  BUN 11 10 8 8 12   CREATININE 0.71 0.60* 0.77 0.77 0.86  CALCIUM 8.9  --  8.9 9.0 8.8*  MG  --   --  2.1 2.1  --   PHOS  --   --  3.2 4.0  --     GFR: Estimated Creatinine Clearance: 102.9 mL/min (by C-G formula based on SCr of 0.86 mg/dL). Liver Function Tests: Recent Labs  Lab 08/30/20 1037 08/31/20 1024 09/01/20 0339 09/02/20 0451  AST 37  --   --  26  ALT 44  --   --  38  ALKPHOS 83  --   --  66  BILITOT 1.1  --   --  0.9  PROT 7.6  --   --  6.5  ALBUMIN 4.0 3.5 3.2* 3.2*    No results for input(s): LIPASE, AMYLASE in the last 168 hours. No results for input(s): AMMONIA in the last 168 hours. Coagulation Profile: Recent Labs  Lab 08/30/20 1037  INR 0.9    Cardiac Enzymes: No results for input(s): CKTOTAL, CKMB, CKMBINDEX, TROPONINI in the last 168 hours. BNP (last 3 results) No results for input(s): PROBNP in the last 8760 hours. HbA1C: Recent Labs    08/31/20 0321  HGBA1C 11.7*    CBG: Recent Labs  Lab 09/01/20 1732 09/01/20 2134 09/02/20 0549 09/02/20 1210 09/02/20 1415  GLUCAP 220* 215* 204* 266* 104*    Lipid Profile: Recent Labs    08/31/20 0321  CHOL 143  HDL 26*  LDLCALC 61  TRIG 09/02/20*  CHOLHDL 5.5    Thyroid Function Tests: Recent Labs    09/01/20 0339  TSH 0.787    Anemia Panel: No results for input(s): VITAMINB12, FOLATE, FERRITIN, TIBC, IRON, RETICCTPCT in the last 72 hours. Sepsis Labs: No results for input(s): PROCALCITON, LATICACIDVEN in the last 168  hours.  Recent Results (from the past 240 hour(s))  Resp Panel by RT-PCR (Flu A&B, Covid) Nasopharyngeal Swab     Status: None   Collection Time: 08/30/20 11:11 AM   Specimen: Nasopharyngeal Swab; Nasopharyngeal(NP) swabs in vial transport medium  Result Value Ref Range Status   SARS Coronavirus 2 by RT PCR NEGATIVE NEGATIVE Final    Comment: (NOTE) SARS-CoV-2 target nucleic acids are NOT DETECTED.  The SARS-CoV-2 RNA is generally detectable in upper respiratory specimens during the acute phase of infection. The lowest  concentration of SARS-CoV-2 viral copies this assay can detect is 138 copies/mL. A negative result does not preclude SARS-Cov-2 infection and should not be used as the sole basis for treatment or other patient management decisions. A negative result may occur with  improper specimen collection/handling, submission of specimen other than nasopharyngeal swab, presence of viral mutation(s) within the areas targeted by this assay, and inadequate number of viral copies(<138 copies/mL). A negative result must be combined with clinical observations, patient history, and epidemiological information. The expected result is Negative.  Fact Sheet for Patients:  BloggerCourse.com  Fact Sheet for Healthcare Providers:  SeriousBroker.it  This test is no t yet approved or cleared by the [place] FDA and  has been authorized for detection and/or diagnosis of SARS-CoV-2 by FDA under an Emergency Use Authorization (EUA). This EUA will remain  in effect (meaning this test can be used) for the duration of the COVID-19 declaration under Section 564(b)(1) of the Act, 21 U.S.C.section 360bbb-3(b)(1), unless the authorization is terminated  or revoked sooner.       Influenza A by PCR NEGATIVE NEGATIVE Final   Influenza B by PCR NEGATIVE NEGATIVE Final    Comment: (NOTE) The Xpert Xpress SARS-CoV-2/FLU/RSV plus assay is intended  as an aid in the diagnosis of influenza from Nasopharyngeal swab specimens and should not be used as a sole basis for treatment. Nasal washings and aspirates are unacceptable for Xpert Xpress SARS-CoV-2/FLU/RSV testing.  Fact Sheet for Patients: BloggerCourse.com  Fact Sheet for Healthcare Providers: SeriousBroker.it  This test is not yet approved or cleared by the [place] FDA and has been authorized for detection and/or diagnosis of SARS-CoV-2 by FDA under an Emergency Use Authorization (EUA). This EUA will remain in effect (meaning this test can be used) for the duration of the COVID-19 declaration under Section 564(b)(1) of the Act, 21 U.S.C. section 360bbb-3(b)(1), unless the authorization is terminated or revoked.  Performed at [facility], [address] [place], [place] [address]       Radiology Studies: No results found.  Scheduled Meds:  aspirin EC  81 mg Oral Daily   atorvastatin  80 mg Oral Daily   carvedilol  6.25 mg Oral BID WC   clopidogrel  75 mg Oral Daily   enoxaparin (LOVENOX) injection  40 mg Subcutaneous Q24H   hydrochlorothiazide  25 mg Oral Daily   insulin aspart  0-15 Units Subcutaneous TID WC   insulin aspart  0-5 Units Subcutaneous QHS   insulin aspart  5 Units Subcutaneous TID WC   insulin glargine  15 Units Subcutaneous BID   sacubitril-valsartan  1 tablet Oral BID   Continuous Infusions:   LOS: 3 days   Rickey Barbara, MD [facility] Pager On Amion  If 7PM-7AM, please contact night-coverage 09/02/2020, 4:01 PM

## 2020-09-02 NOTE — Progress Notes (Signed)
Progress Note  Patient Name: Tony LimaRonnie W Beale Date of Encounter: 09/02/2020  [facility]: Bryan Lemmaavid Harding, MD   Subjective   NAEO. Doing well this AM.  Inpatient Medications    Scheduled Meds:  aspirin EC  81 mg Oral Daily   atorvastatin  80 mg Oral Daily   carvedilol  3.125 mg Oral BID WC   clopidogrel  75 mg Oral Daily   enoxaparin (LOVENOX) injection  40 mg Subcutaneous Q24H   hydrochlorothiazide  25 mg Oral Daily   insulin aspart  0-15 Units Subcutaneous TID WC   insulin aspart  0-5 Units Subcutaneous QHS   insulin aspart  5 Units Subcutaneous TID WC   insulin glargine  15 Units Subcutaneous BID   sacubitril-valsartan  1 tablet Oral BID   Continuous Infusions:  PRN Meds: acetaminophen **OR** acetaminophen (TYLENOL) oral liquid 160 mg/5 mL **OR** acetaminophen, senna-docusate   Vital Signs    Vitals:   09/01/20 2024 09/01/20 2335 09/02/20 0329 09/02/20 0738  BP: (!) 140/93 (!) 138/95 136/88 (!) 125/95  Pulse: (!) 102 (!) 101 98 (!) 102  Resp: 17 17 19 16   Temp: 97.7 F (36.5 C) 98 F (36.7 C) 97.8 F (36.6 C) 97.7 F (36.5 C)  TempSrc: Oral Oral Oral Oral  SpO2: 96% 98% 98% 98%  Weight:      Height:       No intake or output data in the 24 hours ending 09/02/20 0926 Last 3 Weights 08/30/2020  Weight (lbs) 224 lb  Weight (kg) 101.606 kg      Telemetry    Sinus 100-110s  - Personally Reviewed  ECG    No new - Personally Reviewed  Physical Exam   GEN: No acute distress.   Neck: No JVD Cardiac: regular rhythm, tachycardic, no murmurs, rubs, or gallops.  Respiratory: Clear to auscultation bilaterally. GI: Soft, nontender, non-distended  MS: No edema; No deformity. Neuro:  Nonfocal  Psych: Normal affect   Labs    High Sensitivity Troponin:  No results for input(s): TROPONINIHS in the last 720 hours.    Chemistry Recent Labs  Lab 08/30/20 1037 08/30/20 1049 08/31/20 1024 09/01/20 0339 09/02/20 0451  NA 132*   < > 137 135  137  K 3.6   < > 4.6 4.5 4.2  CL 94*   < > 100 98 101  CO2 29  --  31 32 31  GLUCOSE 341*   < > 207* 178* 161*  BUN 11   < > 8 8 12   CREATININE 0.71   < > 0.77 0.77 0.86  CALCIUM 8.9  --  8.9 9.0 8.8*  PROT 7.6  --   --   --  6.5  ALBUMIN 4.0  --  3.5 3.2* 3.2*  AST 37  --   --   --  26  ALT 44  --   --   --  38  ALKPHOS 83  --   --   --  66  BILITOT 1.1  --   --   --  0.9  GFRNONAA >60  --  >60 >60 >60  ANIONGAP 9  --  6 5 5    < > = values in this interval not displayed.     Hematology Recent Labs  Lab 08/31/20 1024 09/01/20 0339 09/02/20 0451  WBC 7.6 7.5 8.2  RBC 5.38 5.13 5.35  HGB 15.3 14.2 15.2  HCT 46.2 44.5 45.4  MCV 85.9 86.7 84.9  MCH 28.4  27.7 28.4  MCHC 33.1 31.9 33.5  RDW 13.6 13.5 13.4  PLT 219 229 202    BNPNo results for input(s): BNP, PROBNP in the last 168 hours.   DDimer No results for input(s): DDIMER in the last 168 hours.   Radiology    DG Swallowing Func-Speech Pathology  Result Date: 08/31/2020 Formatting of this result is different from the original. Objective Swallowing Evaluation: Type of Study: MBS-Modified Barium Swallow Study  Patient Details Name: Tony Combs MRN: [id number] Date of Birth: [birth year] Today's Date: 08/31/2020 Time: SLP Start Time (ACUTE ONLY): 1339 -SLP Stop Time (ACUTE ONLY): 1349 SLP Time Calculation (min) (ACUTE ONLY): 10 min Past Medical History: Past Medical History: Diagnosis Date  Diabetes mellitus without complication (HCC)   Hypertension  Past Surgical History: Past Surgical History: Procedure Laterality Date  HERNIA REPAIR   HPI: 60 y/o male presented to ED on 7/14 with complaints of double vision, blurry vision, and slurred speech which has been flucuating since May. CT head negative. 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 PMH: DM type 2, HTN  Subjective: pt alert, pleasant, has c/o slurred speech Assessment / Plan / Recommendation CHL IP CLINICAL  IMPRESSIONS 08/31/2020 Clinical Impression Pt has a mild oral dysphagia with relatively intact pharyngeal phase of swallowing. He has incomplete velopharyngeal closure, allowing thin liquids to move anteriorly into the nasopharynx but consistently clearing back down into the pharynx. Pt also reports good sensation and awareness of when this occurs. He has piecemeal swallowing with purees and takes extra time to fully masticate solids. No aspiration occurs across consistencies and there is no significant oral or pharyngeal residue. Recommend continuing with Dys 3 diet and thin liquids. Pt can use a cup or a straw per his preference, but would take smaller volumes at a time to try to reduce nasal regurgitation. SLP Visit Diagnosis Dysphagia, oral phase (R13.11) Attention and concentration deficit following -- Frontal lobe and executive function deficit following -- Impact on safety and function Mild aspiration risk   CHL IP TREATMENT RECOMMENDATION 08/31/2020 Treatment Recommendations Therapy as outlined in treatment plan below   Prognosis 08/31/2020 Prognosis for Safe Diet Advancement Good Barriers to Reach Goals -- Barriers/Prognosis Comment -- CHL IP DIET RECOMMENDATION 08/31/2020 SLP Diet Recommendations Dysphagia 3 (Mech soft) solids;Thin liquid Liquid Administration via Cup;Straw Medication Administration Whole meds with liquid Compensations Slow rate;Small sips/bites Postural Changes Seated upright at 90 degrees   CHL IP OTHER RECOMMENDATIONS 08/31/2020 Recommended Consults -- Oral Care Recommendations Oral care BID Other Recommendations --   CHL IP FOLLOW UP RECOMMENDATIONS 08/31/2020 Follow up Recommendations Outpatient SLP   CHL IP FREQUENCY AND DURATION 08/31/2020 Speech Therapy Frequency (ACUTE ONLY) min 2x/week Treatment Duration 2 weeks      CHL IP ORAL PHASE 08/31/2020 Oral Phase Impaired Oral - Pudding Teaspoon -- Oral - Pudding Cup -- Oral - Honey Teaspoon -- Oral - Honey Cup -- Oral - Nectar Teaspoon -- Oral  - Nectar Cup -- Oral - Nectar Straw -- Oral - Thin Teaspoon -- Oral - Thin Cup Nasal reflux Oral - Thin Straw Nasal reflux Oral - Puree Piecemeal swallowing Oral - Mech Soft -- Oral - Regular Impaired mastication Oral - Multi-Consistency -- Oral - Pill WFL Oral Phase - Comment --  CHL IP PHARYNGEAL PHASE 08/31/2020 Pharyngeal Phase WFL Pharyngeal- Pudding Teaspoon -- Pharyngeal -- Pharyngeal- Pudding Cup -- Pharyngeal -- Pharyngeal- Honey Teaspoon -- Pharyngeal -- Pharyngeal- Honey Cup --  Pharyngeal -- Pharyngeal- Nectar Teaspoon -- Pharyngeal -- Pharyngeal- Nectar Cup -- Pharyngeal -- Pharyngeal- Nectar Straw -- Pharyngeal -- Pharyngeal- Thin Teaspoon -- Pharyngeal -- Pharyngeal- Thin Cup -- Pharyngeal -- Pharyngeal- Thin Straw -- Pharyngeal -- Pharyngeal- Puree -- Pharyngeal -- Pharyngeal- Mechanical Soft -- Pharyngeal -- Pharyngeal- Regular -- Pharyngeal -- Pharyngeal- Multi-consistency -- Pharyngeal -- Pharyngeal- Pill -- Pharyngeal -- Pharyngeal Comment --  CHL IP CERVICAL ESOPHAGEAL PHASE 08/31/2020 Cervical Esophageal Phase WFL Pudding Teaspoon -- Pudding Cup -- Honey Teaspoon -- Honey Cup -- Nectar Teaspoon -- Nectar Cup -- Nectar Straw -- Thin Teaspoon -- Thin Cup -- Thin Straw -- Puree -- Mechanical Soft -- Regular -- Multi-consistency -- Pill -- Cervical Esophageal Comment -- Mahala Menghini., M.A. CCC-SLP Acute Rehabilitation Services Pager [phone] Office [phone] 08/31/2020, 3:04 PM              ECHOCARDIOGRAM COMPLETE  Result Date: 08/31/2020    ECHOCARDIOGRAM REPORT   Patient Name:   KEIJUAN SCHELLHASE Date of Exam: 08/31/2020 Medical Rec #:  [id number]      Height:       68.0 in Accession #:    [id number]     Weight:       224.0 lb Date of Birth:  09/02/1960       BSA:          2.145 m Patient Age:    60 years       BP:           148/100 mmHg Patient Gender: M              HR:           106 bpm. Exam Location:  Inpatient Procedure: 2D Echo, Cardiac Doppler and Color Doppler Indications:    TIA  History:         Patient has no prior history of Echocardiogram examinations.                 Risk Factors:Hypertension and Diabetes.  Sonographer:    Shirlean Kelly Referring Phys: [id number] Heloise Beecham EMOKPAE IMPRESSIONS  1. No LV thrombus on contrast imaging. Left ventricular ejection fraction, by estimation, is 30 to 35%. The left ventricle has moderately decreased function. The left ventricle demonstrates global hypokinesis. There is moderate concentric left ventricular hypertrophy. Indeterminate diastolic filling due to E-A fusion.  2. Right ventricular systolic function is normal. The right ventricular size is normal. Tricuspid regurgitation signal is inadequate for assessing PA pressure.  3. Left atrial size was mildly dilated.  4. The mitral valve is grossly normal. Trivial mitral valve regurgitation. No evidence of mitral stenosis.  5. The aortic valve is tricuspid. Aortic valve regurgitation is not visualized. No aortic stenosis is present. Comparison(s): No prior Echocardiogram. FINDINGS  Left Ventricle: No LV thrombus on contrast imaging. Left ventricular ejection fraction, by estimation, is 30 to 35%. The left ventricle has moderately decreased function. The left ventricle demonstrates global hypokinesis. The left ventricular internal cavity size was normal in size. There is moderate concentric left ventricular hypertrophy. Indeterminate diastolic filling due to E-A fusion. Right Ventricle: The right ventricular size is normal. No increase in right ventricular wall thickness. Right ventricular systolic function is normal. Tricuspid regurgitation signal is inadequate for assessing PA pressure. Left Atrium: Left atrial size was mildly dilated. Right Atrium: Right atrial size was normal in size. Pericardium: Trivial pericardial effusion is present. Mitral Valve: The mitral valve is grossly normal. Trivial mitral valve regurgitation. No evidence of  mitral valve stenosis. Tricuspid Valve: The tricuspid valve is  grossly normal. Tricuspid valve regurgitation is trivial. No evidence of tricuspid stenosis. Aortic Valve: The aortic valve is tricuspid. Aortic valve regurgitation is not visualized. No aortic stenosis is present. Aortic valve mean gradient measures 3.0 mmHg. Aortic valve peak gradient measures 5.9 mmHg. Aortic valve area, by VTI measures 2.25 cm. Pulmonic Valve: The pulmonic valve was grossly normal. Pulmonic valve regurgitation is not visualized. No evidence of pulmonic stenosis. Aorta: The aortic root and ascending aorta are structurally normal, with no evidence of dilitation. Venous: The inferior vena cava was not well visualized. IAS/Shunts: The interatrial septum was not well visualized.  LEFT VENTRICLE PLAX 2D LVIDd:         4.80 cm LVIDs:         3.80 cm LV PW:         1.30 cm LV IVS:        1.50 cm LVOT diam:     2.30 cm LV SV:         46 LV SV Index:   22 LVOT Area:     4.15 cm  RIGHT VENTRICLE RV Basal diam:  3.30 cm LEFT ATRIUM             Index       RIGHT ATRIUM           Index LA diam:        2.60 cm 1.21 cm/m  RA Area:     11.80 cm LA Vol (A2C):   78.4 ml 36.56 ml/m RA Volume:   22.30 ml  10.40 ml/m LA Vol (A4C):   82.9 ml 38.66 ml/m LA Biplane Vol: 84.6 ml 39.45 ml/m  AORTIC VALVE AV Area (Vmax):    2.38 cm AV Area (Vmean):   2.30 cm AV Area (VTI):     2.25 cm AV Vmax:           121.00 cm/s AV Vmean:          85.600 cm/s AV VTI:            0.206 m AV Peak Grad:      5.9 mmHg AV Mean Grad:      3.0 mmHg LVOT Vmax:         69.35 cm/s LVOT Vmean:        47.450 cm/s LVOT VTI:          0.112 m LVOT/AV VTI ratio: 0.54  AORTA Ao Root diam: 3.40 cm Ao Asc diam:  3.90 cm MITRAL VALVE MV Area (PHT): 4.83 cm     SHUNTS MV Decel Time: 157 msec     Systemic VTI:  0.11 m MV E velocity: 128.00 cm/s  Systemic Diam: 2.30 cm Lennie Odor MD Electronically signed by Lennie Odor MD Signature Date/Time: 08/31/2020/3:14:30 PM    Final     Cardiac Studies   No new   Assessment & Plan    60 y.o. male  being seen for congestive heart failure at the request of Dr. Alanda Slim with history of hypertension diabetes and acute stroke  #Acute systolic heart failure EF30-35 Cont coreg, increase dose today  to 6.25mg  PO BID Cont entresto  #Acute stroke Cont plavix     For questions or updates, please contact [facility] Please consult [email or web address] for contact info under        Signed, Lanier Prude, MD  09/02/2020, 9:26 AM

## 2020-09-02 NOTE — Progress Notes (Signed)
STROKE TEAM PROGRESS NOTE   INTERVAL HISTORY His  ex wife  is at the bedside.  Patient still has some double vision but it is improving and is now limited to only when he looks up.  Speech is improved.  He did sign consent and is participating in the sleep smart study and tested positive for sleep apnea on the overnight NOx 3 monitor.  He will try the CPAP mask tonight.  Vital signs are stable.  No neurological changes.  Vitals:   09/01/20 2335 09/02/20 0329 09/02/20 0738 09/02/20 1212  BP: (!) 138/95 136/88 (!) 125/95 122/86  Pulse: (!) 101 98 (!) 102 98  Resp: 17 19 16 18   Temp: 98 F (36.7 C) 97.8 F (36.6 C) 97.7 F (36.5 C) 97.6 F (36.4 C)  TempSrc: Oral Oral Oral Oral  SpO2: 98% 98% 98% 96%  Weight:      Height:       CBC:  Recent Labs  Lab 08/30/20 1037 08/30/20 1049 09/01/20 0339 09/02/20 0451  WBC 6.5   < > 7.5 8.2  NEUTROABS 4.6  --   --   --   HGB 15.1   < > 14.2 15.2  HCT 45.1   < > 44.5 45.4  MCV 85.9   < > 86.7 84.9  PLT 208   < > 229 202   < > = values in this interval not displayed.   Basic Metabolic Panel:  Recent Labs  Lab 08/31/20 1024 09/01/20 0339 09/02/20 0451  NA 137 135 137  K 4.6 4.5 4.2  CL 100 98 101  CO2 31 32 31  GLUCOSE 207* 178* 161*  BUN 8 8 12   CREATININE 0.77 0.77 0.86  CALCIUM 8.9 9.0 8.8*  MG 2.1 2.1  --   PHOS 3.2 4.0  --    Lipid Panel:  Recent Labs  Lab 08/31/20 0321  CHOL 143  TRIG 281*  HDL 26*  CHOLHDL 5.5  VLDL 56*  LDLCALC 61   HgbA1c:  Recent Labs  Lab 08/31/20 0321  HGBA1C 11.7*   Urine Drug Screen:  Recent Labs  Lab 08/30/20 1109  LABOPIA NONE DETECTED  COCAINSCRNUR NONE DETECTED  LABBENZ NONE DETECTED  AMPHETMU NONE DETECTED  THCU NONE DETECTED  LABBARB NONE DETECTED    Alcohol Level  Recent Labs  Lab 08/30/20 1037  ETH <10    IMAGING past 24 hours DG Swallowing Func-Speech Pathology  Result Date: 08/31/2020 IMPRESSIONS 08/31/2020 Clinical Impression Pt has a mild oral dysphagia  with relatively intact pharyngeal phase of swallowing. He has incomplete velopharyngeal closure, allowing thin liquids to move anteriorly into the nasopharynx but consistently clearing back down into the pharynx. Pt also reports good sensation and awareness of when this occurs. He has piecemeal swallowing with purees and takes extra time to fully masticate solids. No aspiration occurs across consistencies and there is no significant oral or pharyngeal residue. Recommend continuing with Dys 3 diet and thin liquids. Pt can use a cup or a straw per his preference, but would take smaller volumes at a time to try to reduce nasal regurgitation. ECHOCARDIOGRAM COMPLETE  Result Date: 08/31/2020 ECHOCARDIOGRAM REPORT  IMPRESSIONS   1. No LV thrombus on contrast imaging. Left ventricular ejection fraction, by estimation, is 30 to 35%. The left ventricle has moderately decreased function. The left ventricle demonstrates global hypokinesis. There is moderate concentric left ventricular hypertrophy. Indeterminate diastolic filling due to E-A fusion.   2. Right ventricular systolic function is normal.  The right ventricular size is normal. Tricuspid regurgitation signal is inadequate for assessing PA pressure.   3. Left atrial size was mildly dilated.   4. The mitral valve is grossly normal. Trivial mitral valve regurgitation. No evidence of mitral stenosis.   5. The aortic valve is tricuspid. Aortic valve regurgitation is not visualized. No aortic stenosis is present.   MRA neck Result date: 09/01/20 IMPRESSION: Negative MRA of the neck. No hemodynamically significant stenosis, dissection, or other acute vascular abnormality.  MRI brain:  Result date: 08/30/20 IMPRESSION: 1. 12 mm acute infarct within the anteromedial right frontal lobe subcortical white matter/callosal genu (right ACA vascular territory). 2. Background mild generalized cerebral atrophy and cerebral white matter chronic small vessel  disease. 3. Tiny chronic lacunar infarct within the left thalamus. 4. Mild paranasal sinus disease 5. Left mastoid effusion   MRA head: Result date: 08/30/20 IMPRESSION: 1. No intracranial large vessel occlusion. 2. Intracranial atherosclerotic disease multifocal stenoses, most notably as follows. 3. Mild-to-moderate stenosis within the distal A2 segment of the right anterior cerebral artery. 4. Severe stenosis within the left posterior cerebral artery at the P1/P2 junction. 5. 1-2 mm inferiorly projecting vascular protrusion arising from the supraclinoid left ICA, which may reflect an infundibulum or small aneurysm.   CT Chest Result date 08/30/20 Impression: IMPRESSION: 1. No acute findings in the chest. Specifically, no evidence for pulmonary edema, pleural effusion, or focal airspace consolidation. 2. 4 mm right middle lobe pulmonary nodule. No follow-up needed if patient is low-risk. Non-contrast chest CT can be considered in 12 months if patient is high-risk. This recommendation follows the consensus statement: Guidelines for Management of Incidental Pulmonary Nodules Detected on CT Images: From the Fleischner Society 2017; Radiology 2017; 284:228-243. 3. Hepatic steatosis. 4. Aortic Atherosclerosis (ICD10-I70.0)  CT head Result date: 08/30/2020 IMPRESSION: 1. No acute intracranial findings. 2. Mild chronic microvascular ischemic change.  PHYSICAL EXAM Obese middle-aged Caucasian male not in distress. . Afebrile. Head is nontraumatic. Neck is supple without bruit.    Cardiac exam no murmur or gallop. Lungs are clear to auscultation. Distal pulses are well felt.  Neurological Exam ;  Awake  Alert oriented x 3.  Slightly dysarthric hypophonic speech and language.mild subtle ptosis of the left eye.  Left pupil is smaller than the right.  Sensations are symmetric on the face.  Eye movements full without nystagmus.fundi were not visualized. Vision acuity and fields appear  normal. Hearing is normal. Palatal movements are normal. Face symmetric. Tongue midline. Normal strength, tone, reflexes and coordination. Normal sensation. Gait deferred.  NIH stroke scale 1 for dysarthria premorbid modified Rankin scale 0  ASSESSMENT/PLAN  Tony Combs is a 60 y.o. male with history of DM and HTN who presented with acute onset of slurred speech, diplopia, and blurry vision.  MRI brain revealed a 12 mm acute ischemic infarction within the anteromedial right frontal lobe, as well as a tiny chronic left thalamic infarction. MRA head revealed no LVO.  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  Suspect small brainstem infarct in addition not visualized on MRI .Marland Kitchenstrokes likely secondary to diffuse atherosclerosis and small vessel disease Code Stroke CT head negative at [facility] MRI brain: acute infarct within the anteromedial right frontal lobe, chronic lacunar infarct within the left thalamus MRA  brain: intracranial atherosclerotic disease multifocal stenoses.  Mild-to-moderate stenosis within the distal A2 segment of the right anterior cerebral artery. Severe stenosis within the left posterior cerebral artery at the P1/P2 junction. MRA neck: negative 2D Echo: 30 to 35%. The left ventricle has moderately decreased function. The left ventricle demonstrates global hypokinesis. There is moderate concentric LVH LDL 61 HgbA1c 11.7 VTE prophylaxis - Lovenox Diet: Dysphagia 3 No antithrombotic prior to admission, now on aspirin 81 mg daily and clopidogrel 75 mg daily. DAPT for 3 months  then aspirin alone for the treatment of intracranial atherosclerosis Therapy recommendations: No PT follow-up  disposition: Home Hypertension Home meds:  amlodipine and  HCTZ Stable Coreg 3.125 bid, HCTZ 25mg  daily guideline directed medical therapy for reduced EF Long-term BP goal normotensive  Acute systolic heart failure/cardiomyopathy 2D echo: EF of 30 to 35% Cardiology on board Amlodipine stopped on 7/16  Started on carvedilol 3.125 mg twice a day and Entresto 24/26 mg p.o. twice daily. Ischemic evaluation with coronary CTA in 3 to 4 weeks in the outpatient setting and then can be seen in follow-up.  Hyperlipidemia Home meds:  atorvastatin 20mg  daily,  LDL 61, goal < 70 Atorvastatin increased to 80mg  daily Continue statin at discharge  Diabetes type II Uncontrolled Home meds:  glipizide, metformin and Rybelsus HgbA1c 11.7, goal < 7.0 Lantus 10units bid SSI moderate Novolog 5 units TID with meals Consult diabetes coordinator CBGs Recent Labs    09/01/20 2134 09/02/20 0549 09/02/20 1210  GLUCAP 215* 204* 266*    SSI  Other Stroke Risk Factors Obesity, Body mass index is 34.06 kg/m., BMI >/= 30 associated with increased stroke risk, recommend weight loss, diet and exercise as appropriate  Acute systolic heart failure/cardiomyopathy (diagnosed on current admission) 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  Patient did not seek medical attention due to needing to work.   Other Active Problems Incidental finding: 4 mm right middle lobe pulmonary nodule.  PCP to determine if patient needs repeat CT in 12 months  Hospital day # 3    Patient presented with slurred speech, double vision blurred vision and facial weakness and paresthesias likely due to small brainstem infarct which is not visualized on MRI which shows small right frontal periventricular lacunar stroke as well as an old lacunar stroke in the left thalamus.  Etiology is likely combination of small vessel disease and advanced intracranial atherosclerosis.  Continue dual antiplatelet therapy of aspirin and  Plavix for 3 months followed by aspirin alone.  Aggressive risk factor modification.  Patient has signed consent to participate in in the sleep smart study for sleep apnea and stroke prevention and tested positive for sleep apnea on the overnight NOx 3 monitor.  He will have CPAP mask titration trial tonight.  Discussed with patient, ex-wife and Dr. 2135.  Greater than 50% time during this 25-minute visit was spent on counseling and coordination of care about his lacunar stroke and suspected sleep apnea and discussion of stroke prevention and treatment and answering questions  09/04/20, MD Medical Director 09/04/20 Stroke Center Pager: [phone] 09/02/2020 2:12 PM  To contact Stroke Continuity provider, please refer to [facility]. After hours, contact General Neurology

## 2020-09-03 ENCOUNTER — Other Ambulatory Visit (HOSPITAL_COMMUNITY): Payer: Self-pay

## 2020-09-03 DIAGNOSIS — I10 Essential (primary) hypertension: Secondary | ICD-10-CM | POA: Diagnosis not present

## 2020-09-03 DIAGNOSIS — I639 Cerebral infarction, unspecified: Secondary | ICD-10-CM | POA: Diagnosis not present

## 2020-09-03 LAB — GLUCOSE, CAPILLARY
Glucose-Capillary: 195 mg/dL — ABNORMAL HIGH (ref 70–99)
Glucose-Capillary: 216 mg/dL — ABNORMAL HIGH (ref 70–99)

## 2020-09-03 MED ORDER — ASPIRIN 81 MG PO TBEC
81.0000 mg | DELAYED_RELEASE_TABLET | Freq: Every day | ORAL | 0 refills | Status: DC
  Filled 2020-09-03: qty 30, 30d supply, fill #0

## 2020-09-03 MED ORDER — INSULIN GLARGINE (1 UNIT DIAL) 300 UNIT/ML ~~LOC~~ SOPN
17.0000 [IU] | PEN_INJECTOR | Freq: Two times a day (BID) | SUBCUTANEOUS | 0 refills | Status: DC
  Filled 2020-09-03: qty 1.5, fill #0

## 2020-09-03 MED ORDER — INSULIN ASPART PENFILL 100 UNIT/ML ~~LOC~~ SOCT
5.0000 [IU] | Freq: Three times a day (TID) | SUBCUTANEOUS | 0 refills | Status: DC
  Filled 2020-09-03: qty 3, fill #0

## 2020-09-03 MED ORDER — SPIRONOLACTONE 25 MG PO TABS
25.0000 mg | ORAL_TABLET | Freq: Every day | ORAL | 0 refills | Status: DC
  Filled 2020-09-03: qty 30, 30d supply, fill #0

## 2020-09-03 MED ORDER — BLOOD GLUCOSE METER KIT: PACK | 0 refills | Status: DC

## 2020-09-03 MED ORDER — CARVEDILOL 6.25 MG PO TABS
6.2500 mg | ORAL_TABLET | Freq: Two times a day (BID) | ORAL | 0 refills | Status: DC
Start: 2020-09-03 — End: 2021-03-11

## 2020-09-03 MED ORDER — SACUBITRIL-VALSARTAN 24-26 MG PO TABS
1.0000 | ORAL_TABLET | Freq: Two times a day (BID) | ORAL | 0 refills | Status: AC
  Filled 2020-09-03: qty 60, 30d supply, fill #0

## 2020-09-03 MED ORDER — ATORVASTATIN CALCIUM 80 MG PO TABS: 80.0000 mg | ORAL_TABLET | Freq: Every day | ORAL | 0 refills | Status: DC

## 2020-09-03 MED ORDER — ATORVASTATIN CALCIUM 80 MG PO TABS
80.0000 mg | ORAL_TABLET | Freq: Every day | ORAL | 0 refills | Status: DC
  Filled 2020-09-03: qty 30, 30d supply, fill #0

## 2020-09-03 MED ORDER — INSULIN ASPART PENFILL 100 UNIT/ML ~~LOC~~ SOCT: 5.0000 [IU] | Freq: Three times a day (TID) | SUBCUTANEOUS | 0 refills | Status: DC

## 2020-09-03 MED ORDER — ASPIRIN 81 MG PO TBEC: 81.0000 mg | DELAYED_RELEASE_TABLET | Freq: Every day | ORAL | 0 refills | Status: AC

## 2020-09-03 MED ORDER — PEN NEEDLES 30G X 5 MM MISC: 1.0000 "pen " | Freq: Three times a day (TID) | 1 refills | Status: AC

## 2020-09-03 MED ORDER — CARVEDILOL 6.25 MG PO TABS
6.2500 mg | ORAL_TABLET | Freq: Two times a day (BID) | ORAL | 0 refills | Status: DC
  Filled 2020-09-03: qty 60, 30d supply, fill #0

## 2020-09-03 MED ORDER — SPIRONOLACTONE 25 MG PO TABS: 25.0000 mg | ORAL_TABLET | Freq: Every day | ORAL | 0 refills | Status: DC

## 2020-09-03 MED ORDER — CLOPIDOGREL BISULFATE 75 MG PO TABS: 75.0000 mg | ORAL_TABLET | Freq: Every day | ORAL | 0 refills | Status: AC

## 2020-09-03 MED ORDER — INSULIN GLARGINE (1 UNIT DIAL) 300 UNIT/ML ~~LOC~~ SOPN: 17.0000 [IU] | PEN_INJECTOR | Freq: Two times a day (BID) | SUBCUTANEOUS | 0 refills | Status: DC

## 2020-09-03 MED ORDER — INSULIN PEN NEEDLE 29G X 5MM MISC
1.0000 | Freq: Four times a day (QID) | 0 refills | Status: DC
  Filled 2020-09-03: qty 100, fill #0

## 2020-09-03 MED ORDER — SPIRONOLACTONE 25 MG PO TABS
25.0000 mg | ORAL_TABLET | Freq: Every day | ORAL | Status: DC
  Administered 2020-09-03: 25 mg via ORAL
  Filled 2020-09-03: qty 1

## 2020-09-03 MED ORDER — CLOPIDOGREL BISULFATE 75 MG PO TABS
75.0000 mg | ORAL_TABLET | Freq: Every day | ORAL | 0 refills | Status: DC
  Filled 2020-09-03: qty 30, 30d supply, fill #0

## 2020-09-03 NOTE — Progress Notes (Signed)
Patient discharging home per MD order. Reviewed and educated patient on discharge instructions, home medications, and follow up appointments, all questions answered and patient states understanding all instructions. Patient in no acute distress at time of discharge. Transportation home via significant other.

## 2020-09-03 NOTE — Progress Notes (Signed)
Progress Note  Patient Name: Tony Combs Date of Encounter: 09/03/2020  [facility] Cardiologist: Bryan Lemma, MD   Subjective   Feels well, no significant shortness of breath, no chest pain.Speech improving. ambulatory  Inpatient Medications    Scheduled Meds:  aspirin EC  81 mg Oral Daily   atorvastatin  80 mg Oral Daily   carvedilol  6.25 mg Oral BID WC   clopidogrel  75 mg Oral Daily   enoxaparin (LOVENOX) injection  40 mg Subcutaneous Q24H   hydrochlorothiazide  25 mg Oral Daily   insulin aspart  0-15 Units Subcutaneous TID WC   insulin aspart  0-5 Units Subcutaneous QHS   insulin aspart  5 Units Subcutaneous TID WC   insulin glargine  15 Units Subcutaneous BID   sacubitril-valsartan  1 tablet Oral BID   Continuous Infusions:  PRN Meds: acetaminophen **OR** acetaminophen (TYLENOL) oral liquid 160 mg/5 mL **OR** acetaminophen, senna-docusate   Vital Signs    Vitals:   09/02/20 2029 09/02/20 2328 09/03/20 0634 09/03/20 0738  BP: 118/81 (!) 133/96 118/87 125/77  Pulse: 96 95 96 (!) 103  Resp: 16 16  18   Temp: 98 F (36.7 C) 97.8 F (36.6 C) 97.9 F (36.6 C) 98.4 F (36.9 C)  TempSrc: Oral Oral Oral Oral  SpO2: 96% 97% 97% 97%  Weight:      Height:       No intake or output data in the 24 hours ending 09/03/20 0845  Last 3 Weights 08/30/2020  Weight (lbs) 224 lb  Weight (kg) 101.606 kg      Telemetry    Normal sinus rhythm, no adverse arrhythmias- Personally Reviewed  ECG    08/30/2020-sinus tachycardia 115 with nonspecific ST-T wave changes - Personally Reviewed  Physical Exam   GEN: No acute distress.   Neck: No JVD Cardiac: Tachy reg, no murmurs, rubs, or gallops.  Respiratory: Clear to auscultation bilaterally. GI: Soft, nontender, non-distended  MS: No edema; No deformity. Neuro:  Nonfocal  Psych: Normal affect   Labs    High Sensitivity Troponin:  No results for input(s): TROPONINIHS in the last 720 hours.     Chemistry Recent Labs  Lab 08/30/20 1037 08/30/20 1049 08/31/20 1024 09/01/20 0339 09/02/20 0451  NA 132*   < > 137 135 137  K 3.6   < > 4.6 4.5 4.2  CL 94*   < > 100 98 101  CO2 29  --  31 32 31  GLUCOSE 341*   < > 207* 178* 161*  BUN 11   < > 8 8 12   CREATININE 0.71   < > 0.77 0.77 0.86  CALCIUM 8.9  --  8.9 9.0 8.8*  PROT 7.6  --   --   --  6.5  ALBUMIN 4.0  --  3.5 3.2* 3.2*  AST 37  --   --   --  26  ALT 44  --   --   --  38  ALKPHOS 83  --   --   --  66  BILITOT 1.1  --   --   --  0.9  GFRNONAA >60  --  >60 >60 >60  ANIONGAP 9  --  6 5 5    < > = values in this interval not displayed.      Hematology Recent Labs  Lab 08/31/20 1024 09/01/20 0339 09/02/20 0451  WBC 7.6 7.5 8.2  RBC 5.38 5.13 5.35  HGB 15.3 14.2 15.2  HCT 46.2 44.5 45.4  MCV 85.9 86.7 84.9  MCH 28.4 27.7 28.4  MCHC 33.1 31.9 33.5  RDW 13.6 13.5 13.4  PLT 219 229 202     BNPNo results for input(s): BNP, PROBNP in the last 168 hours.   DDimer No results for input(s): DDIMER in the last 168 hours.   Radiology    No results found.  Cardiac Studies   Echo this admit EF 30 to 35%  Patient Profile     60 y.o. male being seen for congestive heart failure at the request of Dr. Alanda Slim with history of hypertension diabetes and acute stroke  Assessment & Plan    Acute systolic heart failure/cardiomyopathy unknown etiology- likely hypertensive - Echocardiogram this admit showed EF of 30 to 35% - Currently does not appear to be volume overloaded - Plan to optimize CHF therapy. Now on Entresto 24/26 mg bid and Coreg 6.25 mg bid. Will discontinue HCTZ and start aldactone 25 mg daily. Plan close follow up as outpatient with further optimization of medication as tolerated. Repeat Echo in 3 months.  2.    Acute stroke - Out of window of permissive hypertension.  Neurology following.  Notes reviewed. -On Plavix 75 mg.  3.    HTN improved control.  For questions or updates, please contact  [facility] Please consult [email or web address] for contact info under        Signed, Ashling Roane [place], MD  09/03/2020, 8:45 AM

## 2020-09-03 NOTE — TOC Transition Note (Signed)
Transition of Care [facility]) - CM/SW Discharge Note   Patient Details  Name: Tony Combs MRN: [id number] Date of Birth: [birth year]  [facility] CM/SW Contact:  Kermit Balo, RN Phone Number: 09/03/2020, 11:37 AM   Clinical Narrative:    Patient lives at home alone but is going to stay with his ex wife at d/c. She is at the bedside and can provide needed supervision.  Recommendations are for outpatient therapy. Pt prefers to attend at [facility] outpatient therapy. Orders in Epic and information on the AVS.  No DME needs.  Ex wife is able to assist with meds and provide needed transportation.    Final next level of care: OP Rehab Barriers to Discharge: No Barriers Identified   Patient Goals and CMS Choice   CMS Medicare.gov Compare Post Acute Care list provided to:: Patient Choice offered to / list presented to : Patient  Discharge Placement                       Discharge Plan and Services   Discharge Planning Services: CM Consult                                 Social Determinants of Health (SDOH) Interventions     Readmission Risk Interventions No flowsheet data found.

## 2020-09-03 NOTE — Progress Notes (Addendum)
Inpatient Diabetes Program Recommendations  AACE/ADA: New Consensus Statement on Inpatient Glycemic Control (2015)  Target Ranges:  Prepandial:   less than 140 mg/dL      Peak postprandial:   less than 180 mg/dL (1-2 hours)      Critically ill patients:  140 - 180 mg/dL   Results for JUSTINO, BOZE (MRN [id number]) as of 09/03/2020 10:31  Ref. Range 09/02/2020 05:49 09/02/2020 12:10 09/02/2020 14:15 09/02/2020 17:05 09/02/2020 22:14  Glucose-Capillary Latest Ref Range: 70 - 99 mg/dL 825 (H) 003 (H) 704 (H) 274 (H) 205 (H)  Results for ELIAM, SNAPP (MRN [id number]) as of 09/03/2020 10:31  Ref. Range 09/03/2020 06:36  Glucose-Capillary Latest Ref Range: 70 - 99 mg/dL 038 (H)    Home DM Meds: Rybelsus 7 mg QD, Metformin 500 mg BID, Glipizide 10 mg BID   Current Orders: Novolog Moderate Correction Scale/ SSI (0-15 units) TID AC + HS          Lantus 15 units BID      Novolog 5 units TID with meals     MD- Please consider:  1. Increase Lantus to 17 units BID (~10% increase)  2. Increase Novolog Meal Coverage to 8 units TID with meals  Patient has taken insulin in the past via insulin pens, however, was taken off within the last three months d/t A1C improvement by PCP.  Patient will need a meter at discharge. Blood glucose meter (#[id number]).   Prefers [employer] & Novolog insulin pens (based on prior use)  Has been educated on Insulin Pen use at home (see DM RN note from 07/15)    --Will follow patient during hospitalization--  Ambrose Finland RN, MSN, CDE Diabetes Coordinator Inpatient Glycemic Control Team Team Pager: [phone] (8a-5p)

## 2020-09-03 NOTE — Discharge Summary (Signed)
Physician Discharge Summary  Tony Combs DJT:[id number] DOB: 08/14/1960 DOA: 08/30/2020  PCP: Curlene Labrum, MD  Admit date: 08/30/2020 Discharge date: 09/03/2020  Admitted From: Home Disposition:  Home  Recommendations for Outpatient Follow-up:  Follow up with PCP in 1-2 weeks Follow up with Neurology as scheduled Follow up with Cardiology as scheduled  Discharge Condition:Stable CODE STATUS:Full Diet recommendation: Diabetic, heart healthy   Brief/Interim Summary: 60 year old M with PMH of DM-2, HTN and obesity presented to [facility] with acute onset diplopia, blurry vision, headache and slurred speech and found to have 12 mm acute infarct within the anterior medial right frontal lobe and tiny chronic infarct in left thalamus.  He was transferred to [facility] for further evaluation and management.  CT head without acute finding.  MRA head and neck without LVO.  He was hyperglycemic to 348.  Discharge Diagnoses:  Principal Problem:   Acute CVA (cerebrovascular accident) ([place]) Active Problems:   DM (diabetes mellitus) ([place])   HTN (hypertension)   Pulmonary nodule  Acute CVA-MRI brain with anteromedial right frontal lobe CVA.  Still with some dysarthria and blurry vision but improved.  MRA head and neck without LVO but some mild to severe intracranial small vessel stenosis.  LDL 61.  TG 281.  A1c 11.7%.  Pt had denied smoking cigarette or drinking alcohol. -Neurology had been following -2d echo with findings of EF 30-35% with global hypokinesis, see below -Continued aspirin and high intensity statin -PT/OT was consulted -Discussed with Neurology. Currently undergoing CPAP study with Neurology recommendations    Uncontrolled NIDDM-2 with hyperglycemia: A1c 11.7%.  On glipizide, metformin and Rybelsus at home. -Continue SSI-moderate while in hospital -Diabetic coordinator had been following -Recommendations for 30SPQZR BID Trulicity and 5 units novolog TID with meals on d/c   Essential  hypertension: On amlodipine and HCTZ at home. -Resume home medications-out of permissive HTN window.   Incidental finding of pulmonary nodule-4 mm RML nodule noted on CT chest.  Cardiology recommended noncontrast CT chest in 12 months if patient is high risk but patient denies ever smoking cigarettes. - Recommend outpatient f/u   New diagnosis of systolic heart failure, chronicity is unknown -2d echo incidentally noted EF of 30-35% -Appears euvolemic on exam -Cardiology consulted. Recommendation to stop amlodipine to allow optimization of goal-directed therapy -Cont on coreg with entresto started per Cardiology -Pt to f/u closely as outpt    Discharge Instructions  Discharge Instructions     Ambulatory referral to Nutrition and Diabetic Education   Complete by: As directed    Ambulatory referral to Physical Therapy   Complete by: As directed    Ambulatory referral to Speech Therapy   Complete by: As directed       Allergies as of 09/03/2020   No Known Allergies      Medication List     STOP taking these medications    amLODipine 10 MG tablet Commonly known as: NORVASC   amoxicillin 500 MG capsule Commonly known as: AMOXIL   glipiZIDE 10 MG tablet Commonly known as: GLUCOTROL   hydrochlorothiazide 25 MG tablet Commonly known as: HYDRODIURIL   ibuprofen 600 MG tablet Commonly known as: ADVIL   metFORMIN 500 MG tablet Commonly known as: GLUCOPHAGE   Rybelsus 7 MG Tabs Generic drug: Semaglutide       TAKE these medications    aspirin 81 MG EC tablet Take 1 tablet (81 mg total) by mouth daily. Swallow whole. Start taking on: September 04, 2020   atorvastatin 80  MG tablet Commonly known as: LIPITOR Take 1 tablet (80 mg total) by mouth daily. Start taking on: September 04, 2020 What changed:  medication strength how much to take   blood glucose meter kit and supplies Dispense based on patient and insurance preference. Use up to four times daily as directed.  (FOR ICD-10 E10.9, E11.9).   carvedilol 6.25 MG tablet Commonly known as: COREG Take 1 tablet (6.25 mg total) by mouth 2 (two) times daily with a meal.   clopidogrel 75 MG tablet Commonly known as: PLAVIX Take 1 tablet (75 mg total) by mouth daily. Start taking on: September 04, 2020   Insulin Aspart PenFill 100 UNIT/ML Soct Inject 5 Units into the skin 3 (three) times daily before meals.   insulin glargine (1 Unit Dial) 300 UNIT/ML Solostar Pen Commonly known as: TOUJEO Inject 17 Units into the skin in the morning and at bedtime.   Pen Needles 30G X 5 MM Misc 1 pen by Does not apply route 4 (four) times daily - after meals and at bedtime.   sacubitril-valsartan 24-26 MG Commonly known as: ENTRESTO Take 1 tablet by mouth 2 (two) times daily.   spironolactone 25 MG tablet Commonly known as: ALDACTONE Take 1 tablet (25 mg total) by mouth daily. Start taking on: September 04, 2020        [place] Follow up.   Specialty: Rehabilitation Why: The outpatient rehab will contact you for the first appointment. If you have not heard from them 24 hours after d/c please call and schedule an appointment Contact information: [place] [address] [phone] [address] [place]        Curlene Labrum, MD. Schedule an appointment as soon as possible for a visit in 2 week(s).   Specialty: Family Medicine Why: Hospital follow up Contact information: [place] [address] [phone]         Leonie Man, MD .   Specialty: Cardiology Contact information: [address] [place] [address] [phone]                No Known Allergies  Consultations: Neurology Cardiology  Procedures/Studies: CT HEAD WO CONTRAST  Result Date: 08/30/2020 CLINICAL DATA:  Slurred speech for 1 month EXAM: CT HEAD WITHOUT CONTRAST TECHNIQUE: Contiguous axial images  were obtained from the base of the skull through the vertex without intravenous contrast. COMPARISON:  08/29/2020 FINDINGS: Brain: No evidence of acute infarction, hemorrhage, hydrocephalus, extra-axial collection or mass lesion/mass effect. Cavum septum pellucidum incidentally noted. Minimal low-density changes within the periventricular and subcortical white matter compatible with chronic microvascular ischemic change. Vascular: Atherosclerotic calcifications involving the large vessels of the skull base. No unexpected hyperdense vessel. Skull: Normal. Negative for fracture or focal lesion. Sinuses/Orbits: No acute finding. Other: None. IMPRESSION: 1. No acute intracranial findings. 2. Mild chronic microvascular ischemic change. Electronically Signed   By: Davina Poke D.O.   On: 08/30/2020 12:01   CT Chest Wo Contrast  Result Date: 08/30/2020 CLINICAL DATA:  Basilar opacities on recent chest x-ray. EXAM: CT CHEST WITHOUT CONTRAST TECHNIQUE: Multidetector CT imaging of the chest was performed following the standard protocol without IV contrast. COMPARISON:  Chest x-ray earlier today FINDINGS: Cardiovascular: The heart size is normal. No substantial pericardial effusion. Mild atherosclerotic calcification is noted in the wall of the thoracic aorta. Mediastinum/Nodes: No mediastinal lymphadenopathy. No evidence for gross hilar lymphadenopathy although assessment  is limited by the lack of intravenous contrast on today's study. The esophagus has normal imaging features. There is no axillary lymphadenopathy. Lungs/Pleura: 4 mm right middle lobe nodule identified on image 90/4. Calcified granuloma noted left lower lobe. No suspicious pulmonary nodule or mass. No focal airspace consolidation. There is no evidence of pleural effusion. Upper Abdomen: The liver shows diffusely decreased attenuation suggesting fat deposition. Otherwise unremarkable visualized upper abdomen. Musculoskeletal: No worrisome lytic or  sclerotic osseous abnormality. IMPRESSION: 1. No acute findings in the chest. Specifically, no evidence for pulmonary edema, pleural effusion, or focal airspace consolidation. 2. 4 mm right middle lobe pulmonary nodule. No follow-up needed if patient is low-risk. Non-contrast chest CT can be considered in 12 months if patient is high-risk. This recommendation follows the consensus statement: Guidelines for Management of Incidental Pulmonary Nodules Detected on CT Images: From the Fleischner Society 2017; Radiology 2017; 284:228-243. 3. Hepatic steatosis. 4. Aortic Atherosclerosis (ICD10-I70.0). Electronically Signed   By: Misty Stanley M.D.   On: 08/30/2020 13:58   MR ANGIO HEAD WO CONTRAST  Result Date: 08/30/2020 CLINICAL DATA:  Neuro deficit, acute, stroke suspected. Additional provided: Patient reports sudden onset slurred speech, double vision and blurry vision yesterday morning. EXAM: MRI HEAD WITHOUT CONTRAST MRA HEAD WITHOUT CONTRAST TECHNIQUE: Multiplanar, multi-echo pulse sequences of the brain and surrounding structures were acquired without intravenous contrast. Angiographic images of the Circle of Willis were acquired using MRA technique without intravenous contrast. COMPARISON:  Head CT head CT 08/30/2020. FINDINGS: MRI HEAD FINDINGS Brain: Mild generalized cerebral atrophy. 12 mm acute infarct within the anteromedial right frontal lobe subcortical white matter/callosal genu. Background mild multifocal T2/FLAIR hyperintensity within the cerebral white matter, nonspecific but compatible with chronic small vessel ischemic disease. Tiny chronic lacunar infarct within the left thalamus. Incidentally noted cavum septum pellucidum and cavum vergae. No evidence of an intracranial mass. No chronic intracranial blood products. No extra-axial fluid collection. No midline shift. Vascular: Expected proximal arterial flow voids. Skull and upper cervical spine: No focal marrow lesion. Sinuses/Orbits: Visualized  orbits show no acute finding. Mild mucosal thickening within a posterior right ethmoid air cell. Mild mucosal thickening within the right maxillary sinus. Other: Left mastoid effusion. MRA HEAD FINDINGS Anterior circulation: Tortuosity of the visualized distal cervical internal carotid arteries. The intracranial internal carotid arteries are patent. Atherosclerotic irregularity of both vessels without significant stenosis. The M1 middle cerebral arteries are patent. No M2 proximal branch occlusion or high-grade proximal stenosis is identified. The anterior cerebral arteries are patent. Mild to moderate stenosis within the distal A2 segment of the right anterior cerebral artery. 1-2 mm inferiorly projecting vascular protrusion arising from the supraclinoid left ICA, which may reflect an infundibulum or small aneurysm. Posterior circulation: The intracranial vertebral arteries are patent. The basilar artery is patent. The posterior cerebral arteries are patent. Severe stenosis within the left posterior cerebral artery at the P1/P2 junction. Anatomic variants: Posterior communicating arteries are hypoplastic or absent bilaterally. IMPRESSION: MRI brain: 1. 12 mm acute infarct within the anteromedial right frontal lobe subcortical white matter/callosal genu (right ACA vascular territory). 2. Background mild generalized cerebral atrophy and cerebral white matter chronic small vessel disease. 3. Tiny chronic lacunar infarct within the left thalamus. 4. Mild paranasal sinus disease, as described. 5. Left mastoid effusion MRA head: 1. No intracranial large vessel occlusion. 2. Intracranial atherosclerotic disease multifocal stenoses, most notably as follows. 3. Mild-to-moderate stenosis within the distal A2 segment of the right anterior cerebral artery. 4. Severe stenosis within the left  posterior cerebral artery at the P1/P2 junction. 5. 1-2 mm inferiorly projecting vascular protrusion arising from the supraclinoid left  ICA, which may reflect an infundibulum or small aneurysm. Electronically Signed   By: Kellie Simmering DO   On: 08/30/2020 14:11   MR ANGIO NECK W WO CONTRAST  Result Date: 08/31/2020 CLINICAL DATA:  Follow-up examination for acute stroke. EXAM: MRA NECK WITHOUT AND WITH CONTRAST TECHNIQUE: Multiplanar and multiecho pulse sequences of the neck were obtained without and with intravenous contrast. Angiographic images of the neck were obtained using MRA technique without and with intravenous contrast. CONTRAST:  54mL GADAVIST GADOBUTROL 1 MMOL/ML IV SOLN COMPARISON:  Prior MRI from 08/30/2020. FINDINGS: AORTIC ARCH: Visualized aortic arch normal in caliber with normal 3 vessel morphology. No hemodynamically significant stenosis about the origin of the great vessels. RIGHT CAROTID SYSTEM: Right CCA widely patent from its origin to the bifurcation without stenosis. No significant atheromatous narrowing or irregularity about the right carotid bulb. Right ICA tortuous but widely patent to the skull base without stenosis, evidence for dissection or occlusion. LEFT CAROTID SYSTEM: Left CCA widely patent from its origin to the bifurcation without stenosis. Minor atheromatous irregularity about the left carotid bulb without significant stenosis. Left ICA tortuous but widely patent distally without stenosis, evidence for dissection or occlusion. VERTEBRAL ARTERIES: Both vertebral arteries arise from the subclavian arteries. Right vertebral artery slightly dominant. No proximal subclavian artery stenosis. Vertebral arteries widely patent without stenosis, evidence for dissection or occlusion. IMPRESSION: Negative MRA of the neck. No hemodynamically significant stenosis, dissection, or other acute vascular abnormality. Electronically Signed   By: Jeannine Boga M.D.   On: 08/31/2020 07:02   MR Brain Wo Contrast (neuro protocol)  Result Date: 08/30/2020 CLINICAL DATA:  Neuro deficit, acute, stroke suspected. Additional  provided: Patient reports sudden onset slurred speech, double vision and blurry vision yesterday morning. EXAM: MRI HEAD WITHOUT CONTRAST MRA HEAD WITHOUT CONTRAST TECHNIQUE: Multiplanar, multi-echo pulse sequences of the brain and surrounding structures were acquired without intravenous contrast. Angiographic images of the Circle of Willis were acquired using MRA technique without intravenous contrast. COMPARISON:  Head CT head CT 08/30/2020. FINDINGS: MRI HEAD FINDINGS Brain: Mild generalized cerebral atrophy. 12 mm acute infarct within the anteromedial right frontal lobe subcortical white matter/callosal genu. Background mild multifocal T2/FLAIR hyperintensity within the cerebral white matter, nonspecific but compatible with chronic small vessel ischemic disease. Tiny chronic lacunar infarct within the left thalamus. Incidentally noted cavum septum pellucidum and cavum vergae. No evidence of an intracranial mass. No chronic intracranial blood products. No extra-axial fluid collection. No midline shift. Vascular: Expected proximal arterial flow voids. Skull and upper cervical spine: No focal marrow lesion. Sinuses/Orbits: Visualized orbits show no acute finding. Mild mucosal thickening within a posterior right ethmoid air cell. Mild mucosal thickening within the right maxillary sinus. Other: Left mastoid effusion. MRA HEAD FINDINGS Anterior circulation: Tortuosity of the visualized distal cervical internal carotid arteries. The intracranial internal carotid arteries are patent. Atherosclerotic irregularity of both vessels without significant stenosis. The M1 middle cerebral arteries are patent. No M2 proximal branch occlusion or high-grade proximal stenosis is identified. The anterior cerebral arteries are patent. Mild to moderate stenosis within the distal A2 segment of the right anterior cerebral artery. 1-2 mm inferiorly projecting vascular protrusion arising from the supraclinoid left ICA, which may reflect an  infundibulum or small aneurysm. Posterior circulation: The intracranial vertebral arteries are patent. The basilar artery is patent. The posterior cerebral arteries are patent. Severe stenosis within the  left posterior cerebral artery at the P1/P2 junction. Anatomic variants: Posterior communicating arteries are hypoplastic or absent bilaterally. IMPRESSION: MRI brain: 1. 12 mm acute infarct within the anteromedial right frontal lobe subcortical white matter/callosal genu (right ACA vascular territory). 2. Background mild generalized cerebral atrophy and cerebral white matter chronic small vessel disease. 3. Tiny chronic lacunar infarct within the left thalamus. 4. Mild paranasal sinus disease, as described. 5. Left mastoid effusion MRA head: 1. No intracranial large vessel occlusion. 2. Intracranial atherosclerotic disease multifocal stenoses, most notably as follows. 3. Mild-to-moderate stenosis within the distal A2 segment of the right anterior cerebral artery. 4. Severe stenosis within the left posterior cerebral artery at the P1/P2 junction. 5. 1-2 mm inferiorly projecting vascular protrusion arising from the supraclinoid left ICA, which may reflect an infundibulum or small aneurysm. Electronically Signed   By: Kellie Simmering DO   On: 08/30/2020 14:11   DG Chest Port 1 View  Result Date: 08/30/2020 CLINICAL DATA:  Dizziness. EXAM: PORTABLE CHEST 1 VIEW COMPARISON:  None. FINDINGS: The heart size and mediastinal contours are within normal limits. Hazy left basilar opacities. No visible pleural effusions or pneumothorax on this single semi-upright radiograph. IMPRESSION: Hazy left basilar opacities, which may represent prominent diaphragmatic/mediastinal fat and/or atelectasis. Infection is difficult to exclude in this area and dedicated PA and lateral radiographs could further characterize if clinically indicated. Electronically Signed   By: Margaretha Sheffield MD   On: 08/30/2020 11:52   DG Swallowing  Func-Speech Pathology  Result Date: 08/31/2020 Formatting of this result is different from the original. Objective Swallowing Evaluation: Type of Study: MBS-Modified Barium Swallow Study  Patient Details Name: ABEER IVERSEN MRN: [id number] Date of Birth: [birth year] Today's Date: 08/31/2020 Time: SLP Start Time (ACUTE ONLY): 1339 -SLP Stop Time (ACUTE ONLY): 1349 SLP Time Calculation (min) (ACUTE ONLY): 10 min Past Medical History: Past Medical History: Diagnosis Date  Diabetes mellitus without complication ([place])   Hypertension  Past Surgical History: Past Surgical History: Procedure Laterality Date  HERNIA REPAIR   HPI: 60 y/o male presented to ED on 7/14 with complaints of double vision, blurry vision, and slurred speech which has been flucuating since May. CT head negative. 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 PMH: DM type 2, HTN  Subjective: pt alert, pleasant, has c/o slurred speech Assessment / Plan / Recommendation CHL IP CLINICAL IMPRESSIONS 08/31/2020 Clinical Impression Pt has a mild oral dysphagia with relatively intact pharyngeal phase of swallowing. He has incomplete velopharyngeal closure, allowing thin liquids to move anteriorly into the nasopharynx but consistently clearing back down into the pharynx. Pt also reports good sensation and awareness of when this occurs. He has piecemeal swallowing with purees and takes extra time to fully masticate solids. No aspiration occurs across consistencies and there is no significant oral or pharyngeal residue. Recommend continuing with Dys 3 diet and thin liquids. Pt can use a cup or a straw per his preference, but would take smaller volumes at a time to try to reduce nasal regurgitation. SLP Visit Diagnosis Dysphagia, oral phase (R13.11) Attention and concentration deficit following -- Frontal lobe and executive function deficit following -- Impact on safety and function Mild aspiration risk    CHL IP TREATMENT RECOMMENDATION 08/31/2020 Treatment Recommendations Therapy as outlined in treatment plan below   Prognosis 08/31/2020 Prognosis for Safe Diet Advancement Good Barriers to Reach Goals -- Barriers/Prognosis Comment -- CHL IP DIET RECOMMENDATION 08/31/2020 SLP Diet Recommendations  Dysphagia 3 (Mech soft) solids;Thin liquid Liquid Administration via Cup;Straw Medication Administration Whole meds with liquid Compensations Slow rate;Small sips/bites Postural Changes Seated upright at 90 degrees   CHL IP OTHER RECOMMENDATIONS 08/31/2020 Recommended Consults -- Oral Care Recommendations Oral care BID Other Recommendations --   CHL IP FOLLOW UP RECOMMENDATIONS 08/31/2020 Follow up Recommendations Outpatient SLP   CHL IP FREQUENCY AND DURATION 08/31/2020 Speech Therapy Frequency (ACUTE ONLY) min 2x/week Treatment Duration 2 weeks      CHL IP ORAL PHASE 08/31/2020 Oral Phase Impaired Oral - Pudding Teaspoon -- Oral - Pudding Cup -- Oral - Honey Teaspoon -- Oral - Honey Cup -- Oral - Nectar Teaspoon -- Oral - Nectar Cup -- Oral - Nectar Straw -- Oral - Thin Teaspoon -- Oral - Thin Cup Nasal reflux Oral - Thin Straw Nasal reflux Oral - Puree Piecemeal swallowing Oral - Mech Soft -- Oral - Regular Impaired mastication Oral - Multi-Consistency -- Oral - Pill WFL Oral Phase - Comment --  CHL IP PHARYNGEAL PHASE 08/31/2020 Pharyngeal Phase WFL Pharyngeal- Pudding Teaspoon -- Pharyngeal -- Pharyngeal- Pudding Cup -- Pharyngeal -- Pharyngeal- Honey Teaspoon -- Pharyngeal -- Pharyngeal- Honey Cup -- Pharyngeal -- Pharyngeal- Nectar Teaspoon -- Pharyngeal -- Pharyngeal- Nectar Cup -- Pharyngeal -- Pharyngeal- Nectar Straw -- Pharyngeal -- Pharyngeal- Thin Teaspoon -- Pharyngeal -- Pharyngeal- Thin Cup -- Pharyngeal -- Pharyngeal- Thin Straw -- Pharyngeal -- Pharyngeal- Puree -- Pharyngeal -- Pharyngeal- Mechanical Soft -- Pharyngeal -- Pharyngeal- Regular -- Pharyngeal -- Pharyngeal- Multi-consistency -- Pharyngeal --  Pharyngeal- Pill -- Pharyngeal -- Pharyngeal Comment --  CHL IP CERVICAL ESOPHAGEAL PHASE 08/31/2020 Cervical Esophageal Phase WFL Pudding Teaspoon -- Pudding Cup -- Honey Teaspoon -- Honey Cup -- Nectar Teaspoon -- Nectar Cup -- Nectar Straw -- Thin Teaspoon -- Thin Cup -- Thin Straw -- Puree -- Mechanical Soft -- Regular -- Multi-consistency -- Pill -- Cervical Esophageal Comment -- Osie Bond., M.A. [place] Pager [phone] Office [phone] 08/31/2020, 3:04 PM              ECHOCARDIOGRAM COMPLETE  Result Date: 08/31/2020    ECHOCARDIOGRAM REPORT   Patient Name:   Tony Combs Date of Exam: 08/31/2020 Medical Rec #:  [id number]      Height:       68.0 in Accession #:    [id number]     Weight:       224.0 lb Date of Birth:  05-20-1960       BSA:          2.145 m Patient Age:    60 years       BP:           148/100 mmHg Patient Gender: M              HR:           106 bpm. Exam Location:  Inpatient Procedure: 2D Echo, Cardiac Doppler and Color Doppler Indications:    TIA  History:        Patient has no prior history of Echocardiogram examinations.                 Risk Factors:Hypertension and Diabetes.  Sonographer:    Cammy Brochure Referring Phys: [phone] [place]  1. No LV thrombus on contrast imaging. Left ventricular ejection fraction, by estimation, is 30 to 35%. The left ventricle has moderately decreased function. The left ventricle demonstrates global hypokinesis. There is moderate concentric left ventricular hypertrophy.  Indeterminate diastolic filling due to E-A fusion.  2. Right ventricular systolic function is normal. The right ventricular size is normal. Tricuspid regurgitation signal is inadequate for assessing PA pressure.  3. Left atrial size was mildly dilated.  4. The mitral valve is grossly normal. Trivial mitral valve regurgitation. No evidence of mitral stenosis.  5. The aortic valve is tricuspid. Aortic valve regurgitation is not  visualized. No aortic stenosis is present. Comparison(s): No prior Echocardiogram. FINDINGS  Left Ventricle: No LV thrombus on contrast imaging. Left ventricular ejection fraction, by estimation, is 30 to 35%. The left ventricle has moderately decreased function. The left ventricle demonstrates global hypokinesis. The left ventricular internal cavity size was normal in size. There is moderate concentric left ventricular hypertrophy. Indeterminate diastolic filling due to E-A fusion. Right Ventricle: The right ventricular size is normal. No increase in right ventricular wall thickness. Right ventricular systolic function is normal. Tricuspid regurgitation signal is inadequate for assessing PA pressure. Left Atrium: Left atrial size was mildly dilated. Right Atrium: Right atrial size was normal in size. Pericardium: Trivial pericardial effusion is present. Mitral Valve: The mitral valve is grossly normal. Trivial mitral valve regurgitation. No evidence of mitral valve stenosis. Tricuspid Valve: The tricuspid valve is grossly normal. Tricuspid valve regurgitation is trivial. No evidence of tricuspid stenosis. Aortic Valve: The aortic valve is tricuspid. Aortic valve regurgitation is not visualized. No aortic stenosis is present. Aortic valve mean gradient measures 3.0 mmHg. Aortic valve peak gradient measures 5.9 mmHg. Aortic valve area, by VTI measures 2.25 cm. Pulmonic Valve: The pulmonic valve was grossly normal. Pulmonic valve regurgitation is not visualized. No evidence of pulmonic stenosis. Aorta: The aortic root and ascending aorta are structurally normal, with no evidence of dilitation. Venous: The inferior vena cava was not well visualized. IAS/Shunts: The interatrial septum was not well visualized.  LEFT VENTRICLE PLAX 2D LVIDd:         4.80 cm LVIDs:         3.80 cm LV PW:         1.30 cm LV IVS:        1.50 cm LVOT diam:     2.30 cm LV SV:         46 LV SV Index:   22 LVOT Area:     4.15 cm  RIGHT  VENTRICLE RV Basal diam:  3.30 cm LEFT ATRIUM             Index       RIGHT ATRIUM           Index LA diam:        2.60 cm 1.21 cm/m  RA Area:     11.80 cm LA Vol (A2C):   78.4 ml 36.56 ml/m RA Volume:   22.30 ml  10.40 ml/m LA Vol (A4C):   82.9 ml 38.66 ml/m LA Biplane Vol: 84.6 ml 39.45 ml/m  AORTIC VALVE AV Area (Vmax):    2.38 cm AV Area (Vmean):   2.30 cm AV Area (VTI):     2.25 cm AV Vmax:           121.00 cm/s AV Vmean:          85.600 cm/s AV VTI:            0.206 m AV Peak Grad:      5.9 mmHg AV Mean Grad:      3.0 mmHg LVOT Vmax:         69.35 cm/s LVOT  Vmean:        47.450 cm/s LVOT VTI:          0.112 m LVOT/AV VTI ratio: 0.54  AORTA Ao Root diam: 3.40 cm Ao Asc diam:  3.90 cm MITRAL VALVE MV Area (PHT): 4.83 cm     SHUNTS MV Decel Time: 157 msec     Systemic VTI:  0.11 m MV E velocity: 128.00 cm/s  Systemic Diam: 2.30 cm Eleonore Chiquito MD Electronically signed by Eleonore Chiquito MD Signature Date/Time: 08/31/2020/3:14:30 PM    Final     Subjective: Very eager to go home  Discharge Exam: Vitals:   09/03/20 0738 09/03/20 1141  BP: 125/77 121/77  Pulse: (!) 103 90  Resp: 18   Temp: 98.4 F (36.9 C) 97.6 F (36.4 C)  SpO2: 97% 96%   Vitals:   09/02/20 2328 09/03/20 0634 09/03/20 0738 09/03/20 1141  BP: (!) 133/96 118/87 125/77 121/77  Pulse: 95 96 (!) 103 90  Resp: 16  18   Temp: 97.8 F (36.6 C) 97.9 F (36.6 C) 98.4 F (36.9 C) 97.6 F (36.4 C)  TempSrc: Oral Oral Oral Oral  SpO2: 97% 97% 97% 96%  Weight:      Height:        General: Pt is alert, awake, not in acute distress Cardiovascular: RRR, S1/S2 + Respiratory: CTA bilaterally, no wheezing, no rhonchi Abdominal: Soft, NT, ND, bowel sounds + Extremities: no edema, no cyanosis   The results of significant diagnostics from this hospitalization (including imaging, microbiology, ancillary and laboratory) are listed below for reference.     Microbiology: Recent Results (from the past 240 hour(s))  Resp  Panel by RT-PCR (Flu A&B, Covid) Nasopharyngeal Swab     Status: None   Collection Time: 08/30/20 11:11 AM   Specimen: Nasopharyngeal Swab; Nasopharyngeal(NP) swabs in vial transport medium  Result Value Ref Range Status   SARS Coronavirus 2 by RT PCR NEGATIVE NEGATIVE Final    Comment: (NOTE) SARS-CoV-2 target nucleic acids are NOT DETECTED.  The SARS-CoV-2 RNA is generally detectable in upper respiratory specimens during the acute phase of infection. The lowest concentration of SARS-CoV-2 viral copies this assay can detect is 138 copies/mL. A negative result does not preclude SARS-Cov-2 infection and should not be used as the sole basis for treatment or other patient management decisions. A negative result may occur with  improper specimen collection/handling, submission of specimen other than nasopharyngeal swab, presence of viral mutation(s) within the areas targeted by this assay, and inadequate number of viral copies(<138 copies/mL). A negative result must be combined with clinical observations, patient history, and epidemiological information. The expected result is Negative.  Fact Sheet for Patients:  EntrepreneurPulse.com.au  Fact Sheet for Healthcare Providers:  IncredibleEmployment.be  This test is no t yet approved or cleared by the [place] FDA and  has been authorized for detection and/or diagnosis of SARS-CoV-2 by FDA under an Emergency Use Authorization (EUA). This EUA will remain  in effect (meaning this test can be used) for the duration of the COVID-19 declaration under Section 564(b)(1) of the Act, 21 U.S.C.section 360bbb-3(b)(1), unless the authorization is terminated  or revoked sooner.       Influenza A by PCR NEGATIVE NEGATIVE Final   Influenza B by PCR NEGATIVE NEGATIVE Final    Comment: (NOTE) The Xpert Xpress SARS-CoV-2/FLU/RSV plus assay is intended as an aid in the diagnosis of influenza from Nasopharyngeal  swab specimens and should not be used as a sole basis  for treatment. Nasal washings and aspirates are unacceptable for Xpert Xpress SARS-CoV-2/FLU/RSV testing.  Fact Sheet for Patients: EntrepreneurPulse.com.au  Fact Sheet for Healthcare Providers: IncredibleEmployment.be  This test is not yet approved or cleared by the [place] FDA and has been authorized for detection and/or diagnosis of SARS-CoV-2 by FDA under an Emergency Use Authorization (EUA). This EUA will remain in effect (meaning this test can be used) for the duration of the COVID-19 declaration under Section 564(b)(1) of the Act, 21 U.S.C. section 360bbb-3(b)(1), unless the authorization is terminated or revoked.  Performed at [facility], [address], [place], [place] [address]      Labs: BNP (last 3 results) No results for input(s): BNP in the last 8760 hours. Basic Metabolic Panel: Recent Labs  Lab 08/30/20 1037 08/30/20 1049 08/31/20 1024 09/01/20 0339 09/02/20 0451  NA 132* 135 137 135 137  K 3.6 3.7 4.6 4.5 4.2  CL 94* 94* 100 98 101  CO2 29  --  31 32 31  GLUCOSE 341* 348* 207* 178* 161*  BUN $Re'11 10 8 8 12  'PVJ$ CREATININE 0.71 0.60* 0.77 0.77 0.86  CALCIUM 8.9  --  8.9 9.0 8.8*  MG  --   --  2.1 2.1  --   PHOS  --   --  3.2 4.0  --    Liver Function Tests: Recent Labs  Lab 08/30/20 1037 08/31/20 1024 09/01/20 0339 09/02/20 0451  AST 37  --   --  26  ALT 44  --   --  38  ALKPHOS 83  --   --  66  BILITOT 1.1  --   --  0.9  PROT 7.6  --   --  6.5  ALBUMIN 4.0 3.5 3.2* 3.2*   No results for input(s): LIPASE, AMYLASE in the last 168 hours. No results for input(s): AMMONIA in the last 168 hours. CBC: Recent Labs  Lab 08/30/20 1037 08/30/20 1049 08/31/20 1024 09/01/20 0339 09/02/20 0451  WBC 6.5  --  7.6 7.5 8.2  NEUTROABS 4.6  --   --   --   --   HGB 15.1 16.0 15.3 14.2 15.2  HCT 45.1 47.0 46.2 44.5 45.4  MCV 85.9  --  85.9 86.7 84.9  PLT 208   --  219 229 202   Cardiac Enzymes: No results for input(s): CKTOTAL, CKMB, CKMBINDEX, TROPONINI in the last 168 hours. BNP: Invalid input(s): POCBNP CBG: Recent Labs  Lab 09/02/20 1415 09/02/20 1705 09/02/20 2214 09/03/20 0636 09/03/20 1141  GLUCAP 104* 274* 205* 195* 216*   D-Dimer No results for input(s): DDIMER in the last 72 hours. Hgb A1c No results for input(s): HGBA1C in the last 72 hours. Lipid Profile No results for input(s): CHOL, HDL, LDLCALC, TRIG, CHOLHDL, LDLDIRECT in the last 72 hours. Thyroid function studies Recent Labs    09/01/20 0339  TSH 0.787   Anemia work up No results for input(s): VITAMINB12, FOLATE, FERRITIN, TIBC, IRON, RETICCTPCT in the last 72 hours. Urinalysis    Component Value Date/Time   COLORURINE YELLOW 08/30/2020 1109   APPEARANCEUR CLEAR 08/30/2020 1109   LABSPEC 1.023 08/30/2020 1109   PHURINE 6.0 08/30/2020 1109   GLUCOSEU >=500 (A) 08/30/2020 1109   HGBUR NEGATIVE 08/30/2020 1109   BILIRUBINUR NEGATIVE 08/30/2020 1109   KETONESUR 5 (A) 08/30/2020 1109   PROTEINUR NEGATIVE 08/30/2020 1109   NITRITE NEGATIVE 08/30/2020 1109   LEUKOCYTESUR NEGATIVE 08/30/2020 1109   Sepsis Labs Invalid input(s): PROCALCITONIN,  WBC,  [place] [facility] Recent Results (from the past 240 hour(s))  Resp Panel by RT-PCR (Flu A&B, Covid) Nasopharyngeal Swab     Status: None   Collection Time: 08/30/20 11:11 AM   Specimen: Nasopharyngeal Swab; Nasopharyngeal(NP) swabs in vial transport medium  Result Value Ref Range Status   SARS Coronavirus 2 by RT PCR NEGATIVE NEGATIVE Final    Comment: (NOTE) SARS-CoV-2 target nucleic acids are NOT DETECTED.  The SARS-CoV-2 RNA is generally detectable in upper respiratory specimens during the acute phase of infection. The lowest concentration of SARS-CoV-2 viral copies this assay can detect is 138 copies/mL. A negative result does not preclude SARS-Cov-2 infection and should not be used as the sole  basis for treatment or other patient management decisions. A negative result may occur with  improper specimen collection/handling, submission of specimen other than nasopharyngeal swab, presence of viral mutation(s) within the areas targeted by this assay, and inadequate number of viral copies(<138 copies/mL). A negative result must be combined with clinical observations, patient history, and epidemiological information. The expected result is Negative.  Fact Sheet for Patients:  EntrepreneurPulse.com.au  Fact Sheet for Healthcare Providers:  IncredibleEmployment.be  This test is no t yet approved or cleared by the [place] FDA and  has been authorized for detection and/or diagnosis of SARS-CoV-2 by FDA under an Emergency Use Authorization (EUA). This EUA will remain  in effect (meaning this test can be used) for the duration of the COVID-19 declaration under Section 564(b)(1) of the Act, 21 U.S.C.section 360bbb-3(b)(1), unless the authorization is terminated  or revoked sooner.       Influenza A by PCR NEGATIVE NEGATIVE Final   Influenza B by PCR NEGATIVE NEGATIVE Final    Comment: (NOTE) The Xpert Xpress SARS-CoV-2/FLU/RSV plus assay is intended as an aid in the diagnosis of influenza from Nasopharyngeal swab specimens and should not be used as a sole basis for treatment. Nasal washings and aspirates are unacceptable for Xpert Xpress SARS-CoV-2/FLU/RSV testing.  Fact Sheet for Patients: EntrepreneurPulse.com.au  Fact Sheet for Healthcare Providers: IncredibleEmployment.be  This test is not yet approved or cleared by the [place] FDA and has been authorized for detection and/or diagnosis of SARS-CoV-2 by FDA under an Emergency Use Authorization (EUA). This EUA will remain in effect (meaning this test can be used) for the duration of the COVID-19 declaration under Section 564(b)(1) of the Act,  21 U.S.C. section 360bbb-3(b)(1), unless the authorization is terminated or revoked.  Performed at [facility], [address], [place], [place] [address]    Time spent: 30 min  SIGNED:   Marylu Lund, MD  [facility] 09/03/2020, 1:40 PM  If 7PM-7AM, please contact night-coverage

## 2020-09-03 NOTE — Progress Notes (Signed)
STROKE TEAM PROGRESS NOTE   INTERVAL HISTORY His  ex wife  is at the bedside.  Patient still has some double vision but it is improving and he has a patch on his glasses which helps.  He did tolerate the CPAP mask last night and is participating in the sleep smart study and has been randomized to the CPAP mask and seems happy with this decision.[facility]  Speech is improved.  .  Vital signs are stable.  No neurological changes.  Vitals:   09/02/20 2328 09/03/20 0634 09/03/20 0738 09/03/20 1141  BP: (!) 133/96 118/87 125/77 121/77  Pulse: 95 96 (!) 103 90  Resp: 16  18   Temp: 97.8 F (36.6 C) 97.9 F (36.6 C) 98.4 F (36.9 C) 97.6 F (36.4 C)  TempSrc: Oral Oral Oral Oral  SpO2: 97% 97% 97% 96%  Weight:      Height:       CBC:  Recent Labs  Lab 08/30/20 1037 08/30/20 1049 09/01/20 0339 09/02/20 0451  WBC 6.5   < > 7.5 8.2  NEUTROABS 4.6  --   --   --   HGB 15.1   < > 14.2 15.2  HCT 45.1   < > 44.5 45.4  MCV 85.9   < > 86.7 84.9  PLT 208   < > 229 202   < > = values in this interval not displayed.   Basic Metabolic Panel:  Recent Labs  Lab 08/31/20 1024 09/01/20 0339 09/02/20 0451  NA 137 135 137  K 4.6 4.5 4.2  CL 100 98 101  CO2 31 32 31  GLUCOSE 207* 178* 161*  BUN 8 8 12   CREATININE 0.77 0.77 0.86  CALCIUM 8.9 9.0 8.8*  MG 2.1 2.1  --   PHOS 3.2 4.0  --    Lipid Panel:  Recent Labs  Lab 08/31/20 0321  CHOL 143  TRIG 281*  HDL 26*  CHOLHDL 5.5  VLDL 56*  LDLCALC 61   HgbA1c:  Recent Labs  Lab 08/31/20 0321  HGBA1C 11.7*   Urine Drug Screen:  Recent Labs  Lab 08/30/20 1109  LABOPIA NONE DETECTED  COCAINSCRNUR NONE DETECTED  LABBENZ NONE DETECTED  AMPHETMU NONE DETECTED  THCU NONE DETECTED  LABBARB NONE DETECTED    Alcohol Level  Recent Labs  Lab 08/30/20 1037  ETH <10    IMAGING past 24 hours DG Swallowing Func-Speech Pathology  Result Date: 08/31/2020 IMPRESSIONS 08/31/2020 Clinical Impression Pt has a mild oral dysphagia with  relatively intact pharyngeal phase of swallowing. He has incomplete velopharyngeal closure, allowing thin liquids to move anteriorly into the nasopharynx but consistently clearing back down into the pharynx. Pt also reports good sensation and awareness of when this occurs. He has piecemeal swallowing with purees and takes extra time to fully masticate solids. No aspiration occurs across consistencies and there is no significant oral or pharyngeal residue. Recommend continuing with Dys 3 diet and thin liquids. Pt can use a cup or a straw per his preference, but would take smaller volumes at a time to try to reduce nasal regurgitation. ECHOCARDIOGRAM COMPLETE  Result Date: 08/31/2020 ECHOCARDIOGRAM REPORT  IMPRESSIONS   1. No LV thrombus on contrast imaging. Left ventricular ejection fraction, by estimation, is 30 to 35%. The left ventricle has moderately decreased function. The left ventricle demonstrates global hypokinesis. There is moderate concentric left ventricular hypertrophy. Indeterminate diastolic filling due to E-A fusion.   2. Right ventricular systolic function is normal. The right  ventricular size is normal. Tricuspid regurgitation signal is inadequate for assessing PA pressure.   3. Left atrial size was mildly dilated.   4. The mitral valve is grossly normal. Trivial mitral valve regurgitation. No evidence of mitral stenosis.   5. The aortic valve is tricuspid. Aortic valve regurgitation is not visualized. No aortic stenosis is present.   MRA neck Result date: 09/01/20 IMPRESSION: Negative MRA of the neck. No hemodynamically significant stenosis, dissection, or other acute vascular abnormality.  MRI brain:  Result date: 08/30/20 IMPRESSION: 1. 12 mm acute infarct within the anteromedial right frontal lobe subcortical white matter/callosal genu (right ACA vascular territory). 2. Background mild generalized cerebral atrophy and cerebral white matter chronic small vessel disease. 3.  Tiny chronic lacunar infarct within the left thalamus. 4. Mild paranasal sinus disease 5. Left mastoid effusion   MRA head: Result date: 08/30/20 IMPRESSION: 1. No intracranial large vessel occlusion. 2. Intracranial atherosclerotic disease multifocal stenoses, most notably as follows. 3. Mild-to-moderate stenosis within the distal A2 segment of the right anterior cerebral artery. 4. Severe stenosis within the left posterior cerebral artery at the P1/P2 junction. 5. 1-2 mm inferiorly projecting vascular protrusion arising from the supraclinoid left ICA, which may reflect an infundibulum or small aneurysm.   CT Chest Result date 08/30/20 Impression: IMPRESSION: 1. No acute findings in the chest. Specifically, no evidence for pulmonary edema, pleural effusion, or focal airspace consolidation. 2. 4 mm right middle lobe pulmonary nodule. No follow-up needed if patient is low-risk. Non-contrast chest CT can be considered in 12 months if patient is high-risk. This recommendation follows the consensus statement: Guidelines for Management of Incidental Pulmonary Nodules Detected on CT Images: From the Fleischner Society 2017; Radiology 2017; 284:228-243. 3. Hepatic steatosis. 4. Aortic Atherosclerosis (ICD10-I70.0)  CT head Result date: 08/30/2020 IMPRESSION: 1. No acute intracranial findings. 2. Mild chronic microvascular ischemic change.  PHYSICAL EXAM Obese middle-aged Caucasian male not in distress. . Afebrile. Head is nontraumatic. Neck is supple without bruit.    Cardiac exam no murmur or gallop. Lungs are clear to auscultation. Distal pulses are well felt.  Neurological Exam ;  Awake  Alert oriented x 3.  Slightly dysarthric hypophonic speech and language.mild subtle ptosis of the left eye.  Left pupil is smaller than the right.  Sensations are symmetric on the face.  Eye movements full without nystagmus.fundi were not visualized. Vision acuity and fields appear normal. Hearing  is normal. Palatal movements are normal. Face symmetric. Tongue midline. Normal strength, tone, reflexes and coordination. Normal sensation. Gait deferred.  NIH stroke scale 1 for dysarthria premorbid modified Rankin scale 0  ASSESSMENT/PLAN  Tony Combs is a 60 y.o. male with history of DM and HTN who presented with acute onset of slurred speech, diplopia, and blurry vision.  MRI brain revealed a 12 mm acute ischemic infarction within the anteromedial right frontal lobe, as well as a tiny chronic left thalamic infarction. MRA head revealed no LVO.  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  Suspect small brainstem infarct in addition not visualized on MRI .Mar[facility] Kitchenstrokes likely secondary to diffuse atherosclerosis and small vessel disease Code Stroke CT head negative at [facility] MRI brain: acute infarct within the anteromedial right frontal lobe, chronic lacunar infarct within the left thalamus MRA  brain: intracranial atherosclerotic disease multifocal stenoses.  Mild-to-moderate  stenosis within the distal A2 segment of the right anterior cerebral artery. Severe stenosis within the left posterior cerebral artery at the P1/P2 junction. MRA neck: negative 2D Echo: 30 to 35%. The left ventricle has moderately decreased function. The left ventricle demonstrates global hypokinesis. There is moderate concentric LVH LDL 61 HgbA1c 11.7 VTE prophylaxis - Lovenox Diet: Dysphagia 3 No antithrombotic prior to admission, now on aspirin 81 mg daily and clopidogrel 75 mg daily. DAPT for 3 months  then aspirin alone for the treatment of intracranial atherosclerosis Therapy recommendations: No PT follow-up  disposition: Home Hypertension Home meds:  amlodipine and HCTZ Stable Coreg 3.125 bid,  HCTZ 25mg  daily guideline directed medical therapy for reduced EF Long-term BP goal normotensive  Acute systolic heart failure/cardiomyopathy 2D echo: EF of 30 to 35% Cardiology on board Amlodipine stopped on 7/16  Started on carvedilol 3.125 mg twice a day and Entresto 24/26 mg p.o. twice daily. Ischemic evaluation with coronary CTA in 3 to 4 weeks in the outpatient setting and then can be seen in follow-up.  Hyperlipidemia Home meds:  atorvastatin 20mg  daily,  LDL 61, goal < 70 Atorvastatin increased to 80mg  daily Continue statin at discharge  Diabetes type II Uncontrolled Home meds:  glipizide, metformin and Rybelsus HgbA1c 11.7, goal < 7.0 Lantus 10units bid SSI moderate Novolog 5 units TID with meals Consult diabetes coordinator CBGs Recent Labs    09/02/20 2214 09/03/20 0636 09/03/20 1141  GLUCAP 205* 195* 216*    SSI  Other Stroke Risk Factors Obesity, Body mass index is 34.06 kg/m., BMI >/= 30 associated with increased stroke risk, recommend weight loss, diet and exercise as appropriate  Acute systolic heart failure/cardiomyopathy (diagnosed on current admission) 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  Patient did not seek medical attention due to needing to work.   Other Active Problems Incidental finding: 4 mm right middle lobe pulmonary nodule.  PCP to determine if patient needs repeat CT in 12 months  Hospital day # 4    Patient presented with slurred speech, double vision blurred vision and facial weakness and paresthesias likely due to small brainstem infarct which is not visualized on MRI which shows small right frontal periventricular lacunar stroke as well as an old lacunar stroke in the left thalamus.  Etiology is likely combination of small vessel disease and advanced intracranial atherosclerosis.  Continue dual antiplatelet therapy of aspirin and Plavix for 3 months followed  by aspirin alone.  Aggressive risk factor modification.  Patient is participating  in the sleep smart study for sleep apnea and stroke prevention and has been randomized to treatment with CPAP mask .2215  Patient can be discharged home today and follow-up as an outpatient in the stroke clinic as per sleep smart study protocol discussed with patient, ex-wife and Dr. 09/05/20.  Greater than 50% time during this 25-minute visit was spent on counseling and coordination of care about his lacunar stroke and suspected sleep apnea and discussion of stroke prevention and treatment and answering questions  09/05/20, MD Medical Director [facility] Pager: [phone] 09/03/2020 4:45 PM  To contact Stroke Continuity provider, please refer to [facility]. After hours, contact General Neurology

## 2020-09-03 NOTE — Progress Notes (Signed)
Physical Therapy Treatment Patient Details Name: Tony Combs MRN: [id number] DOB: 1960-03-04 Today's Date: 09/03/2020    History of Present Illness 60 y/o male presented to ED on 7/14 with complaints of double vision, blurry vision, and slurred speech which has been flucuating since May. CT head negative. 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 PMH: DM type 2, HTN    PT Comments    Pt reports feeling ready to go home today. Continues to experience double vision with distance gaze, requested OT see before d/c for potential occlusion glasses and exercises. Pt ambulated 500' without AD at mod I level though continues to have difficulty changing gait speeds and making quick corrections. Pt practiced a basic balance program to be done daily at home. Reviewed stroke sxs and action to be taken at home. PT will continue to follow.    Follow Up Recommendations  Outpatient PT;Supervision for mobility/OOB     Equipment Recommendations  None recommended by PT    Recommendations for Other Services       Precautions / Restrictions Precautions Precautions: Fall Precaution Comments: double vision when looking up at a distance Restrictions Weight Bearing Restrictions: No    Mobility  Bed Mobility Overal bed mobility: Modified Independent                  Transfers Overall transfer level: Modified independent Equipment used: None Transfers: Sit to/from Stand Sit to Stand: Modified independent (Device/Increase time)         General transfer comment: pt denied dizziness with standing  Ambulation/Gait Ambulation/Gait assistance: Modified independent (Device/Increase time) Gait Distance (Feet): 500 Feet Assistive device: None Gait Pattern/deviations: Step-through pattern;Decreased stride length;Drifts right/left Gait velocity: decreased Gait velocity interpretation: 1.31 - 2.62 ft/sec, indicative of limited  community ambulator General Gait Details: worked on changing gait speed which pt has minimal ability to do. Continues to report double vision with distance.   Stairs Stairs: Yes Stairs assistance: Modified independent (Device/Increase time) Stair Management: One rail Right;Step to pattern;Forwards Number of Stairs: 5 General stair comments: pt uses step to pattern to compensate for knee pain, discussed sequencing on the descent to manage knee pain better   Wheelchair Mobility    Modified Rankin (Stroke Patients Only) Modified Rankin (Stroke Patients Only) Pre-Morbid Rankin Score: No symptoms Modified Rankin: Moderately severe disability     Balance Overall balance assessment: Mild deficits observed, not formally tested                                          Cognition Arousal/Alertness: Awake/alert Behavior During Therapy: WFL for tasks assessed/performed Overall Cognitive Status: Within Functional Limits for tasks assessed                                 General Comments: able to verbalize deficits      Exercises Other Exercises Other Exercises: SL stance 30 sec each side Other Exercises: tandem stance 30 sec each side    General Comments        Pertinent Vitals/Pain Pain Assessment: No/denies pain    Home Living                      Prior Function  PT Goals (current goals can now be found in the care plan section) Acute Rehab PT Goals Patient Stated Goal: to go home PT Goal Formulation: With patient Time For Goal Achievement: 09/14/20 Potential to Achieve Goals: Good Progress towards PT goals: Progressing toward goals    Frequency    Min 4X/week      PT Plan Current plan remains appropriate    Co-evaluation              AM-PAC PT "6 Clicks" Mobility   Outcome Measure  Help needed turning from your back to your side while in a flat bed without using bedrails?: None Help needed moving  from lying on your back to sitting on the side of a flat bed without using bedrails?: None Help needed moving to and from a bed to a chair (including a wheelchair)?: None Help needed standing up from a chair using your arms (e.g., wheelchair or bedside chair)?: None Help needed to walk in hospital room?: A Little Help needed climbing 3-5 steps with a railing? : A Little 6 Click Score: 22    End of Session Equipment Utilized During Treatment: Gait belt Activity Tolerance: Patient tolerated treatment well Patient left: in bed;with call bell/phone within reach Nurse Communication: Mobility status PT Visit Diagnosis: Unsteadiness on feet (R26.81)     Time: 3785-8850 PT Time Calculation (min) (ACUTE ONLY): 20 min  Charges:  $Gait Training: 8-22 mins                     Lyanne Co, PT  Acute Rehab Services  Pager [phone] Office [phone]    Lawana Chambers Cerys Winget 09/03/2020, 9:45 AM

## 2020-09-03 NOTE — Evaluation (Addendum)
Occupational Therapy Evaluation Patient Details Name: Tony Combs MRN: [id number] DOB: 03/10/1960 Today's Date: 09/03/2020    History of Present Illness 60 y/o male presented to ED on 7/14 with complaints of double vision, blurry vision, and slurred speech which has been flucuating since May. CT head negative. 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 PMH: DM type 2, HTN   Clinical Impression   PTA, pt was living aline and was independent. Pt currently performing ADLs and functional mobility at Mod I level. Pt continues to report diplopia with superior field vision and distance vision. Providing pt with occlusion glasses and tapping L lens. Pt reporting diplopia disappears with occlusion glasses. Providing handout on occlusion glasses management, depth perception, and safety with vision deficits. Recommend pt follow up with eye doctor, refrain from driving, and follow up with OP OT if diplopia does not clear. Will continue to follow acutely as admitted. Providing all education in preparation for possible dc later today.    Follow Up Recommendations  No OT follow up;Supervision - Intermittent    Equipment Recommendations  None recommended by OT    Recommendations for Other Services       Precautions / Restrictions Precautions Precautions: Fall Precaution Comments: double vision with superior and distance vision Restrictions Weight Bearing Restrictions: No      Mobility Bed Mobility Overal bed mobility: Modified Independent                  Transfers Overall transfer level: Modified independent Equipment used: None Transfers: Sit to/from Stand Sit to Stand: Modified independent (Device/Increase time)         General transfer comment: pt denied dizziness with standing    Balance Overall balance assessment: Mild deficits observed, not formally tested                                          ADL either performed or assessed with clinical judgement   ADL Overall ADL's : Modified independent                                       General ADL Comments: Performing ADLs at Mod I level with increased time. Providing education and handout on diplopia including occlusion glasses, depth perction, and safety     Vision Baseline Vision/History: No visual deficits Patient Visual Report: Diplopia Vision Assessment?: Yes Eye Alignment: Impaired (comment) (slight dyconjugate gaze) Ocular Range of Motion: Within Functional Limits Alignment/Gaze Preference: Within Defined Limits Tracking/Visual Pursuits: Other (comment) (Reporting visual fatigue with tracking task) Convergence: Impaired - to be further tested in functional context Diplopia Assessment: Disappears with one eye closed;Present in far gaze;Objects split side to side;Present in primary gaze (Present in superior gaze) Depth Perception: Overshoots (To R) Additional Comments: Reports he is R eye dominant. Pt presenting with diplopia; disappears with closing of either eye. Reporting blurry vision out of L eye when covering R. Providing occlusion glasses and tappping nasap portion of L lens ~50%.     Perception     Praxis      Pertinent Vitals/Pain Pain Assessment: No/denies pain Faces Pain Scale: No hurt     Hand Dominance Right   Extremity/Trunk Assessment Upper Extremity Assessment Upper Extremity Assessment: Overall WFL for tasks assessed  Lower Extremity Assessment Lower Extremity Assessment: Defer to PT evaluation   Cervical / Trunk Assessment Cervical / Trunk Assessment: Normal   Communication Communication Communication: Expressive difficulties   Cognition Arousal/Alertness: Awake/alert Behavior During Therapy: WFL for tasks assessed/performed Overall Cognitive Status: Impaired/Different from baseline Area of Impairment: Problem solving                              Problem Solving: Slow processing General Comments: Very agreeable. Requiring increased time with slower processing.   General Comments  Ex-wife present throughout; very supportive    Exercises Exercises: Other exercises Other Exercises Other Exercises: SL stance 30 sec each side Other Exercises: tandem stance 30 sec each side   Shoulder Instructions      Home Living Family/patient expects to be discharged to:: Private residence Living Arrangements: Alone Available Help at Discharge: Family;Available 24 hours/day Type of Home: House Home Access: Level entry     Home Layout: Two level (basement but does not use)     Bathroom Shower/Tub: Producer, television/film/video: Standard     Home Equipment: None   Additional Comments: ex wife active in life and can provide support  Lives With: Alone    Prior Functioning/Environment Level of Independence: Independent        Comments: working, driving. Reports 4 falls in last 3 months        OT Problem List:        OT Treatment/Interventions: Self-care/ADL training;Therapeutic exercise;Energy conservation;DME and/or AE instruction;Therapeutic activities;Patient/family education    OT Goals(Current goals can be found in the care plan section) Acute Rehab OT Goals Patient Stated Goal: to go home OT Goal Formulation: With patient Time For Goal Achievement: 09/17/20 Potential to Achieve Goals: Good  OT Frequency: Min 2X/week   Barriers to D/C:            Co-evaluation              AM-PAC OT "6 Clicks" Daily Activity     Outcome Measure Help from another person eating meals?: None Help from another person taking care of personal grooming?: None Help from another person toileting, which includes using toliet, bedpan, or urinal?: None Help from another person bathing (including washing, rinsing, drying)?: None Help from another person to put on and taking off regular upper body clothing?: None Help from  another person to put on and taking off regular lower body clothing?: None 6 Click Score: 24   End of Session Nurse Communication: Mobility status  Activity Tolerance: Patient tolerated treatment well Patient left: in chair;with call bell/phone within reach  OT Visit Diagnosis: Unsteadiness on feet (R26.81);Other abnormalities of gait and mobility (R26.89);Muscle weakness (generalized) (M62.81)                Time: 6073-7106 OT Time Calculation (min): 17 min Charges:  OT General Charges $OT Visit: 1 Visit OT Evaluation $OT Eval Low Complexity: 1 Low  Tony Combs MSOT, OTR/L Acute Rehab Pager: [phone] Office: [phone]  Theodoro Grist Anahy Esh 09/03/2020, 10:45 AM

## 2020-09-06 ENCOUNTER — Other Ambulatory Visit: Payer: Self-pay | Admitting: *Deleted

## 2020-09-06 NOTE — Patient Outreach (Signed)
[facility]) Care Management  09/06/2020  Tony Combs Mar 11, 1960 [id number]   Wed 09/05/20 Day # 1  PATIENT STROKE FOLLOW-UP AND MEDICATION Time of Last Call Attempt - 10:00 AM Who reached - Patient  Questions/problems with meds? - Yes  Scheduled a follow-up appointment? - No  Filled new prescriptions? - No  Been able to take every dose of meds? - No     Outreach attempt #1, unsuccessful, HIPAA compliant voice message left.   Plan: RN CM will send outreach letter and follow up within the next 3-4 business days.  Kemper Durie, [place], MSN [facility] Care Management  [facility] Manager [phone]

## 2020-09-11 ENCOUNTER — Other Ambulatory Visit: Payer: Self-pay | Admitting: *Deleted

## 2020-09-11 NOTE — Patient Outreach (Signed)
[facility]) Care Management  09/11/2020  Tony Combs 09-27-1960 [id number]   RED ON EMMI ALERT Wed 09/05/20 Day # 1   PATIENT STROKE FOLLOW-UP AND MEDICATION Time of Last Call Attempt - 10:00 AM Who reached - Patient   Questions/problems with meds? - Yes   Scheduled a follow-up appointment? - No   Filled new prescriptions? - No   Been able to take every dose of meds? - No       Outreach attempt #2, successful.  Identity verified.  This care manager introduced self and stated purpose of call.  [facility] care management services explained.    Member report aside from having some speech issues, he is doing well.  He will begin speech therapy on 8/4 as well as outpatient PT on 8/9.  Wife confirms he has follow up appointments with PCP tomorrow.  Also has office visits scheduled with cardiology on 9/20 and neurology on 10/20.  She will provide transportation.   They both report their biggest concern right now is not being able to get his short acting insulin.  He has all other medications as is taking as instructed.  Wife report they were told his pharmacy, [facility], didn't carry what was ordered.  Wife has told member to increase his Toujeo twice a day from 17 units to 20 units, but blood sugars after meals remain elevated in the 200's.  Otherwise, fasting blood sugars hare been 105-120.  Call placed to [facility], advised that there was confusion with the order itself.  It requested Novolog 10 units daily Penfill, order should have been written for Novolog Pen instead of Penfill.  Pharmacy had reached out to ordering physician, however this was the hospital physician and pharmacy was unsuccessful with contact.  Call placed to PCP office, advised to miscommunication on Insulin order, request correct order to e sent to pharmacy.  Member and wife aware, will follow up directly with pharmacy.  Plan: RN CM will follow up within the next week to confirm Insulin was received.    Kemper Durie, [place], MSN [facility] Care Management  [facility] Manager [phone]

## 2020-09-12 ENCOUNTER — Ambulatory Visit: Payer: Self-pay | Admitting: *Deleted

## 2020-09-17 ENCOUNTER — Other Ambulatory Visit: Payer: Self-pay | Admitting: *Deleted

## 2020-09-17 NOTE — Patient Outreach (Signed)
[facility]) Care Management  09/17/2020  Tony Combs 1961/01/05 [id number]   Outgoing call placed to member to follow up on Insulin administration.  No answer, HIPAA compliant voice message left, will follow up within the next 3-4 business days.  Kemper Durie, [place], MSN [facility] Care Management  [facility] Manager [phone]

## 2020-09-20 ENCOUNTER — Ambulatory Visit (HOSPITAL_COMMUNITY): Payer: BC Managed Care – PPO | Attending: Internal Medicine | Admitting: Speech Pathology

## 2020-09-20 ENCOUNTER — Other Ambulatory Visit: Payer: Self-pay

## 2020-09-20 ENCOUNTER — Encounter (HOSPITAL_COMMUNITY): Payer: Self-pay | Admitting: Speech Pathology

## 2020-09-20 DIAGNOSIS — M6281 Muscle weakness (generalized): Secondary | ICD-10-CM | POA: Insufficient documentation

## 2020-09-20 DIAGNOSIS — R471 Dysarthria and anarthria: Secondary | ICD-10-CM | POA: Insufficient documentation

## 2020-09-20 DIAGNOSIS — Z9181 History of falling: Secondary | ICD-10-CM | POA: Insufficient documentation

## 2020-09-20 NOTE — Therapy (Signed)
[place], [place], [address] Phone: [phone]   Fax:  [phone]  Speech Language Pathology Evaluation  Patient Details  Name: Tony Combs MRN: [id number] Date of Birth: [birth year] Referring Provider (SLP): Tony Lund, MD   Encounter Date: 09/20/2020   End of Session - 09/20/20 1653     Visit Number 1    Number of Visits 9    Authorization Type BCBS BCBS 1/1-12/31/2022 Deduct 4500-met 25.00 due toward bill Co Ins 30% [id number] met visit limit 25-0 used (ST stands alone) NO AUTH   SLP Start Time 0945    SLP Stop Time  1030    SLP Time Calculation (min) 45 min    Activity Tolerance Patient tolerated treatment well             Past Medical History:  Diagnosis Date   Diabetes mellitus without complication ([place])    Hypertension     Past Surgical History:  Procedure Laterality Date   HERNIA REPAIR      There were no vitals filed for this visit.   Subjective Assessment - 09/20/20 1643     Subjective "My speech sounds funny and I have to swallow carefully."    Patient is accompained by: Family member    Special Tests SLUMS ([place] Mental Status Examination)    Currently in Pain? No/denies            60 y/o male presented to ED on 7/14 with complaints of double vision, blurry vision, and slurred speech which has been flucuating since May. CT head negative. 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 PMH: DM type 2, HTN. Pt with left horner syndrome.  Suspect small brainstem infarct in addition not visualized on MRI. He was referred for OP SLP therapy by Dr. Marylu Combs following his acute stay due to residual dysarthria and dysphagia. Pt was accomanied to the evaluation by his ex-wife, Tony Combs.      SLP Evaluation [facility] - 09/20/20 1643       SLP Visit Information   SLP Received On 09/20/20    Referring  Provider (SLP) Tony Lund, MD    Medical Diagnosis CVA      Balance Screen   Has the patient fallen in the past 6 months No    Has the patient had a decrease in activity level because of a fear of falling?  No    Is the patient reluctant to leave their home because of a fear of falling?  No      Prior Functional Status   Cognitive/Linguistic Baseline Within functional limits    Type of Home House     Lives With Significant other    Available Support Family    Education GED    Vocation Full time employment      Pain Assessment   Pain Assessment No/denies pain      Cognition   Overall Cognitive Status Within Functional Limits for tasks assessed    Memory Appears intact    Awareness Appears intact    Problem Solving Appears intact      Auditory Comprehension   Overall Auditory Comprehension Appears within functional limits for tasks assessed    Yes/No Questions Within Functional Limits    Commands Within Functional Limits    Conversation Complex    Interfering Components Processing speed    EffectiveTechniques Extra processing time  Visual Recognition/Discrimination   Discrimination Within Function Limits      Reading Comprehension   Reading Status Within funtional limits      Expression   Primary Mode of Expression Verbal      Verbal Expression   Overall Verbal Expression Appears within functional limits for tasks assessed    Initiation No impairment    Automatic Speech Name;Social Response;Counting    Level of Generative/Spontaneous Verbalization Conversation    Repetition No impairment    Naming No impairment    Pragmatics No impairment    Interfering Components Speech intelligibility    Non-Verbal Means of Communication Not applicable      Written Expression   Dominant Hand Right    Written Expression Not tested      Oral Motor/Sensory Function   Overall Oral Motor/Sensory Function Impaired    Labial ROM Within Functional Limits    Labial Symmetry  Within Functional Limits    Labial Strength Within Functional Limits    Labial Sensation Within Functional Limits    Labial Coordination WFL    Lingual ROM Within Functional Limits    Lingual Symmetry Within Functional Limits    Lingual Strength Reduced    Lingual Coordination WFL    Facial ROM Within Functional Limits    Velum --   rises, but impaired with speech tasks   Mandible Within Functional Limits    Overall Oral Motor/Sensory Function impaired velar movement      Motor Speech   Overall Motor Speech Impaired    Respiration Impaired    Level of Impairment Phrase    Phonation Normal    Resonance Hypernasality    Articulation Impaired    Level of Impairment Word    Intelligibility Intelligibility reduced    Word 75-100% accurate    Phrase 50-74% accurate    Sentence 50-74% accurate    Conversation 50-74% accurate    Motor Planning Witnin functional limits    Motor Speech Errors Aware;Inconsistent    Effective Techniques Slow rate;Pause;Over-articulate    Phonation WFL      Standardized Assessments   Standardized Assessments  --   SLUMS                SLP Short Term Goals - 09/20/20 1654       SLP SHORT TERM GOAL #1   Title Pt will            MBSS from July 2022: <<Pt has a mild oral dysphagia with relatively intact pharyngeal phase of swallowing. He has incomplete velopharyngeal closure, allowing thin liquids to move anteriorly into the nasopharynx but consistently clearing back down into the pharynx. Pt also reports good sensation and awareness of when this occurs. He has piecemeal swallowing with purees and takes extra time to fully masticate solids. No aspiration occurs across consistencies and there is no significant oral or pharyngeal residue. Recommend continuing with Dys 3 diet and thin liquids. 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 Speech is characterized by nasal air emission, hypernasality, imprecise consonants, and breathiness. Pt's speech intelligibility noticeably declined during prolonged oral reading tasks. He initially was judged to be 70% intelligible, but decreased to 40% intelligibility at the end of the paragraph. Specific sound errors include: dg, k,  g, p, b, d, t, and s-blends. Pt has reduced breath support and was only able to sustain /a/ for ~4 seconds. Pt scored a 25/30 on the SLUMS with errors in word recall after a short delay (3/5 words), digits reversed (attention), and 6/8 for paragraph recall showing min deficits in attention and memory. Pt and Tony Combs both feel that Pt performance is typical for him and is at baseline. Pt was seen for MBSS (see above) while acute setting with recommendation for D3/mech soft and thin. He was noted to have nasal regurgitation. He states that he still feels liquids coming back up into nasopharynx and eats soft foods. Pt will benefit from skilled SLP in order to address the above impairments, maximize independence, and decrease burden of care. Pt may benefit from additional neuro work up/follow up if his dysarthria is worse than when he was discharged from the hospital.     Speech Therapy Frequency 2x / week    Duration 4 weeks    Treatment/Interventions SLP instruction and feedback;Oral motor exercises;Compensatory techniques;Patient/family education;Compensatory strategies    Potential to Achieve Goals Good    Potential Considerations Severity of impairments    SLP Home Exercise Plan Pt will completed HEP as assigned to facilitate carryover of treatment strategies and techniques in home environment with use of written cues as needed.    Consulted and Agree with Plan of Care Patient             Patient will benefit from skilled therapeutic intervention in order to  improve the following deficits and impairments:   Dysarthria and anarthria    09/20/20 1654  SLP SHORT TERM GOAL #1  Title Pt will utilize speech intelligibility strategies (over articulation, reduced speaking rate, and vocal intensity) at the word/sentence/phrase level with 90% acc and min cues.  Baseline 75%  Time 4  Period Weeks  Status New  Target Date 11/08/20  SLP SHORT TERM GOAL #2  Title Patient will maintain a maximum phonation duration of 10 seconds or greater with minimal cues during sustained phonation.  Baseline 4 seconds  Time 4  Period Weeks  Status New  Target Date 11/08/20     09/27/20 1534  SLP LONG TERM GOAL #1  Title Pt will increase speech intelligibility to [facility] for short conversations in a quiet environment with use of strategies.  Baseline Moderately impaired  Time 2  Period Months  Status New  Target Date 12/06/20   Problem List Patient Active Problem List   Diagnosis Date Noted   Acute CVA (cerebrovascular accident) ([place]) 08/30/2020   DM (diabetes mellitus) ([place]) 08/30/2020   HTN (hypertension) 08/30/2020   Pulmonary nodule 08/30/2020   Thank you,  Genene Churn, [place]  [place] 09/20/2020, 4:55 PM  [place], [place], [address] Phone: [phone]   Fax:  [phone]  Name: Tony Combs MRN: [id number] Date of Birth: January 01, 1961

## 2020-09-21 ENCOUNTER — Other Ambulatory Visit: Payer: Self-pay | Admitting: *Deleted

## 2020-09-21 LAB — BASIC METABOLIC PANEL
BUN: 10 (ref 4–21)
Creatinine: 0.9 (ref 0.6–1.3)
Glucose: 139
Potassium: 5.6 — AB (ref 3.4–5.3)

## 2020-09-21 NOTE — Patient Outreach (Signed)
[facility]) Care Management  09/21/2020  Tony Combs 10-24-1960 [id number]   Outreach attempt #2, unsuccessful, HIPAA compliant voice message left.  Will send outreach letter and follow up within the next 3-4 business days.  Kemper Durie, [place], MSN [facility] Care Management  [facility] Manager [phone]

## 2020-09-22 LAB — HEMOGLOBIN A1C: Hemoglobin A1C: 10.5

## 2020-09-23 LAB — COMPREHENSIVE METABOLIC PANEL: GFR calc non Af Amer: 95

## 2020-09-25 ENCOUNTER — Encounter (HOSPITAL_COMMUNITY): Payer: Self-pay | Admitting: Physical Therapy

## 2020-09-25 ENCOUNTER — Other Ambulatory Visit: Payer: Self-pay

## 2020-09-25 ENCOUNTER — Ambulatory Visit (HOSPITAL_COMMUNITY): Payer: BC Managed Care – PPO | Admitting: Physical Therapy

## 2020-09-25 DIAGNOSIS — R471 Dysarthria and anarthria: Secondary | ICD-10-CM | POA: Diagnosis not present

## 2020-09-25 DIAGNOSIS — Z9181 History of falling: Secondary | ICD-10-CM

## 2020-09-25 DIAGNOSIS — M6281 Muscle weakness (generalized): Secondary | ICD-10-CM

## 2020-09-25 NOTE — Therapy (Signed)
[facility] [address] [place], [place], [address] Phone: [phone]   Fax:  [phone]  Physical Therapy Evaluation  Patient Details  Name: Tony Combs MRN: [id number] Date of Birth: Oct 18, 1960 Referring Provider (PT): Rickey Barbara   Encounter Date: 09/25/2020   PT End of Session - 09/25/20 0951     Visit Number 1    Number of Visits 8    Date for PT Re-Evaluation 10/25/20    Authorization Type UHC    Authorization - Visit Number 1    Authorization - Number of Visits 20    Progress Note Due on Visit 8    PT Start Time 0830    PT Stop Time 0915    PT Time Calculation (min) 45 min    Activity Tolerance Patient tolerated treatment well    Behavior During Therapy [facility] for tasks assessed/performed             Past Medical History:  Diagnosis Date   Diabetes mellitus without complication (HCC)    Hypertension     Past Surgical History:  Procedure Laterality Date   HERNIA REPAIR      There were no vitals filed for this visit.    Subjective Assessment - 09/25/20 0825     Subjective Pt admitted into the hospital on 7/14 with an acute Rt CVA; discharged  on 7/18.  Pt is now being referred to skilled PT.  He states that most of his problems seems to be in his vision and in his speech.  He is currently using a cane which he was not previously using, going up and down steps is more difficult.    Pertinent History DM, HTN,    How long can you stand comfortably? 30 minutes    How long can you walk comfortably? using a cane able to walk for maybe    Patient Stated Goals Be off the cane, walk faster, be able to go up and down steps in a reciprocal manner.    Currently in Pain? No/denies                [facility] PT Assessment - 09/25/20 0001       Assessment   Medical Diagnosis Rt CVA with difficulty walking    Referring Provider (PT) Rickey Barbara    Onset Date/Surgical Date 08/30/20    Next MD Visit not scheduled     Prior Therapy acute/HH      Precautions   Precautions Fall   due to periodic  dizziness     Restrictions   Weight Bearing Restrictions No      Balance Screen   Has the patient fallen in the past 6 months Yes    How many times? 4    Has the patient had a decrease in activity level because of a fear of falling?  Yes    Is the patient reluctant to leave their home because of a fear of falling?  No      Home Environment   Living Environment Private residence    Type of Home House    Home Access Stairs to enter    Entrance Stairs-Number of Steps 10      Prior Function   Level of Independence Independent    Vocation Full time employment    Vocation Requirements standing and walking for 8 hr    Leisure gardening      Cognition   Overall Cognitive Status Within Functional Limits  for tasks assessed      Observation/Other Assessments   Focus on Therapeutic Outcomes (FOTO)  62; 38% affected      Functional Tests   Functional tests Sit to Stand;Single leg stance      Single Leg Stance   Comments Rt: 31"; Lt 29"      Sit to Stand   Comments 8 in a 30 second period of time.      ROM / Strength   AROM / PROM / Strength Strength      Strength   Strength Assessment Site Hip;Knee;Ankle    Right/Left Hip Right;Left    Right Hip Flexion 4/5    Right Hip Extension 4-/5    Right Hip ABduction 4+/5    Left Hip Flexion 5/5    Left Hip Extension 4/5    Left Hip ABduction 5/5    Right/Left Knee Right;Left    Right Knee Flexion 4/5    Right Knee Extension 5/5    Left Knee Flexion 5/5    Left Knee Extension 5/5    Right/Left Ankle Right;Left    Right Ankle Dorsiflexion 4-/5    Left Ankle Dorsiflexion 4/5      Ambulation/Gait   Ambulation Distance (Feet) 266 Feet    Gait Comments 2 min                        Objective measurements completed on examination: See above findings.       [facility] Adult PT Treatment/Exercise - 09/25/20 0001       Exercises    Exercises Knee/Hip      Knee/Hip Exercises: Seated   Sit to Sand 10 reps      Knee/Hip Exercises: Sidelying   Hip ABduction Right;10 reps      Knee/Hip Exercises: Prone   Hamstring Curl 10 reps    Hip Extension Both;10 reps                    PT Education - 09/25/20 0950     Education Details HEP    Person(s) Educated Patient    Methods Explanation    Comprehension Returned demonstration              PT Short Term Goals - 09/25/20 0954       PT SHORT TERM GOAL #1   Title Pt to be I in HEP to be able to ambulate for 30 minutes without resting    Time 2    Period Weeks    Status New    Target Date 10/09/20      PT SHORT TERM GOAL #2   Title PT LE strength increased as to be able to complete 12 sit to stand in 30 seconds    Time 2    Period Weeks    Status New               PT Long Term Goals - 09/25/20 3810       PT LONG TERM GOAL #1   Title PT to be completing an advance HEP in order to be walking without his cane.    Time 4    Period Weeks    Status New    Target Date 10/24/20      PT LONG TERM GOAL #2   Title PT LE strength to improve to be able to go up and down 12 steps in a reciprocal manner without the use of UE assist.  Time 4    Period Weeks    Status New      PT LONG TERM GOAL #3   Title PT balance to be improved to be able to single leg stance on both LE for at least 40" to reduce risk of falling.                    Plan - 09/25/20 1059     Clinical Impression Statement Mr. Pineo is a 60 yo male who had an acute Rt frontal CVAon 7/14.  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  The pt was discharged from acute IP hospital stay on 7/18 and is now being referred to skilled PT. Mr. Madlock was totally I and working full time, his is currently ambulating with a cane I.     Mr. Luhman is experiencing decreased strength, decreased balance, decreased activity tolerance and will benefit from skilled PT to higher  level core and LE strengthening as well as balance activity to return him to his previous functioning level.  He is still having periodic dizziness from the stroke.    Examination-Activity Limitations Locomotion Level;Carry;Stairs    Examination-Participation Restrictions Community Activity;Occupation;Yard Work    Stability/Clinical Decision Making Stable/Uncomplicated    Optometrist Low    Rehab Potential Good    PT Frequency 2x / week    PT Duration 4 weeks    PT Treatment/Interventions Patient/family education;Therapeutic activities;Stair training;Gait training;Functional mobility training;Balance training;Therapeutic exercise    PT Next Visit Plan begin vector stances, tandem stance with head turns, lunges, step ups, steps  and leg press continue to progress balance as well as core and LE strength.    PT Home Exercise Plan Evaluation:  sit to stand, prone knee flex, prone hip extension    Recommended Other Services Ask pt if he is having any difficulty with UE as he does not have nor has he had an OT referral.             Patient will benefit from skilled therapeutic intervention in order to improve the following deficits and impairments:  Decreased activity tolerance, Decreased balance, Decreased strength  Visit Diagnosis: Muscle weakness (generalized)  History of falling     Problem List Patient Active Problem List   Diagnosis Date Noted   Acute CVA (cerebrovascular accident) (HCC) 08/30/2020   DM (diabetes mellitus) (HCC) 08/30/2020   HTN (hypertension) 08/30/2020   Pulmonary nodule 08/30/2020  Virgina Organ, PT CLT [phone]  09/25/2020, 11:09 AM  [facility] [address] [place], [place], [address] Phone: [phone]   Fax:  [phone]  Name: BENICIO MANNA MRN: [id number] Date of Birth: 1960-06-01

## 2020-09-26 ENCOUNTER — Other Ambulatory Visit: Payer: Self-pay | Admitting: *Deleted

## 2020-09-26 NOTE — Patient Outreach (Signed)
[facility]) Care Management  09/26/2020  ALEXSANDER CAVINS 08-06-1960 [id number]   Outreach attempt #3, unsuccessful, HIPAA compliant voice message left.  Will make 4th and final attempt within the next 3 weeks.  If remain unsuccessful, will close case due to inability to maintain contact.  Kemper Durie, [place], MSN [facility] Care Management  [facility] Manager [phone]

## 2020-10-02 ENCOUNTER — Encounter (HOSPITAL_COMMUNITY): Payer: Self-pay | Admitting: Speech Pathology

## 2020-10-02 ENCOUNTER — Ambulatory Visit (HOSPITAL_COMMUNITY): Payer: BC Managed Care – PPO | Admitting: Speech Pathology

## 2020-10-02 ENCOUNTER — Other Ambulatory Visit: Payer: Self-pay

## 2020-10-02 ENCOUNTER — Encounter (HOSPITAL_COMMUNITY): Payer: BC Managed Care – PPO | Admitting: Speech Pathology

## 2020-10-02 DIAGNOSIS — R471 Dysarthria and anarthria: Secondary | ICD-10-CM | POA: Diagnosis not present

## 2020-10-02 NOTE — Therapy (Signed)
[place], [place], [address] Phone: [phone]   Fax:  [phone]  Speech Language Pathology Treatment  Patient Details  Name: Tony Combs MRN: [id number] Date of Birth: 04-14-1960 Referring Provider (SLP): Marylu Lund, MD   Encounter Date: 10/02/2020   End of Session - 10/02/20 1722     Visit Number 2    Number of Visits 9    Authorization Type BCBS   BCBS 1/1-12/31/2022  Deduct 4500-met 25.00 due toward bill  Co Ins 30%  [phone] met  visit limit 25-0 used (ST stands alone) NO AUTH   SLP Start Time 5631    SLP Stop Time  1600    SLP Time Calculation (min) 45 min    Activity Tolerance Patient tolerated treatment well             Past Medical History:  Diagnosis Date   Diabetes mellitus without complication ([place])    Hypertension     Past Surgical History:  Procedure Laterality Date   HERNIA REPAIR      There were no vitals filed for this visit.   Subjective Assessment - 10/02/20 1718     Subjective "I am doing better."    Patient is accompained by: Family member    Currently in Pain? No/denies                   ADULT SLP TREATMENT - 10/02/20 1718       General Information   Behavior/Cognition Alert;Cooperative;Pleasant mood    Patient Positioning Upright in chair    Oral care provided N/A    HPI 60 y/o male presented to ED on 7/14 with complaints of double vision, blurry vision, and slurred speech which has been flucuating since May. CT head negative. 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 PMH: DM type 2, HTN. Pt with left horner syndrome.  Suspect small brainstem infarct in addition not visualized on MRI. He was referred for OP SLP therapy by Dr. Marylu Lund following his acute stay due to residual dysarthria and dysphagia. Pt was accomanied to the evaluation by his ex-wife, [name].      Treatment  Provided   Treatment provided Cognitive-Linquistic      Pain Assessment   Pain Assessment No/denies pain      Cognitive-Linquistic Treatment   Treatment focused on Dysarthria;Patient/family/caregiver education    Skilled Treatment SLP provided demonstration and feedback for breath support, vowel phonation, straw breathing, and speech intelligibility strategies.      Assessment / Recommendations / Plan   Plan Continue with current plan of care      Progression Toward Goals   Progression toward goals Progressing toward goals              SLP Education - 10/02/20 1722     Education Details Provided written information for breath support and speech intelligibility strategies    Person(s) Educated Patient;Spouse    Methods Explanation;Handout;Demonstration    Comprehension Verbalized understanding              SLP Short Term Goals - 10/02/20 1737       SLP SHORT TERM GOAL #1   Title Pt will utilize speech intelligibility strategies (over articulation, reduced speaking rate, and vocal intensity) at the word/sentence/phrase level with 90% acc and min cues.    Baseline 75%    Time 4    Period Weeks  Status On-going    Target Date 11/08/20      SLP SHORT TERM GOAL #2   Title Patient will maintain a maximum phonation duration of 10 seconds or greater with minimal cues during sustained phonation.    Baseline 4 seconds    Time 4    Period Weeks    Status On-going    Target Date 11/08/20              SLP Long Term Goals - 10/02/20 1737       SLP LONG TERM GOAL #1   Title Pt will increase speech intelligibility to WFL for short conversations in a quiet environment with use of strategies.    Baseline Moderately impaired    Time 2    Period Months    Status On-going              Plan - 10/02/20 1726     Clinical Impression Statement Pt with definite improvement in velar function and speech intelligibility this date. He was able to sustain /a/ for 9  seconds today (3-4 seconds last week) over three consecutive trials. He continues to struggle to produce sustained /s/ and /z/. SLP provided straw in a cup of water to straw phonation and breathing/exhalation into the water. He was eventually able to add voicing, but was limited. He orally read the same passage as the evaluation and it was recorded for feedback. He was judged to be 100% intelligible initially and speech (velar function) began to deteriorate after ~4 sentences. He was cued to stop and take a break for ~30 seconds, which improved speech, but not back to baseline. Pt is using pacing strategies and over articulating with min cues. He canceled his appointments for some reason and now would like to reschedule and get in sooner. He was given exercises to practice at home. Continue to target speech goals.    Speech Therapy Frequency 2x / week    Duration 4 weeks    Treatment/Interventions SLP instruction and feedback;Oral motor exercises;Compensatory techniques;Patient/family education;Compensatory strategies    Potential to Achieve Goals Good    Potential Considerations Severity of impairments    SLP Home Exercise Plan Pt will completed HEP as assigned to facilitate carryover of treatment strategies and techniques in home environment with use of written cues as needed.    Consulted and Agree with Plan of Care Patient             Patient will benefit from skilled therapeutic intervention in order to improve the following deficits and impairments:   Dysarthria and anarthria    Problem List Patient Active Problem List   Diagnosis Date Noted   Acute CVA (cerebrovascular accident) (HCC) 08/30/2020   DM (diabetes mellitus) (HCC) 08/30/2020   HTN (hypertension) 08/30/2020   Pulmonary nodule 08/30/2020   Thank you,  Dabney Porter, CCC-SLP [phone]  PORTER,DABNEY 10/02/2020, 5:38 PM  [facility] [address] [place], [place],  [address] Phone: [phone]   Fax:  [phone]   Name: Kaysan W Hardt MRN: [id number] Date of Birth: 04/28/1960  

## 2020-10-03 ENCOUNTER — Encounter (HOSPITAL_COMMUNITY): Payer: Self-pay | Admitting: Physical Therapy

## 2020-10-03 NOTE — Therapy (Signed)
[place] [address] [place], [place], [address] Phone: [phone]   Fax:  [phone]  Physical Therapy Treatment  Patient Details  Name: Tony Combs MRN: [id number] Date of Birth: [birth year] Referring Provider (PT): Marylu Lund   Encounter Date: 10/03/2020  PHYSICAL THERAPY DISCHARGE SUMMARY  Visits from Start of Care: 1  Current functional level related to goals / functional outcomes: See eval   Remaining deficits: See eval   Education / Equipment: HEP   Patient agrees to discharge. Patient goals were not met. Patient is being discharged due to the patient's request.   Past Medical History:  Diagnosis Date   Diabetes mellitus without complication ([place])    Hypertension     Past Surgical History:  Procedure Laterality Date   HERNIA REPAIR      There were no vitals filed for this visit.     Rayetta Humphrey, PT CLT [phone]                            PT Short Term Goals - 09/25/20 0954       PT SHORT TERM GOAL #1   Title Pt to be I in HEP to be able to ambulate for 30 minutes without resting    Time 2    Period Weeks    Status New    Target Date 10/09/20      PT SHORT TERM GOAL #2   Title PT LE strength increased as to be able to complete 12 sit to stand in 30 seconds    Time 2    Period Weeks    Status New               PT Long Term Goals - 09/25/20 7902       PT LONG TERM GOAL #1   Title PT to be completing an advance HEP in order to be walking without his cane.    Time 4    Period Weeks    Status New    Target Date 10/24/20      PT LONG TERM GOAL #2   Title PT LE strength to improve to be able to go up and down 12 steps in a reciprocal manner without the use of UE assist.    Time 4    Period Weeks    Status New      PT LONG TERM GOAL #3   Title PT balance to be improved to be able to single leg stance on both LE for at least 40" to reduce risk of  falling.                    Patient will benefit from skilled therapeutic intervention in order to improve the following deficits and impairments:     Visit Diagnosis: No diagnosis found.     Problem List Patient Active Problem List   Diagnosis Date Noted   Acute CVA (cerebrovascular accident) ([place]) 08/30/2020   DM (diabetes mellitus) ([place]) 08/30/2020   HTN (hypertension) 08/30/2020   Pulmonary nodule 08/30/2020    Meshia Rau,CINDY 10/03/2020, 8:11 AM  [place], [place], [address] Phone: [phone]   Fax:  [phone]  Name: Tony Combs MRN: [id number] Date of Birth: [birth year]

## 2020-10-04 ENCOUNTER — Encounter (HOSPITAL_COMMUNITY): Payer: BC Managed Care – PPO | Admitting: Speech Pathology

## 2020-10-09 ENCOUNTER — Encounter (HOSPITAL_COMMUNITY): Payer: BC Managed Care – PPO | Admitting: Speech Pathology

## 2020-10-11 ENCOUNTER — Encounter (HOSPITAL_COMMUNITY): Payer: BC Managed Care – PPO | Admitting: Speech Pathology

## 2020-10-15 ENCOUNTER — Ambulatory Visit (HOSPITAL_COMMUNITY): Payer: BC Managed Care – PPO

## 2020-10-15 ENCOUNTER — Encounter (HOSPITAL_COMMUNITY): Payer: BC Managed Care – PPO | Admitting: Speech Pathology

## 2020-10-17 ENCOUNTER — Other Ambulatory Visit: Payer: Self-pay

## 2020-10-17 ENCOUNTER — Encounter (HOSPITAL_COMMUNITY): Payer: Self-pay | Admitting: Speech Pathology

## 2020-10-17 ENCOUNTER — Ambulatory Visit (HOSPITAL_COMMUNITY): Payer: BC Managed Care – PPO | Admitting: Physical Therapy

## 2020-10-17 ENCOUNTER — Other Ambulatory Visit: Payer: Self-pay | Admitting: *Deleted

## 2020-10-17 ENCOUNTER — Encounter (HOSPITAL_COMMUNITY): Payer: BC Managed Care – PPO | Admitting: Speech Pathology

## 2020-10-17 ENCOUNTER — Ambulatory Visit (HOSPITAL_COMMUNITY): Payer: BC Managed Care – PPO | Admitting: Speech Pathology

## 2020-10-17 DIAGNOSIS — R471 Dysarthria and anarthria: Secondary | ICD-10-CM | POA: Diagnosis not present

## 2020-10-17 NOTE — Therapy (Signed)
[place], [place], [address] Phone: [phone]   Fax:  [phone]  Speech Language Pathology Treatment  Patient Details  Name: BURDETT PINZON MRN: [id number] Date of Birth: 06/07/1960 Referring Provider (SLP): Marylu Lund, MD   Encounter Date: 10/17/2020   End of Session - 10/17/20 1137     Visit Number 3    Number of Visits 9    Authorization Type BCBS   BCBS 1/1-12/31/2022  Deduct 4500-met 25.00 due toward bill  Co Ins 30%  [phone] met  visit limit 25-0 used (ST stands alone) NO AUTH   SLP Start Time 1030    SLP Stop Time  1115    SLP Time Calculation (min) 45 min    Activity Tolerance Patient tolerated treatment well             Past Medical History:  Diagnosis Date   Diabetes mellitus without complication ([place])    Hypertension     Past Surgical History:  Procedure Laterality Date   HERNIA REPAIR      There were no vitals filed for this visit.   Subjective Assessment - 10/17/20 1133     Subjective "I think today will be my last session."    Patient is accompained by: Family member    Currently in Pain? No/denies              ADULT SLP TREATMENT - 10/17/20 1133       General Information   Behavior/Cognition Alert;Cooperative;Pleasant mood    Patient Positioning Upright in chair    Oral care provided N/A    HPI 60 y/o male presented to ED on 7/14 with complaints of double vision, blurry vision, and slurred speech which has been flucuating since May. CT head negative. 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 PMH: DM type 2, HTN. Pt with left horner syndrome.  Suspect small brainstem infarct in addition not visualized on MRI. He was referred for OP SLP therapy by Dr. Marylu Lund following his acute stay due to residual dysarthria and dysphagia. Pt was accomanied to the evaluation by his ex-wife, [name].       Treatment Provided   Treatment provided Cognitive-Linquistic      Pain Assessment   Pain Assessment No/denies pain      Cognitive-Linquistic Treatment   Treatment focused on Dysarthria;Patient/family/caregiver education    Skilled Treatment SLP provided demonstration and feedback for breath support, vowel phonation, and speech intelligibility strategies. Pt participated in oral reading tasks and picture description tasks to implement strategies.      Assessment / Recommendations / Plan   Plan Discharge SLP treatment due to (comment)   Pt wishes to be discharged from therapy after today     Progression Toward Goals   Progression toward goals Goals met, education completed, patient discharged from [place] Education - 10/17/20 1135     Education Details Provided additional HEP via sentences and multisyllabic words to practice going forward    Person(s) Educated Patient;Spouse    Methods Explanation;Handout    Comprehension Verbalized understanding              SLP Short Term Goals - 10/17/20 1138       SLP SHORT TERM GOAL #1   Title Pt will utilize speech intelligibility strategies (over articulation, reduced speaking rate, and  vocal intensity) at the word/sentence/phrase level with 90% acc and min cues.    Baseline 75%    Time 4    Period Weeks    Status Achieved    Target Date 11/08/20      SLP SHORT TERM GOAL #2   Title Patient will maintain a maximum phonation duration of 10 seconds or greater with minimal cues during sustained phonation.    Baseline 4 seconds    Time 4    Period Weeks    Status Partially Met   8 seconds, improvement from 4 seconds   Target Date 11/08/20              SLP Long Term Goals - 10/17/20 1140       SLP LONG TERM GOAL #1   Title Pt will increase speech intelligibility to [facility] for short conversations in a quiet environment with use of strategies.    Baseline Moderately impaired    Time 2    Period Months     Status Achieved              Plan - 10/17/20 1137     Clinical Impression Statement Pt with continued improvement in velar function and speech intelligibility this date. He was able to sustain /a/ for 8-9 seconds today (3-4 seconds at evaluation) over three consecutive trials. Pt read sentences aloud and  (velar function) began to deteriorate after ~5 sentences. He was cued to stop and take a break, swallow, and replenish his breath. Pt with notable improvement after strategies implemented. He reported disliking speech exercises (sustained "ah") and that he is generally a quiet person. He reports feeling satisfied with his speech, but knows it is not his baseline. SLP encouraged Pt to focus on replenishing breath frequently, as this helps compensate for velar dysfunction. Pt wished to be discharged from therapy at this time. He was given exercises to practice at home and was encouraged to contact SLP if further questions or concerns arise. Pt/caregiver in agreement with plan for discharge to home program.    Treatment/Interventions SLP instruction and feedback;Oral motor exercises;Compensatory techniques;Patient/family education;Compensatory strategies    Potential to Achieve Goals Good    Potential Considerations Severity of impairments    SLP Home Exercise Plan Pt will completed HEP as assigned to facilitate carryover of treatment strategies and techniques in home environment with use of written cues as needed.    Consulted and Agree with Plan of Care Patient             Patient will benefit from skilled therapeutic intervention in order to improve the following deficits and impairments:   Dysarthria and anarthria    Problem List Patient Active Problem List   Diagnosis Date Noted   Acute CVA (cerebrovascular accident) ([place]) 08/30/2020   DM (diabetes mellitus) ([place]) 08/30/2020   HTN (hypertension) 08/30/2020   Pulmonary nodule 08/30/2020   SPEECH THERAPY DISCHARGE  SUMMARY  Visits from Start of Care: 3  Current functional level related to goals / functional outcomes: Partially met, see above   Remaining deficits: 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 Patient goals were partially met. Patient is being discharged due to being pleased with the current functional level..   Thank you,  Genene Churn, [place]  [facility] 10/17/2020, 11:40 AM  [place], [place], [address] Phone: [phone]   Fax:  [phone]   Name: ARISTIDIS TALERICO MRN: [id number] Date of Birth: [birth year]

## 2020-10-17 NOTE — Patient Outreach (Signed)
[facility]) Care Management  10/17/2020  ERIKA HUSSAR 05-12-1960 [id number]   Outreach attempt #4, unsuccessful, HIPAA compliant voice message left.  No response from member after multiple unsuccessful outreach attempts and letter sent.  Will close case at this time due to inability to maintain contact.  Will notify member and primary MD of case closure.  Kemper Durie, [place], MSN [facility] Care Management  [facility] Manager [phone]

## 2020-10-23 ENCOUNTER — Encounter (HOSPITAL_COMMUNITY): Payer: BC Managed Care – PPO | Admitting: Speech Pathology

## 2020-10-23 ENCOUNTER — Ambulatory Visit (HOSPITAL_COMMUNITY): Payer: BC Managed Care – PPO | Admitting: Physical Therapy

## 2020-10-25 ENCOUNTER — Ambulatory Visit (HOSPITAL_COMMUNITY): Payer: BC Managed Care – PPO | Admitting: Physical Therapy

## 2020-10-25 ENCOUNTER — Encounter (HOSPITAL_COMMUNITY): Payer: BC Managed Care – PPO | Admitting: Speech Pathology

## 2020-10-29 ENCOUNTER — Ambulatory Visit (HOSPITAL_COMMUNITY): Payer: BC Managed Care – PPO | Admitting: Physical Therapy

## 2020-10-29 ENCOUNTER — Encounter (HOSPITAL_COMMUNITY): Payer: BC Managed Care – PPO | Admitting: Speech Pathology

## 2020-10-31 ENCOUNTER — Encounter (HOSPITAL_COMMUNITY): Payer: BC Managed Care – PPO | Admitting: Speech Pathology

## 2020-10-31 ENCOUNTER — Ambulatory Visit (HOSPITAL_COMMUNITY): Payer: BC Managed Care – PPO | Admitting: Physical Therapy

## 2020-11-01 ENCOUNTER — Encounter: Payer: BC Managed Care – PPO | Admitting: Nutrition

## 2020-11-01 ENCOUNTER — Encounter: Payer: Self-pay | Admitting: Nurse Practitioner

## 2020-11-01 ENCOUNTER — Ambulatory Visit (INDEPENDENT_AMBULATORY_CARE_PROVIDER_SITE_OTHER): Payer: BC Managed Care – PPO | Admitting: Nurse Practitioner

## 2020-11-01 ENCOUNTER — Other Ambulatory Visit: Payer: Self-pay

## 2020-11-01 VITALS — BP 112/68 | HR 96 | Ht 68.0 in | Wt 220.0 lb

## 2020-11-01 DIAGNOSIS — E1165 Type 2 diabetes mellitus with hyperglycemia: Secondary | ICD-10-CM

## 2020-11-01 DIAGNOSIS — I10 Essential (primary) hypertension: Secondary | ICD-10-CM

## 2020-11-01 DIAGNOSIS — Z794 Long term (current) use of insulin: Secondary | ICD-10-CM | POA: Diagnosis not present

## 2020-11-01 DIAGNOSIS — E782 Mixed hyperlipidemia: Secondary | ICD-10-CM

## 2020-11-01 MED ORDER — INSULIN GLARGINE (1 UNIT DIAL) 300 UNIT/ML ~~LOC~~ SOPN: 25.0000 [IU] | PEN_INJECTOR | Freq: Every day | SUBCUTANEOUS | 0 refills | Status: DC

## 2020-11-01 MED ORDER — INSULIN ASPART PENFILL 100 UNIT/ML ~~LOC~~ SOCT: 4.0000 [IU] | Freq: Three times a day (TID) | SUBCUTANEOUS | 0 refills | Status: DC

## 2020-11-01 NOTE — Patient Instructions (Signed)
Diabetes Mellitus and Nutrition, Adult When you have diabetes, or diabetes mellitus, it is very important to have healthy eating habits because your blood sugar (glucose) levels are greatly affected by what you eat and drink. Eating healthy foods in the right amounts, at about the same times every day, can help you:  Control your blood glucose.  Lower your risk of heart disease.  Improve your blood pressure.  Reach or maintain a healthy weight. What can affect my meal plan? Every person with diabetes is different, and each person has different needs for a meal plan. Your health care provider may recommend that you work with a dietitian to make a meal plan that is best for you. Your meal plan may vary depending on factors such as:  The calories you need.  The medicines you take.  Your weight.  Your blood glucose, blood pressure, and cholesterol levels.  Your activity level.  Other health conditions you have, such as heart or kidney disease. How do carbohydrates affect me? Carbohydrates, also called carbs, affect your blood glucose level more than any other type of food. Eating carbs naturally raises the amount of glucose in your blood. Carb counting is a method for keeping track of how many carbs you eat. Counting carbs is important to keep your blood glucose at a healthy level, especially if you use insulin or take certain oral diabetes medicines. It is important to know how many carbs you can safely have in each meal. This is different for every person. Your dietitian can help you calculate how many carbs you should have at each meal and for each snack. How does alcohol affect me? Alcohol can cause a sudden decrease in blood glucose (hypoglycemia), especially if you use insulin or take certain oral diabetes medicines. Hypoglycemia can be a life-threatening condition. Symptoms of hypoglycemia, such as sleepiness, dizziness, and confusion, are similar to symptoms of having too much  alcohol.  Do not drink alcohol if: ? Your health care provider tells you not to drink. ? You are pregnant, may be pregnant, or are planning to become pregnant.  If you drink alcohol: ? Do not drink on an empty stomach. ? Limit how much you use to:  0-1 drink a day for women.  0-2 drinks a day for men. ? Be aware of how much alcohol is in your drink. In the [place] one drink equals one 12 oz bottle of beer (355 mL), one 5 oz glass of wine (148 mL), or one 1 oz glass of hard liquor (44 mL). ? Keep yourself hydrated with water, diet soda, or unsweetened iced tea.  Keep in mind that regular soda, juice, and other mixers may contain a lot of sugar and must be counted as carbs. What are tips for following this plan? Reading food labels  Start by checking the serving size on the "Nutrition Facts" label of packaged foods and drinks. The amount of calories, carbs, fats, and other nutrients listed on the label is based on one serving of the item. Many items contain more than one serving per package.  Check the total grams (g) of carbs in one serving. You can calculate the number of servings of carbs in one serving by dividing the total carbs by 15. For example, if a food has 30 g of total carbs per serving, it would be equal to 2 servings of carbs.  Check the number of grams (g) of saturated fats and trans fats in one serving. Choose foods that have   a low amount or none of these fats.  Check the number of milligrams (mg) of salt (sodium) in one serving. Most people should limit total sodium intake to less than 2,300 mg per day.  Always check the nutrition information of foods labeled as "low-fat" or "nonfat." These foods may be higher in added sugar or refined carbs and should be avoided.  Talk to your dietitian to identify your daily goals for nutrients listed on the label. Shopping  Avoid buying canned, pre-made, or processed foods. These foods tend to be high in fat, sodium, and added  sugar.  Shop around the outside edge of the grocery store. This is where you will most often find fresh fruits and vegetables, bulk grains, fresh meats, and fresh dairy. Cooking  Use low-heat cooking methods, such as baking, instead of high-heat cooking methods like deep frying.  Cook using healthy oils, such as olive, canola, or sunflower oil.  Avoid cooking with butter, cream, or high-fat meats. Meal planning  Eat meals and snacks regularly, preferably at the same times every day. Avoid going long periods of time without eating.  Eat foods that are high in fiber, such as fresh fruits, vegetables, beans, and whole grains. Talk with your dietitian about how many servings of carbs you can eat at each meal.  Eat 4-6 oz (112-168 g) of lean protein each day, such as lean meat, chicken, fish, eggs, or tofu. One ounce (oz) of lean protein is equal to: ? 1 oz (28 g) of meat, chicken, or fish. ? 1 egg. ?  cup (62 g) of tofu.  Eat some foods each day that contain healthy fats, such as avocado, nuts, seeds, and fish.   What foods should I eat? Fruits Berries. Apples. Oranges. Peaches. Apricots. Plums. Grapes. Mango. Papaya. Pomegranate. Kiwi. Cherries. Vegetables Lettuce. Spinach. Leafy greens, including kale, chard, collard greens, and mustard greens. Beets. Cauliflower. Cabbage. Broccoli. Carrots. Green beans. Tomatoes. Peppers. Onions. Cucumbers. Brussels sprouts. Grains Whole grains, such as whole-wheat or whole-grain bread, crackers, tortillas, cereal, and pasta. Unsweetened oatmeal. Quinoa. Brown or wild rice. Meats and other proteins Seafood. Poultry without skin. Lean cuts of poultry and beef. Tofu. Nuts. Seeds. Dairy Low-fat or fat-free dairy products such as milk, yogurt, and cheese. The items listed above may not be a complete list of foods and beverages you can eat. Contact a dietitian for more information. What foods should I avoid? Fruits Fruits canned with  syrup. Vegetables Canned vegetables. Frozen vegetables with butter or cream sauce. Grains Refined white flour and flour products such as bread, pasta, snack foods, and cereals. Avoid all processed foods. Meats and other proteins Fatty cuts of meat. Poultry with skin. Breaded or fried meats. Processed meat. Avoid saturated fats. Dairy Full-fat yogurt, cheese, or milk. Beverages Sweetened drinks, such as soda or iced tea. The items listed above may not be a complete list of foods and beverages you should avoid. Contact a dietitian for more information. Questions to ask a health care provider  Do I need to meet with a diabetes educator?  Do I need to meet with a dietitian?  What number can I call if I have questions?  When are the best times to check my blood glucose? Where to find more information:  American Diabetes Association: diabetes.org  Academy of Nutrition and Dietetics: www.eatright.org  National Institute of Diabetes and Digestive and Kidney Diseases: www.niddk.nih.gov  Association of Diabetes Care and Education Specialists: www.diabeteseducator.org Summary  It is important to have healthy eating   habits because your blood sugar (glucose) levels are greatly affected by what you eat and drink.  A healthy meal plan will help you control your blood glucose and maintain a healthy lifestyle.  Your health care provider may recommend that you work with a dietitian to make a meal plan that is best for you.  Keep in mind that carbohydrates (carbs) and alcohol have immediate effects on your blood glucose levels. It is important to count carbs and to use alcohol carefully. This information is not intended to replace advice given to you by your health care provider. Make sure you discuss any questions you have with your health care provider. Document Revised: 01/11/2019 Document Reviewed: 01/11/2019 Elsevier Patient Education  2021 Elsevier Inc.  

## 2020-11-01 NOTE — Progress Notes (Signed)
Endocrinology Consult Note       11/01/2020, 2:31 PM   Subjective:    Patient ID: Tony Combs, male    DOB: [birth year].  MICHEL HENDON is being seen in consultation for management of currently uncontrolled symptomatic diabetes requested by  Curlene Labrum, MD.   Past Medical History:  Diagnosis Date   Blockage of coronary artery of heart ([place])    Diabetes mellitus without complication ([place])    Hyperlipidemia    Hypertension    Stroke [facility])     Past Surgical History:  Procedure Laterality Date   HERNIA REPAIR      Social History   Socioeconomic History   Marital status: Married    Spouse name: Not on file   Number of children: Not on file   Years of education: Not on file   Highest education level: Not on file  Occupational History   Not on file  Tobacco Use   Smoking status: Never   Smokeless tobacco: Never  Vaping Use   Vaping Use: Never used  Substance and Sexual Activity   Alcohol use: Not Currently   Drug use: Not Currently   Sexual activity: Not Currently  Other Topics Concern   Not on file  Social History Narrative   Not on file   Social Determinants of Health   Financial Resource Strain: Not on file  Food Insecurity: Not on file  Transportation Needs: Not on file  Physical Activity: Not on file  Stress: Not on file  Social Connections: Not on file    Family History  Problem Relation Age of Onset   Heart failure Father    Heart attack Father    Hypertension Father    Diabetes type II Father     Outpatient Encounter Medications as of 11/01/2020  Medication Sig   aspirin EC 81 MG tablet Take 81 mg by mouth daily. Swallow whole.   atorvastatin (LIPITOR) 80 MG tablet Take 1 tablet (80 mg total) by mouth daily.   B-D UF III MINI PEN NEEDLES 31G X 5 MM MISC SMARTSIG:1 Pre-Filled Pen Syringe SUB-Q 4 Times Daily   blood glucose meter kit and supplies Dispense based on  patient and insurance preference. Use up to four times daily as directed. (FOR ICD-10 E10.9, E11.9).   carvedilol (COREG) 6.25 MG tablet Take 1 tablet (6.25 mg total) by mouth 2 (two) times daily with a meal.   clopidogrel (PLAVIX) 75 MG tablet Take 1 tablet (75 mg total) by mouth daily.   ENTRESTO 24-26 MG Take 1 tablet by mouth 2 (two) times daily.   Insulin Pen Needle (PEN NEEDLES) 30G X 5 MM MISC 1 pen by Does not apply route 4 (four) times daily - after meals and at bedtime.   spironolactone (ALDACTONE) 25 MG tablet Take 1 tablet (25 mg total) by mouth daily.   [DISCONTINUED] Insulin Aspart PenFill 100 UNIT/ML SOCT Inject 5 Units into the skin 3 (three) times daily before meals.   [DISCONTINUED] insulin glargine, 1 Unit Dial, (TOUJEO) 300 UNIT/ML Solostar Pen Inject 17 Units into the skin in the morning and at bedtime.   Insulin Aspart PenFill 100 UNIT/ML SOCT Inject 4-7  Units into the skin 3 (three) times daily before meals.   insulin glargine, 1 Unit Dial, (TOUJEO) 300 UNIT/ML Solostar Pen Inject 25 Units into the skin at bedtime.   No facility-administered encounter medications on file as of 11/01/2020.    ALLERGIES: No Known Allergies  VACCINATION STATUS:  There is no immunization history on file for this patient.  Diabetes He presents for his initial diabetic visit. He has type 2 diabetes mellitus. Onset time: Diagnosed at approx age of 60. Hypoglycemia symptoms include nervousness/anxiousness, sweats and tremors. Associated symptoms include fatigue. Pertinent negatives for diabetes include no weight loss. Hypoglycemia complications include nocturnal hypoglycemia. Symptoms are stable. Diabetic complications include a CVA and heart disease. Risk factors for coronary artery disease include diabetes mellitus, dyslipidemia, family history, male sex, obesity, hypertension and sedentary lifestyle. Current diabetic treatment includes intensive insulin program. He is compliant with treatment  most of the time. His weight is stable. He is following a generally unhealthy diet. When asked about meal planning, he reported none. He has not had a previous visit with a dietitian. He rarely participates in exercise. His home blood glucose trend is decreasing steadily. His breakfast blood glucose range is generally 90-110 mg/dl. His lunch blood glucose range is generally 110-130 mg/dl. His dinner blood glucose range is generally 180-200 mg/dl. His bedtime blood glucose range is generally 140-180 mg/dl. His overall blood glucose range is 140-180 mg/dl. (He presents today for his consultation, accompanied by his wife, with his CGM available to review showing tight fasting and near target postprandial glycemic profile.  His most recent A1c from 09/23/20 was 10.5%, improving from previous A1c of over 12%.  He had a stroke back in July of 2022 and he is still suffering some lingering effects from it, mainly dealing with swallowing.  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  He does engage in routine physical activity by walking frequently.  He is due for an eye exam.  He has recently had low glucose readings overnight.) An ACE inhibitor/angiotensin II receptor blocker is being taken. He does not see a podiatrist.Eye exam is current.  Hypertension This is a chronic problem. The current episode started more than 1 year ago. The problem has been resolved since onset. The problem is controlled. Associated symptoms include sweats. There are no associated agents to hypertension. Risk factors for coronary artery disease include diabetes mellitus, dyslipidemia, family history, obesity, male gender and sedentary lifestyle. Past treatments include beta blockers, diuretics and angiotensin blockers. The current treatment provides significant improvement. There are no compliance problems.  Hypertensive end-organ  damage includes CAD/MI and CVA.  Hyperlipidemia This is a chronic problem. The current episode started more than 1 year ago. The problem is controlled. Recent lipid tests were reviewed and are normal. Exacerbating diseases include diabetes and obesity. Factors aggravating his hyperlipidemia include beta blockers and fatty foods. Current antihyperlipidemic treatment includes statins. The current treatment provides moderate improvement of lipids. Compliance problems include adherence to diet and adherence to exercise.  Risk factors for coronary artery disease include diabetes mellitus, dyslipidemia, family history, hypertension, male sex, obesity and a sedentary lifestyle.    Review of systems  Constitutional: + Minimally fluctuating body weight, current Body mass index is 33.45 kg/m., + fatigue, no subjective hyperthermia, no subjective hypothermia Eyes: no blurry vision, no xerophthalmia ENT: no sore throat, no nodules palpated in throat, + dysphagia from  previous stroke, no hoarseness Cardiovascular: no chest pain, no shortness of breath, no palpitations, no leg swelling Respiratory: no cough, no shortness of breath Gastrointestinal: no nausea/vomiting/diarrhea Musculoskeletal: no muscle/joint aches Skin: no rashes, no hyperemia Neurological: no tremors, no numbness, no tingling, no dizziness Psychiatric: no depression, no anxiety  Objective:     BP 112/68   Pulse 96   Ht 5' 8" (1.727 m)   Wt 220 lb (99.8 kg)   BMI 33.45 kg/m   Wt Readings from Last 3 Encounters:  11/01/20 220 lb (99.8 kg)  08/30/20 224 lb (101.6 kg)     BP Readings from Last 3 Encounters:  11/01/20 112/68  09/03/20 121/77     Physical Exam- Limited  Constitutional:  Body mass index is 33.45 kg/m. , not in acute distress, normal state of mind Eyes:  EOMI, no exophthalmos Neck: Supple Cardiovascular: RRR, no murmurs, rubs, or gallops, no edema Respiratory: Adequate breathing efforts, no crackles, rales,  rhonchi, or wheezing Musculoskeletal: no gross deformities, strength intact in all four extremities, no gross restriction of joint movements Skin:  no rashes, no hyperemia Neurological: no tremor with outstretched hands    CMP ( most recent) CMP     Component Value Date/Time   NA 137 09/02/2020 0451   K 5.6 (A) 09/23/2020 0000   CL 101 09/02/2020 0451   CO2 31 09/02/2020 0451   GLUCOSE 161 (H) 09/02/2020 0451   BUN 10 09/23/2020 0000   CREATININE 0.9 09/23/2020 0000   CREATININE 0.86 09/02/2020 0451   CALCIUM 8.8 (L) 09/02/2020 0451   PROT 6.5 09/02/2020 0451   ALBUMIN 3.2 (L) 09/02/2020 0451   AST 26 09/02/2020 0451   ALT 38 09/02/2020 0451   ALKPHOS 66 09/02/2020 0451   BILITOT 0.9 09/02/2020 0451   GFRNONAA 95 09/23/2020 0000   GFRNONAA >60 09/02/2020 0451     Diabetic Labs (most recent): Lab Results  Component Value Date   HGBA1C 10.5 09/23/2020   HGBA1C 11.7 (H) 08/31/2020     Lipid Panel ( most recent) Lipid Panel     Component Value Date/Time   CHOL 143 08/31/2020 0321   TRIG 281 (H) 08/31/2020 0321   HDL 26 (L) 08/31/2020 0321   CHOLHDL 5.5 08/31/2020 0321   VLDL 56 (H) 08/31/2020 0321   LDLCALC 61 08/31/2020 0321      Lab Results  Component Value Date   TSH 0.787 09/01/2020           Assessment & Plan:   1) Type 2 diabetes mellitus with hyperglycemia, with long-term current use of insulin ([place])  He presents today for his consultation, accompanied by his wife, with his CGM available to review showing tight fasting and near target postprandial glycemic profile.  His most recent A1c from 09/23/20 was 10.5%, improving from previous A1c of over 12%.  He had a stroke back in July of 2022 and he is still suffering some lingering effects from it, mainly dealing with swallowing.  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  He does engage in  routine physical activity by walking frequently.  He is due for an eye exam.  He has recently had low glucose readings overnight.  Analysis of his CGM shows TIR 71%, TAR 19%, TBR 0%.  - QUINCE SANTANA has currently uncontrolled symptomatic type 2 DM since 60 years of age, with most recent  A1c of 10.5 %.   -Recent labs reviewed.  - I had a long discussion with him about the progressive nature of diabetes and the pathology behind its complications. -his diabetes is complicated by CVA, CAD and he remains at a high risk for more acute and chronic complications which include CAD, CVA, CKD, retinopathy, and neuropathy. These are all discussed in detail with him.  - I have counseled him on diet and weight management by adopting a carbohydrate restricted/protein rich diet. Patient is encouraged to switch to unprocessed or minimally processed complex starch and increased protein intake (animal or plant source), fruits, and vegetables. -  he is advised to stick to a routine mealtimes to eat 3 meals a day and avoid unnecessary snacks (to snack only to correct hypoglycemia).   - he acknowledges that there is a room for improvement in his food and drink choices. - Suggestion is made for him to avoid simple carbohydrates from his diet including Cakes, Sweet Desserts, Ice Cream, Soda (diet and regular), Sweet Tea, Candies, Chips, Cookies, Store Bought Juices, Alcohol in Excess of 1-2 drinks a day, Artificial Sweeteners, Coffee Creamer, and "Sugar-free" Products. This will help patient to have more stable blood glucose profile and potentially avoid unintended weight gain.  - he will be scheduled with Jearld Fenton, RDN, CDE for diabetes education.  She has availability to see him today.  - I have approached him with the following individualized plan to manage his diabetes and patient agrees:   -He is advised to change how he takes his Toujeo to 25 units SQ nightly (instead of splitting doses) and adjust his  Novolog slightly to 4-7 units TID with meals if glucose is above 90 and he is eating (Specific instructions on how to titrate insulin dosage based on glucose readings given to patient in writing).  They demonstrated their ability to follow the SSI chart properly with me today.   -he is encouraged to continue monitoring glucose 4 times daily (using his CGM), before meals and before bed, to log their readings on the clinic sheets provided, and bring them to review at follow up appointment in 2 weeks.  - he is warned not to take insulin without proper monitoring per orders. - Adjustment parameters are given to him for hypo and hyperglycemia in writing. - he is encouraged to call clinic for blood glucose levels less than 70 or above 300 mg /dl.  - he was on Rybelsus and Metformin in the past but was taken off during his hospitalization for his stroke.  - Specific targets for  A1c; LDL, HDL, and Triglycerides were discussed with the patient.  2) Blood Pressure /Hypertension:  his blood pressure is controlled to target.   he is advised to continue his current medications including Coreg 6.25 mg p.o. twice daily, Entresto 24-26 mg po daily, and Spironolactone 25 mg po daily.  3) Lipids/Hyperlipidemia:    Review of his recent lipid panel from 08/31/20 showed controlled LDL at 61 and elevated triglycerides of 281 .  he is advised to continue Lipitor 80 mg daily at bedtime.  Side effects and precautions discussed with him.  4)  Weight/Diet:  his Body mass index is 33.45 kg/m.  -  clearly complicating his diabetes care.   he is a candidate for weight loss. I discussed with him the fact that loss of 5 - 10% of his  current body weight will have the most impact on his diabetes management.  Exercise, and detailed carbohydrates information  provided  -  detailed on discharge instructions.  5) Chronic Care/Health Maintenance: -he is on ACEI/ARB and Statin medications and is encouraged to initiate and continue  to follow up with Ophthalmology, Dentist, Podiatrist at least yearly or according to recommendations, and advised to stay away from smoking. I have recommended yearly flu vaccine and pneumonia vaccine at least every 5 years; moderate intensity exercise for up to 150 minutes weekly; and sleep for at least 7 hours a day.  - he is advised to maintain close follow up with [place], Virgina Evener, MD for primary care needs, as well as his other providers for optimal and coordinated care.   - Time spent in this patient care: 60 min, of which > 50% was spent in counseling him about his diabetes and the rest reviewing his blood glucose logs, discussing his hypoglycemia and hyperglycemia episodes, reviewing his current and previous labs/studies (including abstraction from other facilities) and medications doses and developing a long term treatment plan based on the latest standards of care/guidelines; and documenting his care.    Please refer to Patient Instructions for Blood Glucose Monitoring and Insulin/Medications Dosing Guide" in media tab for additional information. Please also refer to "Patient Self Inventory" in the Media tab for reviewed elements of pertinent patient history.  Tony Combs participated in the discussions, expressed understanding, and voiced agreement with the above plans.  All questions were answered to his satisfaction. he is encouraged to contact clinic should he have any questions or concerns prior to his return visit.     Follow up plan: - Return in about 2 weeks (around 11/15/2020) for Bring meter and logs, Diabetes F/U.    Rayetta Pigg, [facility] [address] [place], [place] [address] Phone: [phone] Fax: [phone]  11/01/2020, 2:31 PM

## 2020-11-05 ENCOUNTER — Ambulatory Visit (HOSPITAL_COMMUNITY): Payer: BC Managed Care – PPO | Admitting: Physical Therapy

## 2020-11-05 ENCOUNTER — Encounter (HOSPITAL_COMMUNITY): Payer: BC Managed Care – PPO | Admitting: Speech Pathology

## 2020-11-06 ENCOUNTER — Encounter: Payer: Self-pay | Admitting: Physician Assistant

## 2020-11-06 ENCOUNTER — Other Ambulatory Visit: Payer: Self-pay

## 2020-11-06 ENCOUNTER — Ambulatory Visit (INDEPENDENT_AMBULATORY_CARE_PROVIDER_SITE_OTHER): Payer: BC Managed Care – PPO | Admitting: Physician Assistant

## 2020-11-06 VITALS — BP 130/80 | HR 96 | Ht 68.0 in | Wt 216.8 lb

## 2020-11-06 DIAGNOSIS — I519 Heart disease, unspecified: Secondary | ICD-10-CM | POA: Diagnosis not present

## 2020-11-06 DIAGNOSIS — E875 Hyperkalemia: Secondary | ICD-10-CM | POA: Diagnosis not present

## 2020-11-06 DIAGNOSIS — E785 Hyperlipidemia, unspecified: Secondary | ICD-10-CM

## 2020-11-06 DIAGNOSIS — E119 Type 2 diabetes mellitus without complications: Secondary | ICD-10-CM

## 2020-11-06 DIAGNOSIS — I10 Essential (primary) hypertension: Secondary | ICD-10-CM

## 2020-11-06 DIAGNOSIS — Z8673 Personal history of transient ischemic attack (TIA), and cerebral infarction without residual deficits: Secondary | ICD-10-CM

## 2020-11-06 LAB — BASIC METABOLIC PANEL
BUN/Creatinine Ratio: 14 (ref 10–24)
BUN: 11 mg/dL (ref 8–27)
CO2: 26 mmol/L (ref 20–29)
Calcium: 9.4 mg/dL (ref 8.6–10.2)
Chloride: 101 mmol/L (ref 96–106)
Creatinine, Ser: 0.77 mg/dL (ref 0.76–1.27)
Glucose: 138 mg/dL — ABNORMAL HIGH (ref 65–99)
Potassium: 5.2 mmol/L (ref 3.5–5.2)
Sodium: 141 mmol/L (ref 134–144)
eGFR: 101 mL/min/{1.73_m2} (ref >59)

## 2020-11-06 MED ORDER — ENTRESTO 49-51 MG PO TABS: 1.0000 | ORAL_TABLET | Freq: Two times a day (BID) | ORAL | 5 refills | Status: DC

## 2020-11-06 NOTE — Patient Instructions (Signed)
Medication Instructions:  INCREASE Entresto to 49-51 mg 2 times a day  *If you need a refill on your cardiac medications before your next appointment, please call your pharmacy*  Lab Work: Your physician recommends that you return for lab work TODAY:  BMET  Your physician recommends that you return for lab work in 2 months:  Fasting Lipid Panel-DO NOT EAT OR DRINK PAST MIDNIGHT. OKAY TO HAVE WATER. Hepatic (Liver) Function Test  If you have labs (blood work) drawn today and your tests are completely normal, you will receive your results only by: MyChart Message (if you have MyChart) OR A paper copy in the mail If you have any lab test that is abnormal or we need to change your treatment, we will call you to review the results.  Testing/Procedures: Your physician has requested that you have an echocardiogram. Echocardiography is a painless test that uses sound waves to create images of your heart. It provides your doctor with information about the size and shape of your heart and how well your heart's chambers and valves are working. This procedure takes approximately one hour. There are no restrictions for this procedure.  Please schedule for 2 months at [facility] office   Follow-Up: At [facility], you and your health needs are our priority.  As part of our continuing mission to provide you with exceptional heart care, we have created designated Provider Care Teams.  These Care Teams include your primary Cardiologist (physician) and Advanced Practice Providers (APPs -  Physician Assistants and Nurse Practitioners) who all work together to provide you with the care you need, when you need it.  We recommend signing up for the patient portal called "MyChart".  Sign up information is provided on this After Visit Summary.  MyChart is used to connect with patients for Virtual Visits (Telemedicine).  Patients are able to view lab/test results, encounter notes, upcoming appointments, etc.   Non-urgent messages can be sent to your provider as well.   To learn more about what you can do with MyChart, go to [email or web address].    Your next appointment:   3 month(s)  The format for your next appointment:   In Person  Provider:   Bryan Lemma, MD  Other Instructions

## 2020-11-06 NOTE — Progress Notes (Signed)
Cardiology Office Note:    Date:  11/07/2020   ID:  Tony Combs, Tony Combs 1960-08-20, MRN [id number]  PCP:  Tony Labrum, MD   [facility] Providers Cardiologist:  Tony Hew, MD     Referring MD: Tony Labrum, MD   Chief Complaint  Patient presents with   Follow-up    Seen for Dr. Ellyn Combs    History of Present Illness:    Tony Combs is a 60 y.o. male with a hx of hypertension, hyperlipidemia, DM2, and history of CVA.  He has a remote history of smoking however quit in his 60s.  He does have family history of heart disease with his father having an MI at age 48.  He initially presented to the ED on 08/30/2020 with slurred speech and vision changes.  He was evaluated at [facility] however left AMA after long wait.  Head CT at the time showed no acute intracranial findings.  Brain MRI showed 1 mm acute infarct in the anterior medial right frontal lobe as well as tiny chronic lacunar infarct within the left thalamus, mild to moderate stenosis in the distal A2 segment of the right anterior cerebral artery, severe stenosis of the left posterior cerebral artery P1/P2 junction, 1 to 2 mm inferior projecting vascular protrusion arising from the supraclinoid left ICA.  Echocardiogram obtained during the admission showed EF 30 to 35% with global hypokinesis, no significant vascular disease.  Cardiology service was consulted for new LV dysfunction.  Home amlodipine was discontinued and that he was started on carvedilol and losartan.   Since his discharge, according to his wife, most recent hemoglobin A1c has came down to 7.  I am unable to to see this last hemoglobin A1c lab work that was done last week.  Otherwise he denies any recent chest pain.  He has no significant dyspnea with physical activity.  He still feels he is fairly weak.  Blood pressure is elevated in the office.  Manual recheck by myself showed blood pressure was 539/76, however systolic blood pressure has been as low  as 116 at home.  I recommend he increase Entresto to 49-51 mg twice a day.  Lab work from last week revealed hyperkalemia however no follow-up lab work was done so far.  I will order a repeat basic metabolic panel today.  He is due for fasting lipid panel and a liver function test in the next 2 months.  He will need a echocardiogram in 2 months as well prior to follow-up with Dr. Ellyn Combs in 3 months.  He appears to be euvolemic on physical exam today.   Past Medical History:  Diagnosis Date   Blockage of coronary artery of heart (HCC)    Diabetes mellitus without complication ([place])    Hyperlipidemia    Hypertension    Stroke [facility])     Past Surgical History:  Procedure Laterality Date   HERNIA REPAIR      Current Medications: Current Meds  Medication Sig   aspirin EC 81 MG tablet Take 81 mg by mouth daily. Swallow whole.   B-D UF III MINI PEN NEEDLES 31G X 5 MM MISC SMARTSIG:1 Pre-Filled Pen Syringe SUB-Q 4 Times Daily   blood glucose meter kit and supplies Dispense based on patient and insurance preference. Use up to four times daily as directed. (FOR ICD-10 E10.9, E11.9).   clopidogrel (PLAVIX) 75 MG tablet Take 1 tablet (75 mg total) by mouth daily.   Insulin Aspart PenFill 100 UNIT/ML  SOCT Inject 4-7 Units into the skin 3 (three) times daily before meals.   insulin glargine, 1 Unit Dial, (TOUJEO) 300 UNIT/ML Solostar Pen Inject 25 Units into the skin at bedtime.   Insulin Pen Needle (PEN NEEDLES) 30G X 5 MM MISC 1 pen by Does not apply route 4 (four) times daily - after meals and at bedtime.   sacubitril-valsartan (ENTRESTO) 49-51 MG Take 1 tablet by mouth 2 (two) times daily.   [DISCONTINUED] ENTRESTO 24-26 MG Take 1 tablet by mouth 2 (two) times daily.     Allergies:   Patient has no known allergies.   Social History   Socioeconomic History   Marital status: Married    Spouse name: Not on file   Number of children: Not on file   Years of education: Not on file   Highest  education level: Not on file  Occupational History   Not on file  Tobacco Use   Smoking status: Never   Smokeless tobacco: Never  Vaping Use   Vaping Use: Never used  Substance and Sexual Activity   Alcohol use: Not Currently   Drug use: Not Currently   Sexual activity: Not Currently  Other Topics Concern   Not on file  Social History Narrative   Not on file   Social Determinants of Health   Financial Resource Strain: Not on file  Food Insecurity: Not on file  Transportation Needs: Not on file  Physical Activity: Not on file  Stress: Not on file  Social Connections: Not on file     Family History: The patient's family history includes Diabetes type II in his father; Heart attack in his father; Heart failure in his father; Hypertension in his father.  ROS:   Please see the history of present illness.     All other systems reviewed and are negative.  EKGs/Labs/Other Studies Reviewed:    The following studies were reviewed today:  Echo 08/31/2020  1. No LV thrombus on contrast imaging. Left ventricular ejection  fraction, by estimation, is 30 to 35%. The left ventricle has moderately  decreased function. The left ventricle demonstrates global hypokinesis.  There is moderate concentric left  ventricular hypertrophy. Indeterminate diastolic filling due to E-A  fusion.   2. Right ventricular systolic function is normal. The right ventricular  size is normal. Tricuspid regurgitation signal is inadequate for assessing  PA pressure.   3. Left atrial size was mildly dilated.   4. The mitral valve is grossly normal. Trivial mitral valve  regurgitation. No evidence of mitral stenosis.   5. The aortic valve is tricuspid. Aortic valve regurgitation is not  visualized. No aortic stenosis is present.   Comparison(s): No prior Echocardiogram.   EKG:  EKG is ordered today.  The ekg ordered today demonstrates normal sinus rhythm, T wave inversion in the lateral leads.  Recent  Labs: 09/01/2020: Magnesium 2.1; TSH 0.787 09/02/2020: ALT 38; Hemoglobin 15.2; Platelets 202 11/06/2020: BUN 11; Creatinine, Ser 0.77; Potassium 5.2; Sodium 141  Recent Lipid Panel    Component Value Date/Time   CHOL 143 08/31/2020 0321   TRIG 281 (H) 08/31/2020 0321   HDL 26 (L) 08/31/2020 0321   CHOLHDL 5.5 08/31/2020 0321   VLDL 56 (H) 08/31/2020 0321   LDLCALC 61 08/31/2020 0321     Risk Assessment/Calculations:           Physical Exam:    VS:  BP 130/80 (BP Location: Right Arm)   Pulse 96   Ht _0  (  1.727 m)   Wt 216 lb 12.8 oz (98.3 kg)   SpO2 98%   BMI 32.96 kg/m     Wt Readings from Last 3 Encounters:  11/06/20 216 lb 12.8 oz (98.3 kg)  11/01/20 220 lb (99.8 kg)  08/30/20 224 lb (101.6 kg)     GEN:  Well nourished, well developed in no acute distress HEENT: Normal NECK: No JVD; No carotid bruits LYMPHATICS: No lymphadenopathy CARDIAC: RRR, no murmurs, rubs, gallops RESPIRATORY:  Clear to auscultation without rales, wheezing or rhonchi  ABDOMEN: Soft, non-tender, non-distended MUSCULOSKELETAL:  No edema; No deformity  SKIN: Warm and dry NEUROLOGIC:  Alert and oriented x 3 PSYCHIATRIC:  Normal affect   ASSESSMENT:    1. Asymptomatic LV dysfunction   2. Hyperlipidemia, unspecified hyperlipidemia type   3. Hyperkalemia   4. Primary hypertension   5. Controlled type 2 diabetes mellitus without complication, without long-term current use of insulin ([place])   6. H/O: CVA (cerebrovascular accident)    PLAN:    In order of problems listed above:  Asymptomatic LV dysfunction: Occurred in the setting of acute CVA.  Continue carvedilol, spironolactone, increase Entresto to 49-51 mg twice a day.  Obtain echocardiogram in 2 months.  If EF is still low, will consider ischemic work-up  Hyperlipidemia: On Lipitor  Hyperkalemia: Obtain repeat basic metabolic panel  Hypertension: Blood pressure remains borderline elevated, increase Entresto to 49-51 mg twice a  day  DM2: According to family, his hemoglobin A1c has came down quite significantly.  I am only able to see the most recent blood work.  Family says his last hemoglobin A1c was less than 7.0  History of CVA: Recent acute CVA.  Continue aspirin and Plavix.        Medication Adjustments/Labs and Tests Ordered: Current medicines are reviewed at length with the patient today.  Concerns regarding medicines are outlined above.  Orders Placed This Encounter  Procedures   Basic metabolic panel   Hepatic function panel   Lipid panel   EKG 12-Lead   ECHOCARDIOGRAM COMPLETE   Meds ordered this encounter  Medications   sacubitril-valsartan (ENTRESTO) 49-51 MG    Sig: Take 1 tablet by mouth 2 (two) times daily.    Dispense:  60 tablet    Refill:  5    Patient Instructions  Medication Instructions:  INCREASE Entresto to 49-51 mg 2 times a day  *If you need a refill on your cardiac medications before your next appointment, please call your pharmacy*  Lab Work: Your physician recommends that you return for lab work TODAY:  BMET  Your physician recommends that you return for lab work in 2 months:  Fasting Lipid Panel-DO NOT EAT OR DRINK PAST MIDNIGHT. OKAY TO HAVE WATER. Hepatic (Liver) Function Test  If you have labs (blood work) drawn today and your tests are completely normal, you will receive your results only by: MyChart Message (if you have MyChart) OR A paper copy in the mail If you have any lab test that is abnormal or we need to change your treatment, we will call you to review the results.  Testing/Procedures: Your physician has requested that you have an echocardiogram. Echocardiography is a painless test that uses sound waves to create images of your heart. It provides your doctor with information about the size and shape of your heart and how well your heart's chambers and valves are working. This procedure takes approximately one hour. There are no restrictions for this  procedure.  Please  schedule for 2 months at [facility] office   Follow-Up: At [facility], you and your health needs are our priority.  As part of our continuing mission to provide you with exceptional heart care, we have created designated Provider Care Teams.  These Care Teams include your primary Cardiologist (physician) and Advanced Practice Providers (APPs -  Physician Assistants and Nurse Practitioners) who all work together to provide you with the care you need, when you need it.  We recommend signing up for the patient portal called "MyChart".  Sign up information is provided on this After Visit Summary.  MyChart is used to connect with patients for Virtual Visits (Telemedicine).  Patients are able to view lab/test results, encounter notes, upcoming appointments, etc.  Non-urgent messages can be sent to your provider as well.   To learn more about what you can do with MyChart, go to [email or web address].    Your next appointment:   3 month(s)  The format for your next appointment:   In Person  Provider:   [name], MD  Other Instructions    Signed, Almyra Deforest, [place]  11/07/2020 10:58 PM    [place]

## 2020-11-07 ENCOUNTER — Encounter: Payer: Self-pay | Admitting: Physician Assistant

## 2020-11-08 ENCOUNTER — Ambulatory Visit (HOSPITAL_COMMUNITY): Payer: BC Managed Care – PPO | Admitting: Physical Therapy

## 2020-11-08 ENCOUNTER — Encounter (HOSPITAL_COMMUNITY): Payer: BC Managed Care – PPO | Admitting: Speech Pathology

## 2020-11-14 ENCOUNTER — Telehealth: Payer: Self-pay | Admitting: Cardiology

## 2020-11-14 NOTE — Telephone Encounter (Signed)
Patient's wife calling to follow up on the patient's FMLA paper work. She states a fax was sent 9/23 and requested a second one be sent, because they have not heard of anything yet. She would like a call back to let her know if anything has been received and what the status is.

## 2020-11-14 NOTE — Telephone Encounter (Signed)
Returning call to patient wife (okay per DPR). We have not received any forms for patient. She asking about medical records , I informed her if they just needed medical records they would have to come into the office and speak with medical records. She was going to call pt employer to see exactly what was needed.

## 2020-11-15 ENCOUNTER — Other Ambulatory Visit: Payer: Self-pay

## 2020-11-15 ENCOUNTER — Ambulatory Visit: Payer: BC Managed Care – PPO | Admitting: Nutrition

## 2020-11-15 ENCOUNTER — Encounter: Payer: Self-pay | Admitting: Nurse Practitioner

## 2020-11-15 ENCOUNTER — Ambulatory Visit (INDEPENDENT_AMBULATORY_CARE_PROVIDER_SITE_OTHER): Payer: BC Managed Care – PPO | Admitting: Nurse Practitioner

## 2020-11-15 VITALS — BP 115/76 | HR 77 | Ht 68.0 in | Wt 217.4 lb

## 2020-11-15 DIAGNOSIS — E1165 Type 2 diabetes mellitus with hyperglycemia: Secondary | ICD-10-CM

## 2020-11-15 DIAGNOSIS — I10 Essential (primary) hypertension: Secondary | ICD-10-CM | POA: Diagnosis not present

## 2020-11-15 DIAGNOSIS — E782 Mixed hyperlipidemia: Secondary | ICD-10-CM | POA: Diagnosis not present

## 2020-11-15 DIAGNOSIS — Z794 Long term (current) use of insulin: Secondary | ICD-10-CM

## 2020-11-15 LAB — POCT UA - MICROALBUMIN
Albumin/Creatinine Ratio, Urine, POC: <30
Creatinine, POC: 300 mg/dL
Microalbumin Ur, POC: 10 mg/L

## 2020-11-15 NOTE — Progress Notes (Signed)
Endocrinology Follow Up Note       11/15/2020, 9:49 AM   Subjective:    Patient ID: Tony Combs, male    DOB: 1960-11-17.  Tony Combs is being seen in follow up after being seen in consultation for management of currently uncontrolled symptomatic diabetes requested by  Curlene Labrum, MD.   Past Medical History:  Diagnosis Date   Blockage of coronary artery of heart ([place])    Diabetes mellitus without complication ([place])    Hyperlipidemia    Hypertension    [facility])     Past Surgical History:  Procedure Laterality Date   HERNIA REPAIR      Social History   Socioeconomic History   Marital status: Married    Spouse name: Not on file   Number of children: Not on file   Years of education: Not on file   Highest education level: Not on file  Occupational History   Not on file  Tobacco Use   Smoking status: Never   Smokeless tobacco: Never  Vaping Use   Vaping Use: Never used  Substance and Sexual Activity   Alcohol use: Not Currently   Drug use: Not Currently   Sexual activity: Not Currently  Other Topics Concern   Not on file  Social History Narrative   Not on file   Social Determinants of Health   Financial Resource Strain: Not on file  Food Insecurity: Not on file  Transportation Needs: Not on file  Physical Activity: Not on file  Stress: Not on file  Social Connections: Not on file    Family History  Problem Relation Age of Onset   Heart failure Father    Heart attack Father    Hypertension Father    Diabetes type II Father     Outpatient Encounter Medications as of 11/15/2020  Medication Sig   aspirin EC 81 MG tablet Take 81 mg by mouth daily. Swallow whole.   B-D UF III MINI PEN NEEDLES 31G X 5 MM MISC SMARTSIG:1 Pre-Filled Pen Syringe SUB-Q 4 Times Daily   blood glucose meter kit and supplies Dispense based on patient and insurance preference. Use up to four times  daily as directed. (FOR ICD-10 E10.9, E11.9).   clopidogrel (PLAVIX) 75 MG tablet Take 1 tablet (75 mg total) by mouth daily.   Insulin Aspart PenFill 100 UNIT/ML SOCT Inject 4-7 Units into the skin 3 (three) times daily before meals.   insulin glargine, 1 Unit Dial, (TOUJEO) 300 UNIT/ML Solostar Pen Inject 25 Units into the skin at bedtime.   Insulin Pen Needle (PEN NEEDLES) 30G X 5 MM MISC 1 pen by Does not apply route 4 (four) times daily - after meals and at bedtime.   sacubitril-valsartan (ENTRESTO) 49-51 MG Take 1 tablet by mouth 2 (two) times daily.   atorvastatin (LIPITOR) 80 MG tablet Take 1 tablet (80 mg total) by mouth daily.   carvedilol (COREG) 6.25 MG tablet Take 1 tablet (6.25 mg total) by mouth 2 (two) times daily with a meal.   spironolactone (ALDACTONE) 25 MG tablet Take 1 tablet (25 mg total) by mouth daily.   No facility-administered encounter medications on file  as of 11/15/2020.    ALLERGIES: No Known Allergies  VACCINATION STATUS:  There is no immunization history on file for this patient.  Diabetes He presents for his follow-up diabetic visit. He has type 2 diabetes mellitus. Onset time: Diagnosed at approx age of 60. His disease course has been improving. There are no hypoglycemic associated symptoms. Associated symptoms include fatigue. Pertinent negatives for diabetes include no weight loss. There are no hypoglycemic complications. Symptoms are stable. Diabetic complications include a CVA and heart disease. Risk factors for coronary artery disease include diabetes mellitus, dyslipidemia, family history, male sex, obesity, hypertension and sedentary lifestyle. Current diabetic treatment includes intensive insulin program. He is compliant with treatment most of the time. His weight is stable. He is following a generally healthy diet. When asked about meal planning, he reported none. He has not had a previous visit with a dietitian. He rarely participates in exercise. His  home blood glucose trend is decreasing steadily. His overall blood glucose range is 140-180 mg/dl. (He presents today, accompanied by his wife, with his CGM showing at goal fasting and postprandial glycemic profile.  He was not due for another A1c today.  Analysis of his CGM shows TIR 77%, TAR 23%, TBR 0%.  He is working hard on his diet and exercise.) An ACE inhibitor/angiotensin II receptor blocker is being taken. He does not see a podiatrist.Eye exam is current.  Hypertension This is a chronic problem. The current episode started more than 1 year ago. The problem has been resolved since onset. The problem is controlled. There are no associated agents to hypertension. Risk factors for coronary artery disease include diabetes mellitus, dyslipidemia, family history, obesity, male gender and sedentary lifestyle. Past treatments include beta blockers, diuretics and angiotensin blockers. The current treatment provides significant improvement. There are no compliance problems.  Hypertensive end-organ damage includes CAD/MI and CVA.  Hyperlipidemia This is a chronic problem. The current episode started more than 1 year ago. The problem is controlled. Recent lipid tests were reviewed and are normal. Exacerbating diseases include diabetes and obesity. Factors aggravating his hyperlipidemia include beta blockers and fatty foods. Current antihyperlipidemic treatment includes statins. The current treatment provides moderate improvement of lipids. Compliance problems include adherence to diet and adherence to exercise.  Risk factors for coronary artery disease include diabetes mellitus, dyslipidemia, family history, hypertension, male sex, obesity and a sedentary lifestyle.    Review of systems  Constitutional: + Minimally fluctuating body weight, current Body mass index is 33.06 kg/m., + fatigue, no subjective hyperthermia, no subjective hypothermia Eyes: no blurry vision, no xerophthalmia ENT: no sore throat, no  nodules palpated in throat, + dysphagia from previous stroke, no hoarseness Cardiovascular: no chest pain, no shortness of breath, no palpitations, no leg swelling Respiratory: no cough, no shortness of breath Gastrointestinal: no nausea/vomiting/diarrhea Musculoskeletal: no muscle/joint aches Skin: no rashes, no hyperemia Neurological: no tremors, no numbness, no tingling, no dizziness Psychiatric: no depression, no anxiety  Objective:     BP 115/76   Pulse 77   Ht _0  (1.727 m)   Wt 217 lb 6.4 oz (98.6 kg)   BMI 33.06 kg/m   Wt Readings from Last 3 Encounters:  11/15/20 217 lb 6.4 oz (98.6 kg)  11/06/20 216 lb 12.8 oz (98.3 kg)  11/01/20 220 lb (99.8 kg)     BP Readings from Last 3 Encounters:  11/15/20 115/76  11/06/20 130/80  11/01/20 112/68     Physical Exam- Limited  Constitutional:  Body mass index  is 33.06 kg/m. , not in acute distress, normal state of mind Eyes:  EOMI, no exophthalmos Neck: Supple Cardiovascular: RRR, no murmurs, rubs, or gallops, no edema Respiratory: Adequate breathing efforts, no crackles, rales, rhonchi, or wheezing Musculoskeletal: no gross deformities, strength intact in all four extremities, no gross restriction of joint movements Skin:  no rashes, no hyperemia Neurological: no tremor with outstretched hands   POCT ABI Results 11/15/20   Right ABI:  1.41      Left ABI:  1.41  Right leg systolic / diastolic: 427/06 mmHg Left leg systolic / diastolic: 237/62 mmHg  Arm systolic / diastolic: 831/51 mmHG  Detailed report will be scanned into patient chart.     Diabetic Foot Exam - Simple   Simple Foot Form Diabetic Foot exam was performed with the following findings: Yes 11/15/2020 10:01 AM  Visual Inspection No deformities, no ulcerations, no other skin breakdown bilaterally: Yes Sensation Testing Intact to touch and monofilament testing bilaterally: Yes Pulse Check Posterior Tibialis and Dorsalis pulse intact bilaterally:  Yes Comments       CMP ( most recent) CMP     Component Value Date/Time   NA 141 11/06/2020 0916   K 5.2 11/06/2020 0916   CL 101 11/06/2020 0916   CO2 26 11/06/2020 0916   GLUCOSE 138 (H) 11/06/2020 0916   GLUCOSE 161 (H) 09/02/2020 0451   BUN 11 11/06/2020 0916   CREATININE 0.77 11/06/2020 0916   CALCIUM 9.4 11/06/2020 0916   PROT 6.5 09/02/2020 0451   ALBUMIN 3.2 (L) 09/02/2020 0451   AST 26 09/02/2020 0451   ALT 38 09/02/2020 0451   ALKPHOS 66 09/02/2020 0451   BILITOT 0.9 09/02/2020 0451   GFRNONAA 95 09/23/2020 0000   GFRNONAA >60 09/02/2020 0451     Diabetic Labs (most recent): Lab Results  Component Value Date   HGBA1C 10.5 09/23/2020   HGBA1C 11.7 (H) 08/31/2020     Lipid Panel ( most recent) Lipid Panel     Component Value Date/Time   CHOL 143 08/31/2020 0321   TRIG 281 (H) 08/31/2020 0321   HDL 26 (L) 08/31/2020 0321   CHOLHDL 5.5 08/31/2020 0321   VLDL 56 (H) 08/31/2020 0321   LDLCALC 61 08/31/2020 0321      Lab Results  Component Value Date   TSH 0.787 09/01/2020           Assessment & Plan:   1) Type 2 diabetes mellitus with hyperglycemia, with long-term current use of insulin ([place])  He presents today, accompanied by his wife, with his CGM showing at goal fasting and postprandial glycemic profile.  He was not due for another A1c today.  Analysis of his CGM shows TIR 77%, TAR 23%, TBR 0%.  He is working hard on his diet and exercise.  - DEUNDRA BARD has currently uncontrolled symptomatic type 2 DM since 60 years of age, with most recent A1c of 10.5 %.   -Recent labs reviewed.  - I had a long discussion with him about the progressive nature of diabetes and the pathology behind its complications. -his diabetes is complicated by CVA, CAD and he remains at a high risk for more acute and chronic complications which include CAD, CVA, CKD, retinopathy, and neuropathy. These are all discussed in detail with him.  - Nutritional counseling  repeated at each appointment due to patients tendency to fall back in to old habits.  - The patient admits there is a room for improvement in their diet  and drink choices. -  Suggestion is made for the patient to avoid simple carbohydrates from their diet including Cakes, Sweet Desserts / Pastries, Ice Cream, Soda (diet and regular), Sweet Tea, Candies, Chips, Cookies, Sweet Pastries, Store Bought Juices, Alcohol in Excess of 1-2 drinks a day, Artificial Sweeteners, Coffee Creamer, and "Sugar-free" Products. This will help patient to have stable blood glucose profile and potentially avoid unintended weight gain.   - I encouraged the patient to switch to unprocessed or minimally processed complex starch and increased protein intake (animal or plant source), fruits, and vegetables.   - Patient is advised to stick to a routine mealtimes to eat 3 meals a day and avoid unnecessary snacks (to snack only to correct hypoglycemia).  - he will be scheduled with Jearld Fenton, RDN, CDE for diabetes education.  She has availability to see him today.  - I have approached him with the following individualized plan to manage his diabetes and patient agrees:   -Based on his stable glycemic profile and lack of hypoglycemia, he is advised to continue his Toujeo 25 units SQ nightly and Novolog 4-7 units TID with meals if glucose is above 90 and he is eating (Specific instructions on how to titrate insulin dosage based on glucose readings given to patient in writing).     -he is encouraged to continue monitoring glucose 4 times daily (using his CGM), before meals and before bed, and to call the clinic if he has readings less than 70 or above 300 for 3 tests in a row.  - he is warned not to take insulin without proper monitoring per orders. - Adjustment parameters are given to him for hypo and hyperglycemia in writing.  - he was on Rybelsus and Metformin in the past but was taken off during his hospitalization for  his stroke.  Will keep him off these for now.  - Specific targets for  A1c; LDL, HDL, and Triglycerides were discussed with the patient.  2) Blood Pressure /Hypertension:  his blood pressure is controlled to target.   he is advised to continue his current medications including Coreg 6.25 mg p.o. twice daily, Entresto 24-26 mg po daily, and Spironolactone 25 mg po daily.  3) Lipids/Hyperlipidemia:    Review of his recent lipid panel from 08/31/20 showed controlled LDL at 61 and elevated triglycerides of 281 .  he is advised to continue Lipitor 80 mg daily at bedtime.  Side effects and precautions discussed with him.  4)  Weight/Diet:  his Body mass index is 33.06 kg/m.  -  clearly complicating his diabetes care.   he is a candidate for weight loss. I discussed with him the fact that loss of 5 - 10% of his  current body weight will have the most impact on his diabetes management.  Exercise, and detailed carbohydrates information provided  -  detailed on discharge instructions.  5) Chronic Care/Health Maintenance: -he is on ACEI/ARB and Statin medications and is encouraged to initiate and continue to follow up with Ophthalmology, Dentist, Podiatrist at least yearly or according to recommendations, and advised to stay away from smoking. I have recommended yearly flu vaccine and pneumonia vaccine at least every 5 years; moderate intensity exercise for up to 150 minutes weekly; and sleep for at least 7 hours a day.  - he is advised to maintain close follow up with [place], Virgina Evener, MD for primary care needs, as well as his other providers for optimal and coordinated care.    I  spent 30 minutes in the care of the patient today including review of labs from [facility], Lipids, Thyroid Function, Hematology (current and previous including abstractions from other facilities); face-to-face time discussing  his blood glucose readings/logs, discussing hypoglycemia and hyperglycemia episodes and symptoms, medications  doses, his options of short and long term treatment based on the latest standards of care / guidelines;  discussion about incorporating lifestyle medicine;  and documenting the encounter.    Please refer to Patient Instructions for Blood Glucose Monitoring and Insulin/Medications Dosing Guide"  in media tab for additional information. Please  also refer to " Patient Self Inventory" in the Media  tab for reviewed elements of pertinent patient history.  Tony Combs participated in the discussions, expressed understanding, and voiced agreement with the above plans.  All questions were answered to his satisfaction. he is encouraged to contact clinic should he have any questions or concerns prior to his return visit.     Follow up plan: - Return in about 3 months (around 02/14/2021) for Diabetes F/U with A1c in office, No previsit labs, Bring meter and logs.    Rayetta Pigg, [facility] [address] [place], [place] [address] Phone: [phone] Fax: [phone]  11/15/2020, 9:49 AM

## 2020-11-15 NOTE — Patient Instructions (Signed)

## 2020-11-20 ENCOUNTER — Other Ambulatory Visit (HOSPITAL_COMMUNITY): Payer: BC Managed Care – PPO

## 2020-11-21 ENCOUNTER — Telehealth: Payer: Self-pay | Admitting: Cardiology

## 2020-11-21 NOTE — Telephone Encounter (Signed)
[facility] received forms from [employer] to be completed by Tony Combs. Patient was informed that we have received these forms, and forms were put in providers box on 10/5.

## 2020-11-28 NOTE — Telephone Encounter (Signed)
Called [employer] informing them that patients disability forms would not be able to be completed until closer to end of October due to providers scheduled. She infromed me she could manually extend to 7 days and would send a message to patients case worker to call back to speak with me to see what would could do about extending it a little bit longer than 7 days. W/O call back from [place].  Called patient and spouse to inform them of update. Patient was made aware as soon as the provider was back in the office we would gets forms completed and sent to [employer]. Pt verbalized understanding and let them know that we would keep them updated.

## 2020-12-06 ENCOUNTER — Ambulatory Visit (INDEPENDENT_AMBULATORY_CARE_PROVIDER_SITE_OTHER): Payer: BC Managed Care – PPO | Admitting: Neurology

## 2020-12-06 ENCOUNTER — Encounter: Payer: Self-pay | Admitting: Neurology

## 2020-12-06 VITALS — BP 129/75 | HR 69 | Ht 68.0 in | Wt 212.0 lb

## 2020-12-06 DIAGNOSIS — I6381 Other cerebral infarction due to occlusion or stenosis of small artery: Secondary | ICD-10-CM

## 2020-12-06 DIAGNOSIS — R498 Other voice and resonance disorders: Secondary | ICD-10-CM

## 2020-12-06 DIAGNOSIS — R5383 Other fatigue: Secondary | ICD-10-CM | POA: Diagnosis not present

## 2020-12-06 DIAGNOSIS — G4733 Obstructive sleep apnea (adult) (pediatric): Secondary | ICD-10-CM

## 2020-12-06 NOTE — Patient Instructions (Signed)
I had a long discussion with the patient and his wife regarding his recent lacunar stroke and diagnosis of sleep apnea and discussed results of stroke evaluation and answered questions.  He is also complaining of diarrhea minimal fluctuation in his voice, eye movements and excessive fatigue and tiredness hence would recommend evaluation for myasthenia checking nerve conduction study and acetylcholine receptor antibodies.  Continue aspirin for stroke prevention and maintain aggressive risk factor modification with strict control of diabetes with hemoglobin A1c goal below 6.5%, lipids with LDL cholesterol goal below 70 mg percent and hypertension with blood pressure goal below 130/90.  He was also encouraged to be compliant with using the CPAP every night for his sleep apnea.  He was advised to use his cane at all times.  He will return follow-up in the future as per sleep smart study protocol.  Stroke Prevention Some medical conditions and behaviors are associated with a higher chance of having a stroke. You can help prevent a stroke by making nutrition, lifestyle, and other changes, including managing any medical conditions you may have. What nutrition changes can be made?  Eat healthy foods. You can do this by: Choosing foods high in fiber, such as fresh fruits and vegetables and whole grains. Eating at least 5 or more servings of fruits and vegetables a day. Try to fill half of your plate at each meal with fruits and vegetables. Choosing lean protein foods, such as lean cuts of meat, poultry without skin, fish, tofu, beans, and nuts. Eating low-fat dairy products. Avoiding foods that are high in salt (sodium). This can help lower blood pressure. Avoiding foods that have saturated fat, trans fat, and cholesterol. This can help prevent high cholesterol. Avoiding processed and premade foods. Follow your health care provider's specific guidelines for losing weight, controlling high blood pressure  (hypertension), lowering high cholesterol, and managing diabetes. These may include: Reducing your daily calorie intake. Limiting your daily sodium intake to 1,500 milligrams (mg). Using only healthy fats for cooking, such as olive oil, canola oil, or sunflower oil. Counting your daily carbohydrate intake. What lifestyle changes can be made? Maintain a healthy weight. Talk to your health care provider about your ideal weight. Get at least 30 minutes of moderate physical activity at least 5 days a week. Moderate activity includes brisk walking, biking, and swimming. Do not use any products that contain nicotine or tobacco, such as cigarettes and e-cigarettes. If you need help quitting, ask your health care provider. It may also be helpful to avoid exposure to secondhand smoke. Limit alcohol intake to no more than 1 drink a day for nonpregnant women and 2 drinks a day for men. One drink equals 12 oz of beer, 5 oz of wine, or 1 oz of hard liquor. Stop any illegal drug use. Avoid taking birth control pills. Talk to your health care provider about the risks of taking birth control pills if: You are over 18 years old. You smoke. You get migraines. You have ever had a blood clot. What other changes can be made? Manage your cholesterol levels. Eating a healthy diet is important for preventing high cholesterol. If cholesterol cannot be managed through diet alone, you may also need to take medicines. Take any prescribed medicines to control your cholesterol as told by your health care provider. Manage your diabetes. Eating a healthy diet and exercising regularly are important parts of managing your blood sugar. If your blood sugar cannot be managed through diet and exercise, you may need to  take medicines. Take any prescribed medicines to control your diabetes as told by your health care provider. Control your hypertension. To reduce your risk of stroke, try to keep your blood pressure below  130/80. Eating a healthy diet and exercising regularly are an important part of controlling your blood pressure. If your blood pressure cannot be managed through diet and exercise, you may need to take medicines. Take any prescribed medicines to control hypertension as told by your health care provider. Ask your health care provider if you should monitor your blood pressure at home. Have your blood pressure checked every year, even if your blood pressure is normal. Blood pressure increases with age and some medical conditions. Get evaluated for sleep disorders (sleep apnea). Talk to your health care provider about getting a sleep evaluation if you snore a lot or have excessive sleepiness. Take over-the-counter and prescription medicines only as told by your health care provider. Aspirin or blood thinners (antiplatelets or anticoagulants) may be recommended to reduce your risk of forming blood clots that can lead to stroke. Make sure that any other medical conditions you have, such as atrial fibrillation or atherosclerosis, are managed. What are the warning signs of a stroke? The warning signs of a stroke can be easily remembered as BEFAST. B is for balance. Signs include: Dizziness. Loss of balance or coordination. Sudden trouble walking. E is for eyes. Signs include: A sudden change in vision. Trouble seeing. F is for face. Signs include: Sudden weakness or numbness of the face. The face or eyelid drooping to one side. A is for arms. Signs include: Sudden weakness or numbness of the arm, usually on one side of the body. S is for speech. Signs include: Trouble speaking (aphasia). Trouble understanding. T is for time. These symptoms may represent a serious problem that is an emergency. Do not wait to see if the symptoms will go away. Get medical help right away. Call your local emergency services (911 in the [place] Do not drive yourself to the hospital. Other signs of stroke may include: A  sudden, severe headache with no known cause. Nausea or vomiting. Seizure. Where to find more information For more information, visit: American Stroke Association: www.strokeassociation.org National Stroke Association: www.stroke.org Summary You can prevent a stroke by eating healthy, exercising, not smoking, limiting alcohol intake, and managing any medical conditions you may have. Do not use any products that contain nicotine or tobacco, such as cigarettes and e-cigarettes. If you need help quitting, ask your health care provider. It may also be helpful to avoid exposure to secondhand smoke. Remember BEFAST for warning signs of stroke. Get help right away if you or a loved one has any of these signs. This information is not intended to replace advice given to you by your health care provider. Make sure you discuss any questions you have with your health care provider. Document Revised: 01/16/2017 Document Reviewed: 03/11/2016 Elsevier Patient Education  2021 [employer].

## 2020-12-06 NOTE — Progress Notes (Signed)
[facility] [address] [place]. [place] [address] [phone]       OFFICE FOLLOW-UP NOTE  [name] Date of Birth:  05-23-1960 Medical Record Number:  [id number]   HPI: Mr. Spahr is a 60 year old Caucasian male seen today for initial office follow-up visit following hospital consultation for stroke in July 2022.  History is obtained from the patient and his wife and review of electronic medical records and I personally reviewed pertinent imaging films in PACS.  He has past medical history of hypertension, hyperlipidemia, diabetes, coronary artery disease who presented to [facility] ED on 08/31/2020 for sudden onset of slurred speech with visual blurring and double vision.  Symptoms began the day prior to admission she went to [facility] where his noncontrast CT scan of the head was done which was unremarkable but before seeing the doctor she left [facility].  She presented to [facility] ED the next day with complaints of persistent slurred speech with headache and blurred and double vision.  He also noted right side of the face felt weak and tingling.  He also felt off balance and dizzy at work but denies any focal extremity weakness.  MRI scan of the brain showed a 12 mm acute infarct in the anterior medial right frontal subcortical white matter.  Remote age tiny chronic left thalamic infarct was also noted.  MR angiogram study of the brain showed multifocal stenoses.with mild-to-moderate stenosis within the distal A2 segment of the right anterior cerebral artery. Severe stenosis within the left posterior cerebral artery at the P1/P2 junction. and MRA of the neck was unremarkable.  2D echo showed diminished ejection fraction of 30 to 35% but no clot.  LDL cholesterol 61 mg percent and hemoglobin A1c was elevated at 11.7.  Patient was felt to be at high risk for sleep apnea and offered participation in the sleep smart study for stroke prevention and  agreed to participate.  He was randomized to this CPAP treatment for his sleep apnea.  On exam patient was felt to have dysarthric and hypophonic speech with subtle ptosis of his left eye with left pupil being smaller in size in the right and because of his subjective complaints of diplopia, dysarthria and gait ataxia felt to have a small brainstem infarct not visualized on the MRI in addition to which was seen in the right subcortical region which could not explain his symptoms.  He was discharged on aspirin and Plavix for 3 months followed by aspirin alone.  Patient states that is obtained some improvement but his wife still becomes soft and slurred when he is tired.  Is also has some intermittent swallowing difficulties.  He also complains of persistent blurred and double vision particularly towards the end of the day.  His mornings are much better.  His balance is still not right so uses a cane to walk.  He has had no falls or injuries.  His wife has noted that at the end of the day his eyes are not synchronous and the left eye is does not move as much as the right.  Patient is tolerating aspirin well without bruising or bleeding.  His blood pressure is well controlled today it is 129/75.  His sugars are doing better and hemoglobin A1c is now down to 7.  His average blood glucose for the last 10 days is 142.  He remains on Lipitor which is tolerating well without side effects.  He has been compliant with  using his CPAP mask which he got through the sleep smart study and feels it is helping him.  ROS:   14 system review of systems is positive for blurred vision, double vision, soft voice, dysphagia, tiredness, fatigue, imbalance of gait and all other systems negative  PMH:  Past Medical History:  Diagnosis Date   Blockage of coronary artery of heart (HCC)    Diabetes mellitus without complication ([place])    Hyperlipidemia    Hypertension    Stroke [facility])     Social History:  Social History    Socioeconomic History   Marital status: Married    Spouse name: Not on file   Number of children: Not on file   Years of education: Not on file   Highest education level: Not on file  Occupational History   Not on file  Tobacco Use   Smoking status: Never   Smokeless tobacco: Never  Vaping Use   Vaping Use: Never used  Substance and Sexual Activity   Alcohol use: Not Currently   Drug use: Not Currently   Sexual activity: Not Currently  Other Topics Concern   Not on file  Social History Narrative   Not on file   Social Determinants of Health   Financial Resource Strain: Not on file  Food Insecurity: Not on file  Transportation Needs: Not on file  Physical Activity: Not on file  Stress: Not on file  Social Connections: Not on file  Intimate Partner Violence: Not on file    Medications:   Current Outpatient Medications on File Prior to Visit  Medication Sig Dispense Refill   aspirin EC 81 MG tablet Take 81 mg by mouth daily. Swallow whole.     B-D UF III MINI PEN NEEDLES 31G X 5 MM MISC SMARTSIG:1 Pre-Filled Pen Syringe SUB-Q 4 Times Daily     blood glucose meter kit and supplies Dispense based on patient and insurance preference. Use up to four times daily as directed. (FOR ICD-10 E10.9, E11.9). 1 each 0   clopidogrel (PLAVIX) 75 MG tablet Take 1 tablet (75 mg total) by mouth daily. 120 tablet 0   Insulin Aspart PenFill 100 UNIT/ML SOCT Inject 4-7 Units into the skin 3 (three) times daily before meals. 3 mL 0   insulin glargine, 1 Unit Dial, (TOUJEO) 300 UNIT/ML Solostar Pen Inject 25 Units into the skin at bedtime. 1.5 mL 0   Insulin Pen Needle (PEN NEEDLES) 30G X 5 MM MISC 1 pen by Does not apply route 4 (four) times daily - after meals and at bedtime. 200 each 1   sacubitril-valsartan (ENTRESTO) 49-51 MG Take 1 tablet by mouth 2 (two) times daily. 60 tablet 5   atorvastatin (LIPITOR) 80 MG tablet Take 1 tablet (80 mg total) by mouth daily. 30 tablet 0   carvedilol  (COREG) 6.25 MG tablet Take 1 tablet (6.25 mg total) by mouth 2 (two) times daily with a meal. 60 tablet 0   spironolactone (ALDACTONE) 25 MG tablet Take 1 tablet (25 mg total) by mouth daily. 30 tablet 0   No current facility-administered medications on file prior to visit.    Allergies:  No Known Allergies  Physical Exam General: Obese middle-aged Caucasian male, seated, in no evident distress Head: head normocephalic and atraumatic.  Neck: supple with no carotid or supraclavicular bruits Cardiovascular: regular rate and rhythm, no murmurs Musculoskeletal: no deformity Skin:  no rash/petichiae Vascular:  Normal pulses all extremities Vitals:   12/06/20 0948  BP: 129/75  Pulse: 69   Neurologic Exam Mental Status: Awake and fully alert. Oriented to place and time. Recent and remote memory intact. Attention span, concentration and fund of knowledge appropriate. Mood and affect appropriate.  Cranial Nerves: Fundoscopic exam reveals sharp disc margins. Pupils equal, briskly reactive to light. Extraocular movements full without nystagmus. Visual fields full to confrontation. Hearing intact. Facial sensation intact. Face, tongue, palate moves normally and symmetrically.  Mild drooping of both eyelids on sustained upgaze with subjective diplopia but no ophthalmoplegia. Motor: Normal bulk and tone. Normal strength in all tested extremity muscles. Sensory.: intact to touch ,pinprick .position and vibratory sensation.  Coordination: Rapid alternating movements normal in all extremities. Finger-to-nose and heel-to-shin performed accurately bilaterally. Gait and Station: Arises from chair without difficulty. Stance is normal. Gait demonstrates normal stride length and balance .  Unable to heel, toe and tandem walk without difficulty.  Reflexes: 1+ and symmetric. Toes downgoing.   NIHSS  0 Modified Rankin  2   ASSESSMENT: 60 year old Caucasian male with sudden onset of slurred speech, diplopia  and dysphagia in July 2022 likely from small brainstem infarct not visualized on MRI from small vessel disease.  MRI at that time had shown also right periventricular white matter infarct which is probably silent.  Multiple vascular risk factors of hypertension, diabetes, hyperlipidemia obesity and sleep apnea.  Patient has been randomized to the CPAP treatment arm of the sleep smart study.  He is having new symptoms of increased fatigability tiredness and worsening speech and swallowing and may need further evaluation for myasthenia     PLAN: I had a long discussion with the patient and his wife regarding his recent lacunar stroke and diagnosis of sleep apnea and discussed results of stroke evaluation and answered questions.  He is also complaining of diarrhea minimal fluctuation in his voice, eye movements and excessive fatigue and tiredness hence would recommend evaluation for myasthenia checking nerve conduction study and acetylcholine receptor antibodies.  Continue aspirin for stroke prevention and maintain aggressive risk factor modification with strict control of diabetes with hemoglobin A1c goal below 6.5%, lipids with LDL cholesterol goal below 70 mg percent and hypertension with blood pressure goal below 130/90.  He was also encouraged to be compliant with using the CPAP every night for his sleep apnea.  He was advised to use his cane at all times.  He will return follow-up in the future as per sleep smart study protocol.Greater than 50% of time during this 35 minute visit was spent on counseling,explanation of diagnosis, planning of further management, discussion with patient and family and coordination of care Antony Contras, MD Note: This document was prepared with digital dictation and possible smart phrase technology. Any transcriptional errors that result from this process are unintentional

## 2020-12-11 LAB — ACETYLCHOLINE RECEPTOR AB, ALL
AChR Binding Ab, Serum: 3.62 nmol/L — ABNORMAL HIGH (ref 0.00–0.24)
Acetylchol Block Ab: 95 % — ABNORMAL HIGH (ref 0–25)

## 2020-12-18 ENCOUNTER — Telehealth: Payer: Self-pay | Admitting: *Deleted

## 2020-12-18 NOTE — Telephone Encounter (Signed)
Patient's wife returned call. 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 He continues to have difficulty walking and most often has to use a cane, he becomes very easily fatigued, difficulty eating, and has double vision in his right eye.   She reports that he is scheduled to see his cardiologist at the end of the month.   Patient's wife feels that he needs a minimum of about 4 months before he can return to partial duties. Dr. Pearlean Brownie, do you agree with this or will you need to re-assess at his follow up on 02/26/21?

## 2020-12-18 NOTE — Telephone Encounter (Signed)
Received [employer] paperwork. We need to discuss several questions on the form. Left message for the patient to return the call. We can also speak to his wife, Jaynie Crumble, on [place].

## 2020-12-20 ENCOUNTER — Other Ambulatory Visit: Payer: Self-pay | Admitting: Neurology

## 2020-12-20 MED ORDER — PYRIDOSTIGMINE BROMIDE 60 MG PO TABS: 30.0000 mg | ORAL_TABLET | Freq: Three times a day (TID) | ORAL | 1 refills | Status: DC

## 2020-12-20 NOTE — Progress Notes (Signed)
I called and spoke to the patient and his daughter and gave him results of the acetylcholine receptor antibodies test being abnormal suggesting he may have myasthenia gravis.  I recommend he start taking Mestinon 30 mg 3 times daily for a week increase if tolerated without side effects to 60 mg 1 tablet 3 times daily.  He was advised to keep his upcoming appointment for nerve conductions rapid repetitive stimulation study in office.  He was asked to call back if he had further questions.  He voiced understanding

## 2020-12-21 ENCOUNTER — Other Ambulatory Visit: Payer: Self-pay

## 2020-12-21 ENCOUNTER — Ambulatory Visit (HOSPITAL_COMMUNITY): Payer: BC Managed Care – PPO | Attending: Internal Medicine

## 2020-12-21 DIAGNOSIS — I10 Essential (primary) hypertension: Secondary | ICD-10-CM

## 2020-12-21 DIAGNOSIS — I519 Heart disease, unspecified: Secondary | ICD-10-CM | POA: Diagnosis not present

## 2020-12-21 LAB — ECHOCARDIOGRAM COMPLETE
Area-P 1/2: 3.45 cm2
S' Lateral: 3.7 cm

## 2020-12-21 MED ORDER — PERFLUTREN LIPID MICROSPHERE
1.0000 mL | INTRAVENOUS | Status: AC | PRN
  Administered 2020-12-21: 3 mL via INTRAVENOUS

## 2020-12-24 NOTE — Telephone Encounter (Signed)
I left a voicemail for his wife letting her know the date the paperwork was faxed from our office.

## 2020-12-24 NOTE — Telephone Encounter (Signed)
Pt's wife Jaynie Crumble called states she was returning a  call to [facility] to answer some questions she had for her. [name] requesting a call back.

## 2020-12-24 NOTE — Telephone Encounter (Signed)
I spoke to the patient's wife. She wanted to know what date the [employer] paperwork was faxed back to the company. She would like a call back with this information. If no answer, okay to leave a message.

## 2021-01-08 NOTE — Telephone Encounter (Signed)
Received forms back from provider, contacted patient and spouse about forms fee. Patient will try to come into office on 11/23 to pay forms fee and pick up copy of forms.

## 2021-01-08 NOTE — Telephone Encounter (Signed)
Forms were put in Dr.Harding (primary) box on 10/5. Message sent to nurse 11/22 for update on forms.

## 2021-01-09 DIAGNOSIS — Z0279 Encounter for issue of other medical certificate: Secondary | ICD-10-CM

## 2021-01-09 NOTE — Telephone Encounter (Signed)
Patient paid $29 forms fee. Forms faxed to [facility]. E-mail sent to billing for payment to be posted to patient account.

## 2021-01-11 ENCOUNTER — Other Ambulatory Visit: Payer: Self-pay | Admitting: Neurology

## 2021-01-17 ENCOUNTER — Encounter: Payer: Self-pay | Admitting: Cardiology

## 2021-01-17 ENCOUNTER — Other Ambulatory Visit: Payer: Self-pay

## 2021-01-17 ENCOUNTER — Ambulatory Visit (INDEPENDENT_AMBULATORY_CARE_PROVIDER_SITE_OTHER): Payer: BC Managed Care – PPO | Admitting: Cardiology

## 2021-01-17 VITALS — BP 130/78 | HR 79 | Resp 20 | Ht 68.0 in | Wt 224.2 lb

## 2021-01-17 DIAGNOSIS — E1169 Type 2 diabetes mellitus with other specified complication: Secondary | ICD-10-CM | POA: Diagnosis not present

## 2021-01-17 DIAGNOSIS — Q2112 Patent foramen ovale: Secondary | ICD-10-CM | POA: Diagnosis not present

## 2021-01-17 DIAGNOSIS — E875 Hyperkalemia: Secondary | ICD-10-CM | POA: Insufficient documentation

## 2021-01-17 DIAGNOSIS — E785 Hyperlipidemia, unspecified: Secondary | ICD-10-CM

## 2021-01-17 DIAGNOSIS — I639 Cerebral infarction, unspecified: Secondary | ICD-10-CM

## 2021-01-17 DIAGNOSIS — I420 Dilated cardiomyopathy: Secondary | ICD-10-CM | POA: Diagnosis not present

## 2021-01-17 DIAGNOSIS — I10 Essential (primary) hypertension: Secondary | ICD-10-CM

## 2021-01-17 HISTORY — DX: Dilated cardiomyopathy: I42.0

## 2021-01-17 MED ORDER — METOPROLOL TARTRATE 50 MG PO TABS: 100.0000 mg | ORAL_TABLET | Freq: Once | ORAL | 0 refills | Status: DC

## 2021-01-17 NOTE — Progress Notes (Signed)
Primary Care Provider: Curlene Labrum, MD [facility] Cardiologist: [name], MD Electrophysiologist: None  Clinic Note: Chief Complaint  Patient presents with   Follow-up   Cardiomyopathy    Mostly asymptomatic    ===================================  ASSESSMENT/PLAN   Problem List Items Addressed This Visit       Cardiology Problems   Dilated cardiomyopathy ([place]) (Chronic)    Unclear etiology.  Plan when he was in the hospital was to pursue an ischemic evaluation with coronary CTA once discharged.  This never happened.  With global hypokinesis unlikely to be ischemic, however need to exclude reversible causes.  NYHA Class I-II symptoms, euvolemic on exam.  Plan: Check Coronary CTA with FFRCT if necessary -> to exclude ischemic CAD (CMP will be checked) For now continue current doses of Entresto, carvedilol and spironolactone.  (Today's potassium was 4.6.) Not requiring diuretic.      Relevant Orders   ECHOCARDIOGRAM LIMITED   Basic metabolic panel (Completed)   CT CORONARY MORPH W/CTA COR W/SCORE W/CA W/CM &/OR WO/CM   Acute CVA (cerebrovascular accident) ([place]) (Chronic)    Still on aspirin, high-dose statin. Questionable PFO on recent echo.  Checking bubble study now.      Essential hypertension (Chronic)    Blood pressure pretty well controlled on carvedilol and Entresto at current doses.  Anticipate titrating carvedilol up to 12 and half TID, but he is noting elevated fatigue some leery of pushing is too far.  We also the potential of further increasing Entresto, provided.  The potassium levels are okay.  On carvedilol 625 mg daily, Entresto 49 to 51 mg daily and spironolactone 25 mg daily.  If potassium is elevated, would probably need to reduce spironolactone. Marland Kitchen      PFO (patent foramen ovale) (Chronic)    Most recent echo suggested possible PFO.  EXTR had a stroke, need to clarify.  Plan: We will order Limited Echo with Bubble Study. For now  continue aspirin -> no longer on Plavix.      Relevant Orders   ECHOCARDIOGRAM LIMITED   Hyperlipidemia associated with type 2 diabetes mellitus ([place]) (Chronic)    I suspect to be LDL checked recently is probably at least as good if not better than it was in July.  Total cholesterol has come down as have triglycerides.  This likely is combination of being on statin and having more aggressive glycemic control.  For now we will continue 80 mg of Lipitor, but anticipate potentially being on reduced dose of his lipids continue to progress.        Other   Hyperkalemia (Chronic)   Relevant Orders   Basic metabolic panel (Completed)   ===================================  HPI:    Tony Combs is a 60 y.o. male with a PMH of DM 2 (uncontrolled), HTN (poorly controlled), likely OSA with plans for CPAP and recent CVA (08/25/2018) and dilated cardiomyopathy (EF 30 to 35% with global HK) who is being seen today for .  Recent Hospitalizations:  7/14-18/2022: Admitted with Acute Right Frontal Lobe CVA with dysarthria and blurred vision..  2D echo showed EF 30-35 with global HK.  He was started on carvedilol and Entresto.  Was also discharged on on aspirin and Plavix along with statin along for CVA.Marland Kitchen  Tony Combs was last seen on 11/06/2020 by Almyra Deforest, PA.  By that time his A1c had dropped down to 7.  Entresto increased to 49-51 mg twice daily.  Spironolactone not added because of borderline  hyperkalemia.  Was asymptomatic from a CHF standpoint. Plan was for recheck echo in 2 months.     Reviewed  CV studies:    The following studies were reviewed today: (if available, images/films reviewed: From Epic Chart or Care Everywhere) TTE 08/31/2020: EF 30 and 35%.  Mildly decreased function.  Moderate LVH.  Unable to assess diastolic function.  Unable assess RV function.  Mild LA dilation. TTE 12/21/2020: EF remains 30 to 35% with moderately reduced function.  Global HK.  GR 1 DD.  Normal RV size and  function.  Normal aortic valves.  Cannot exclude small PFO.   Interval History:   Tony Combs returns today overall pretty stable.  He is limited more by unsteady gait and fatigue.  Not necessarily exertional dyspnea.  He sleeps on 2-3 pillows but really does not have any significant orthopnea to this is his baseline.  No PND.  Mild end of day edema that goes down with elevation.  No significant exertional dyspnea.  He does have some orthostatic dizziness has not had any syncope or near syncope.  He has baseline ocular changes and stroke.  He has started on CPAP for OSA.  He indicated that he did have a relatively long bout with COVID and post-COVID symptoms dating back to the end of last year into January 2022, but really had no other significant illnesses that could potentially explain his reduced EF.  He also denied any real prolonged chest pain or pressure either before or after his stroke.  He does have some intermittent mild chest twinges.  He denies any rapid irregular heartbeats palpitations.  No recurrent TIA or amaurosis fugax symptoms.  No syncope or near syncope.   REVIEWED OF SYSTEMS   Review of Systems  Constitutional:  Positive for malaise/fatigue. Negative for weight loss.  HENT:  Negative for congestion and nosebleeds.   Respiratory:  Negative for cough and shortness of breath.   Cardiovascular:  Positive for leg swelling.  Gastrointestinal:  Negative for blood in stool and melena.  Genitourinary:  Negative for hematuria.  Musculoskeletal:  Negative for joint pain.  Neurological:  Positive for dizziness (Orthostatic dizziness) and weakness (Legs are somewhat weak from stroke). Negative for focal weakness.       Unsteady gait.  Endo/Heme/Allergies:  Does not bruise/bleed easily.  Psychiatric/Behavioral:  Negative for depression and memory loss. The patient is not nervous/anxious and does not have insomnia.        Seems to have some symptoms suggestive of dysthymia.    I have reviewed and (if needed) personally updated the patient's problem list, medications, allergies, past medical and surgical history, social and family history.   PAST MEDICAL HISTORY   Past Medical History:  Diagnosis Date   Diabetes mellitus without complication ([place])    Dilated cardiomyopathy ([place]) 01/17/2021   In setting of stroke: TTE 08/31/2020: EF 30 and 35%.  Mildly decreased function.  Moderate LVH.  Unable to assess diastolic function.  Unable assess RV function.  Mild LA dilation.   Hyperlipidemia    Hypertension    Stroke ([place]) 08/30/2020   Right frontal lobe stroke with dysarthria and blurred vision.    PAST SURGICAL HISTORY   Past Surgical History:  Procedure Laterality Date   HERNIA REPAIR     TRANSTHORACIC ECHOCARDIOGRAM  08/31/2020   a) EF 30 and 35%.  Mildly decreased function.  Moderate LVH.  Unable to assess diastolic function.  Unable assess RV function.  Mild LA dilation.; b) 12/21/2020:  EF remains 30 to 35% with moderately reduced function.  Global HK.  GR 1 DD.  Normal RV size and function.  Normal aortic valves.  Cannot exclude small PFO.     There is no immunization history on file for this patient.  MEDICATIONS/ALLERGIES   Current Meds  Medication Sig   aspirin EC 81 MG tablet Take 81 mg by mouth daily. Swallow whole.   B-D UF III MINI PEN NEEDLES 31G X 5 MM MISC SMARTSIG:1 Pre-Filled Pen Syringe SUB-Q 4 Times Daily   blood glucose meter kit and supplies Dispense based on patient and insurance preference. Use up to four times daily as directed. (FOR ICD-10 E10.9, E11.9).   Insulin Aspart PenFill 100 UNIT/ML SOCT Inject 4-7 Units into the skin 3 (three) times daily before meals.   insulin glargine, 1 Unit Dial, (TOUJEO) 300 UNIT/ML Solostar Pen Inject 25 Units into the skin at bedtime.   Insulin Pen Needle (PEN NEEDLES) 30G X 5 MM MISC 1 pen by Does not apply route 4 (four) times daily - after meals and at bedtime.   metoprolol tartrate (LOPRESSOR)  50 MG tablet Take 2 tablets (100 mg total) by mouth once for 1 dose. TAKE TWO HOURS PRIOR TO  SCHEDULE CARDIAC TEST   pyridostigmine (MESTINON) 60 MG tablet Take 0.5 tablets (30 mg total) by mouth 3 (three) times daily. Take 0.5 tablets 3 times daily for 1 week and then 1 tablet 3 times daily if tolerated   sacubitril-valsartan (ENTRESTO) 49-51 MG Take 1 tablet by mouth 2 (two) times daily.    No Known Allergies  SOCIAL HISTORY/FAMILY HISTORY   Reviewed in Epic:   Social History   Tobacco Use   Smoking status: Never   Smokeless tobacco: Never  Vaping Use   Vaping Use: Never used  Substance Use Topics   Alcohol use: Not Currently   Drug use: Not Currently   Social History   Social History Narrative   Not on file   Family History  Problem Relation Age of Onset   Heart failure Father 57   Heart attack Father 71   Hypertension Father    Diabetes type II Father    Coronary artery disease Father 61   Father had MI at age 69 and died from CHF in his 2s.  OBJCTIVE -PE, EKG, labs   Wt Readings from Last 3 Encounters:  01/17/21 224 lb 3.2 oz (101.7 kg)  12/06/20 212 lb (96.2 kg)  11/15/20 217 lb 6.4 oz (98.6 kg)    Physical Exam: BP 130/78 (BP Location: Left Arm, Patient Position: Sitting, Cuff Size: Normal)   Pulse 79   Resp 20   Ht _0  (1.727 m)   Wt 224 lb 3.2 oz (101.7 kg)   SpO2 96%   BMI 34.09 kg/m  Physical Exam Constitutional:      General: He is not in acute distress.    Appearance: Normal appearance. He is obese. He is not ill-appearing or toxic-appearing.     Comments: Well-nourished, well-groomed.  HENT:     Head: Normocephalic and atraumatic.  Cardiovascular:     Rate and Rhythm: Normal rate and regular rhythm.     Pulses: Normal pulses.     Heart sounds: Normal heart sounds. No murmur heard.   No friction rub. No gallop.  Pulmonary:     Effort: Pulmonary effort is normal. No respiratory distress.     Breath sounds: Normal breath sounds.   Chest:     Chest  wall: No tenderness.  Musculoskeletal:        General: Swelling (Trivial ankle) present.     Cervical back: Normal range of motion and neck supple.  Skin:    General: Skin is warm.  Neurological:     General: No focal deficit present.     Mental Status: He is alert and oriented to person, place, and time.     Gait: Gait abnormal.  Psychiatric:        Behavior: Behavior normal.        Thought Content: Thought content normal.        Judgment: Judgment normal.     Comments: Somewhat blunted affect but otherwise normal mood     Adult ECG Report N/a  Recent Labs: Labs reviewed.  He did have lipid panel checked by PCP on 12/17/2020.   TC 97, TG 151, HDL 26, LDL ?; A1c 7.1.  Cr 0.69.  K+ not reported. Unfortunately [facility] does not provide the entire lipid panel and all labs.  (We will try to get labs.)  LDL in July was 61.  Lab Results  Component Value Date   CHOL 143 08/31/2020   HDL 26 (L) 08/31/2020   LDLCALC 61 08/31/2020   TRIG 281 (H) 08/31/2020   CHOLHDL 5.5 08/31/2020   Labs checked today Lab Results  Component Value Date   CREATININE 0.74 (L) 01/17/2021   BUN 12 01/17/2021   NA 137 01/17/2021   K 4.6 01/17/2021   CL 98 01/17/2021   CO2 25 01/17/2021   CBC Latest Ref Rng & Units 09/02/2020 09/01/2020 08/31/2020  WBC 4.0 - 10.5 K/uL 8.2 7.5 7.6  Hemoglobin 13.0 - 17.0 g/dL 15.2 14.2 15.3  Hematocrit 39.0 - 52.0 % 45.4 44.5 46.2  Platelets 150 - 400 K/uL 202 229 219    Lab Results  Component Value Date   HGBA1C 10.5 09/22/2020   Lab Results  Component Value Date   TSH 0.787 09/01/2020    ==================================================  COVID-19 Education: The signs and symptoms of COVID-19 were discussed with the patient and how to seek care for testing (follow up with PCP or arrange E-visit).    I spent a total of 40 minutes with the patient spent in direct patient consultation.  Additional time spent with chart review  / charting  (studies, outside notes, etc): 17 min Total Time: 57 min  Current medicines are reviewed at length with the patient today.  (+/- concerns) none  This visit occurred during the SARS-CoV-2 public health emergency.  Safety protocols were in place, including screening questions prior to the visit, additional usage of staff PPE, and extensive cleaning of exam room while observing appropriate contact time as indicated for disinfecting solutions.  Notice: This dictation was prepared with Dragon dictation along with smart phrase technology. Any transcriptional errors that result from this process are unintentional and may not be corrected upon review.   Studies Ordered:  Orders Placed This Encounter  Procedures   CT CORONARY MORPH W/CTA COR W/SCORE W/CA W/CM &/OR WO/CM   Basic metabolic panel   ECHOCARDIOGRAM LIMITED     Patient Instructions / Medication Changes & Studies & Tests Ordered   Patient Instructions  Medication Instructions:   No changes  *If you need a refill on your cardiac medications before your next appointment, please call your pharmacy*   Lab Work: BMP If you have labs (blood work) drawn today and your tests are completely normal, you will receive your results only by:  MyChart Message (if you have MyChart) OR A paper copy in the mail If you have any lab test that is abnormal or we need to change your treatment, we will call you to review the results.   Testing/Procedures: Will be schedule at [facility] has requested that you have an limited echocardiogram. ( BUBBLE STUDY ONLY) Echocardiography is a painless test that uses sound waves to create images of your heart. It provides your doctor with information about the size and shape of your heart and how well your heart's chambers and valves are working. This procedure takes approximately one hour. There are no restrictions for this procedure.  And Will be schedule once  insurance  authorization is obtained - [facility]  at [place] has requested that you have cardiac CTA. Cardiac computed tomography (CT) is a painless test that uses an x-ray machine to take clear, detailed pictures of your heart.  Please follow instruction sheet as given.     Follow-Up: At [facility], you and your health needs are our priority.  As part of our continuing mission to provide you with exceptional heart care, we have created designated Provider Care Teams.  These Care Teams include your primary Cardiologist (physician) and Advanced Practice Providers (APPs -  Physician Assistants and Nurse Practitioners) who all work together to provide you with the care you need, when you need it.  We recommend signing up for the patient portal called "MyChart".  Sign up information is provided on this After Visit Summary.  MyChart is used to connect with patients for Virtual Visits (Telemedicine).  Patients are able to view lab/test results, encounter notes, upcoming appointments, etc.  Non-urgent messages can be sent to your provider as well.   To learn more about what you can do with MyChart, go to [email or web address].    Your next appointment:   3 month(s)  The format for your next appointment:   In Person  Provider:   [name], MD     Other Instructions   Your cardiac CT will be scheduled at  the below location:   [facility] [address] [place], [place] [address] [phone]   If scheduled at [facility], please arrive at the [facility] main entrance (entrance A) of [facility] 30 minutes prior to test start time. You can use the FREE valet parking offered at the main entrance (encouraged to control the heart rate for the test) Proceed to the [facility] Radiology Department (first floor) to check-in and test prep.    Please follow these instructions carefully (unless otherwise directed):  Hold all  erectile dysfunction medications at least 3 days (72 hrs) prior to test.    Please have lab done at least one week prior to test .   On the Night Before the Test: Be sure to Drink plenty of water. Do not consume any caffeinated/decaffeinated beverages or chocolate 12 hours prior to your test. Do not take any antihistamines 12 hours prior to your test.   On the Day of the Test: Drink plenty of water until 1 hour prior to the test. Do not eat any food 4 hours prior to the test. You may take your regular medications prior to the test.  Take metoprolol (Lopressor) 100 mg  two hours prior to test.  ( Do not take your Carvedilol this morning)         After the  Test: Drink plenty of water. After receiving IV contrast, you may experience a mild flushed feeling. This is normal. On occasion, you may experience a mild rash up to 24 hours after the test. This is not dangerous. If this occurs, you can take Benadryl 25 mg and increase your fluid intake. If you experience trouble breathing, this can be serious. If it is severe call 911 IMMEDIATELY. If it is mild, please call our office. If you take any of these medications: Glipizide/Metformin, Avandament, Glucavance, please do not take 48 hours after completing test unless otherwise instructed.  Please allow 2-4 weeks for scheduling of routine cardiac CTs. Some insurance companies require a pre-authorization which may delay scheduling of this test.   For non-scheduling related questions, please contact the cardiac imaging nurse navigator should you have any questions/concerns: Marchia Bond, Cardiac Imaging Nurse Navigator Gordy Clement, Cardiac Imaging Nurse Navigator [facility] Direct Office Dial: [phone]   For scheduling needs, including cancellations and rescheduling, please call [place], [phone].      [name], M.D., M.S. Interventional Cardiologist   Pager # [phone] Phone #  [phone] [address] [place], [place] [address]   Thank you for choosing Heartcare at [facility]!!

## 2021-01-17 NOTE — Patient Instructions (Signed)
Medication Instructions:   No changes  *If you need a refill on your cardiac medications before your next appointment, please call your pharmacy*   Lab Work: BMP If you have labs (blood work) drawn today and your tests are completely normal, you will receive your results only by: MyChart Message (if you have MyChart) OR A paper copy in the mail If you have any lab test that is abnormal or we need to change your treatment, we will call you to review the results.   Testing/Procedures: Will be schedule at [facility] [address] Your physician has requested that you have an limited echocardiogram. ( BUBBLE STUDY ONLY) Echocardiography is a painless test that uses sound waves to create images of your heart. It provides your doctor with information about the size and shape of your heart and how well your heart's chambers and valves are working. This procedure takes approximately one hour. There are no restrictions for this procedure.  And Will be schedule once  insurance authorization is obtained - [facility]  at [address] [facility] [address]  Your physician has requested that you have cardiac CTA. Cardiac computed tomography (CT) is a painless test that uses an x-ray machine to take clear, detailed pictures of your heart.  Please follow instruction sheet as given.     Follow-Up: At [facility], you and your health needs are our priority.  As part of our continuing mission to provide you with exceptional heart care, we have created designated Provider Care Teams.  These Care Teams include your primary Cardiologist (physician) and Advanced Practice Providers (APPs -  Physician Assistants and Nurse Practitioners) who all work together to provide you with the care you need, when you need it.  We recommend signing up for the patient portal called "MyChart".  Sign up information is provided on this After Visit Summary.  MyChart is used to connect with patients for Virtual Visits  (Telemedicine).  Patients are able to view lab/test results, encounter notes, upcoming appointments, etc.  Non-urgent messages can be sent to your provider as well.   To learn more about what you can do with MyChart, go to [email or web address].    Your next appointment:   3 month(s)  The format for your next appointment:   In Person  Provider:   Bryan Lemma, MD     Other Instructions   Your cardiac CT will be scheduled at  the below location:   [facility] [address] [place], [place] [address] [phone]   If scheduled at [facility], please arrive at the [facility] main entrance (entrance A) of [facility] 30 minutes prior to test start time. You can use the FREE valet parking offered at the main entrance (encouraged to control the heart rate for the test) Proceed to the [facility] Radiology Department (first floor) to check-in and test prep.    Please follow these instructions carefully (unless otherwise directed):  Hold all erectile dysfunction medications at least 3 days (72 hrs) prior to test.    Please have lab done at least one week prior to test .   On the Night Before the Test: Be sure to Drink plenty of water. Do not consume any caffeinated/decaffeinated beverages or chocolate 12 hours prior to your test. Do not take any antihistamines 12 hours prior to your test.   On the Day of the Test: Drink plenty of water until 1 hour prior to the test. Do not eat  any food 4 hours prior to the test. You may take your regular medications prior to the test.  Take metoprolol (Lopressor) 100 mg  two hours prior to test.  ( Do not take your Carvedilol this morning)         After the Test: Drink plenty of water. After receiving IV contrast, you may experience a mild flushed feeling. This is normal. On occasion, you may experience a mild rash up to 24 hours after the test. This is not dangerous. If this occurs, you can take  Benadryl 25 mg and increase your fluid intake. If you experience trouble breathing, this can be serious. If it is severe call 911 IMMEDIATELY. If it is mild, please call our office. If you take any of these medications: Glipizide/Metformin, Avandament, Glucavance, please do not take 48 hours after completing test unless otherwise instructed.  Please allow 2-4 weeks for scheduling of routine cardiac CTs. Some insurance companies require a pre-authorization which may delay scheduling of this test.   For non-scheduling related questions, please contact the cardiac imaging nurse navigator should you have any questions/concerns: Rockwell Alexandria, Cardiac Imaging Nurse Navigator Larey Brick, Cardiac Imaging Nurse Navigator [facility] Direct Office Dial: [phone]   For scheduling needs, including cancellations and rescheduling, please call [place], [phone].

## 2021-01-18 LAB — BASIC METABOLIC PANEL
BUN/Creatinine Ratio: 16 (ref 10–24)
BUN: 12 mg/dL (ref 8–27)
CO2: 25 mmol/L (ref 20–29)
Calcium: 9 mg/dL (ref 8.6–10.2)
Chloride: 98 mmol/L (ref 96–106)
Creatinine, Ser: 0.74 mg/dL — ABNORMAL LOW (ref 0.76–1.27)
Glucose: 127 mg/dL — ABNORMAL HIGH (ref 70–99)
Potassium: 4.6 mmol/L (ref 3.5–5.2)
Sodium: 137 mmol/L (ref 134–144)
eGFR: 102 mL/min/{1.73_m2} (ref >59)

## 2021-01-20 ENCOUNTER — Encounter: Payer: Self-pay | Admitting: Cardiology

## 2021-01-20 NOTE — Assessment & Plan Note (Addendum)
Most recent echo suggested possible PFO.  EXTR had a stroke, need to clarify.  Plan: We will order Limited Echo with Bubble Study. For now continue aspirin -> no longer on Plavix.

## 2021-01-20 NOTE — Assessment & Plan Note (Signed)
Blood pressure pretty well controlled on carvedilol and Entresto at current doses.  Anticipate titrating carvedilol up to 12 and half TID, but he is noting elevated fatigue some leery of pushing is too far.  We also the potential of further increasing Entresto, provided.  The potassium levels are okay.  On carvedilol 625 mg daily, Entresto 49 to 51 mg daily and spironolactone 25 mg daily.  If potassium is elevated, would probably need to reduce spironolactone. Marland Kitchen

## 2021-01-20 NOTE — Assessment & Plan Note (Signed)
I suspect to be LDL checked recently is probably at least as good if not better than it was in July.  Total cholesterol has come down as have triglycerides.  This likely is combination of being on statin and having more aggressive glycemic control.  For now we will continue 80 mg of Lipitor, but anticipate potentially being on reduced dose of his lipids continue to progress.

## 2021-01-20 NOTE — Assessment & Plan Note (Addendum)
Unclear etiology.  Plan when he was in the hospital was to pursue an ischemic evaluation with coronary CTA once discharged.  This never happened.  With global hypokinesis unlikely to be ischemic, however need to exclude reversible causes.  NYHA Class I-II symptoms, euvolemic on exam.  Plan: Check Coronary CTA with FFRCT if necessary -> to exclude ischemic CAD (CMP will be checked)  For now continue current doses of Entresto, carvedilol and spironolactone.  (Today's potassium was 4.6.)  Not requiring diuretic.

## 2021-01-20 NOTE — Assessment & Plan Note (Signed)
Still on aspirin, high-dose statin. Questionable PFO on recent echo.  Checking bubble study now.

## 2021-01-24 ENCOUNTER — Ambulatory Visit (INDEPENDENT_AMBULATORY_CARE_PROVIDER_SITE_OTHER): Payer: BC Managed Care – PPO | Admitting: Diagnostic Neuroimaging

## 2021-01-24 ENCOUNTER — Encounter (INDEPENDENT_AMBULATORY_CARE_PROVIDER_SITE_OTHER): Payer: BC Managed Care – PPO | Admitting: Diagnostic Neuroimaging

## 2021-01-24 ENCOUNTER — Other Ambulatory Visit: Payer: Self-pay

## 2021-01-24 DIAGNOSIS — Z0289 Encounter for other administrative examinations: Secondary | ICD-10-CM

## 2021-01-24 DIAGNOSIS — R5383 Other fatigue: Secondary | ICD-10-CM

## 2021-01-24 NOTE — Procedures (Signed)
[facility]  NCS (NERVE CONDUCTION STUDY) WITH EMG (ELECTROMYOGRAPHY) REPORT   STUDY DATE: 01/24/21 PATIENT NAME: Tony Combs DOB: [birth year] MRN: [id number]  ORDERING CLINICIAN: Delia Heady, MD   TECHNOLOGIST: Durenda Age ELECTROMYOGRAPHER: Glenford Bayley. Lela Gell, MD  CLINICAL INFORMATION: 60 year old male with myasthenia gravis evaluation.  FINDINGS: NERVE CONDUCTION STUDY:  Right median motor responses normal distal latency, normal amplitude and slow conduction velocity.  Right ulnar motor response has normal distal latency, normal amplitude and slow conduction velocity with stimulation above the elbow.  Right median sensory response could not be obtained.  Right ulnar sensory response has decreased amplitude.  Right radial sensory response is normal.  Right ulnar F-wave latency is prolonged.  Right ulnar nerve repetitive nerve stimulation with recording of right abductor digiti minimi was performed at baseline 1 Hz and baseline 3 Hz, immediately postexercise and up to 5 minutes afterwards.  No decremental response was noted at baseline. There was significant immediate post exercise exhaustion and decremental response (67%).  Decremental response (24%) also noted at 30 seconds post exercise.  However at 1 minute, 2 minutes, 3 minutes, 4 minutes postexercise motor unit responses improved, except at 5 minutes with 10% decremental response.   NEEDLE ELECTROMYOGRAPHY:  Needle EMG of right upper extremity is normal.   IMPRESSION:   Abnormal study demonstrating: - Repetitive nerve stimulation of the right ulnar nerve and recording of the abductor digiti minimi shows decremental response immediately post exercise and 30 seconds post exercise.  Borderline decremental response noted at 5 minutes post exercises well.  Findings can be consistent with myasthenia gravis. - Right median neuropathy at the wrist consistent with right carpal tunnel syndrome. - Right  ulnar neuropathy at the elbow.   INTERPRETING PHYSICIAN:  Suanne Marker, MD Certified in Neurology, Neurophysiology and Neuroimaging  [facility] [address] [place], [place] [address] [phone]  [facility]    Nerve / Sites Muscle Latency Ref. Amplitude Ref. Rel Amp Segments Distance Velocity Ref. Area    ms ms mV mV %  cm m/s m/s mVms  R Median - APB     Wrist APB 4.4 ?4.4 5.5 ?4.0 100 Wrist - APB 7   16.1     Upper arm APB 10.1  4.3  77.2 Upper arm - Wrist 22 39 ?49 15.8  R Ulnar - ADM     Wrist ADM 3.3 ?3.3 7.2 ?6.0 100 Wrist - ADM 7   19.2     B.Elbow ADM 6.8  7.6  107 B.Elbow - Wrist 17 49 ?49 19.7     A.Elbow ADM 9.5  8.2  107 A.Elbow - B.Elbow 10 37 ?49 22.4         SNC    Nerve / Sites Rec. Site Peak Lat Ref.  Amp Ref. Segments Distance    ms ms V V  cm  R Radial - Anatomical snuff box (Forearm)     Forearm Wrist 2.5 ?2.9 15 ?15 Forearm - Wrist 10  R Median - Orthodromic (Dig II, Mid palm)     Dig II Wrist NR ?3.4 NR ?10 Dig II - Wrist 13  R Ulnar - Orthodromic, (Dig V, Mid palm)     Dig V Wrist 2.9 ?3.1 2 ?5 Dig V - Wrist 62           F  Wave    Nerve F Lat Ref.   ms ms  R Ulnar - ADM 35.2 ?32.0  Rep Stim    Anatomy / Train Rate Ampl. Ampl 4-1 Fac Ampl Area Area 4-1 Fac Area   Hz mV % % mVms % %  R Abductor digiti minimi (manus) - (Ulnar)  Baseline @1Hz  1 7.0 -0.6 100 17.7 -2.9 100  Baseline @3Hz  3 7.0 -1.9 99.2 17.6 -3.2 99.1  Post Exercise @0 :00 3 2.3 -7.6 32.5 6.0 -7.8 33.9  @ 0:30 3 5.4 3.8 76.2 15.4 -3.4 86.9  @ 1:00 3 7.0 -0.8 99 19.6 -5 110  @ 2:00 3 6.8 -1.6 96.4 18.4 -5.1 104  @ 3:00 3 6.7 0.3 94.6 17.3 -3.7 97.7  @ 4:00 3 6.7 -3.4 95.6 17.4 -5.7 98.4  @ 5:00 3 6.4 2.5 90.3 16.1 -1.9 90.9       EMG Summary Table    Spontaneous MUAP Recruitment  Muscle IA Fib PSW Fasc Other Amp Dur. Poly Pattern  R. Deltoid Normal None None None _______ Normal Normal Normal Normal  R. Biceps brachii Normal None None  None _______ Normal Normal Normal Normal  R. Triceps brachii Normal None None None _______ Normal Normal Normal Normal  R. Flexor carpi radialis Normal None None None _______ Normal Normal Normal Normal  R. First dorsal interosseous Normal None None None _______ Increased Normal Normal Reduced   Baseline 3Hz     Post exercise 30 sec  1 min  2 min  3 min  4 min  5 min

## 2021-01-29 ENCOUNTER — Telehealth (HOSPITAL_COMMUNITY): Payer: Self-pay | Admitting: *Deleted

## 2021-01-29 NOTE — Telephone Encounter (Signed)
Reaching out to patient to offer assistance regarding upcoming cardiac imaging study; pt's wife verbalizes understanding of appt date/time, parking situation and where to check in, pre-test NPO status and medications ordered, and verified current allergies; name and call back number provided for further questions should they arise  Larey Brick RN Navigator Cardiac Imaging [facility] [phone] office [phone] cell  Patient to take 100mg   metoprolol tartrate two hours prior to cardiac CT. Pt's wife aware patient is to arrive by 7:15am for his 7:45am scan.  She states he is a  difficult IV start.

## 2021-01-31 ENCOUNTER — Ambulatory Visit (HOSPITAL_COMMUNITY)
Admission: RE | Admit: 2021-01-31 | Discharge: 2021-01-31 | Disposition: A | Discharge status: Home / Self Care | Payer: BC Managed Care – PPO | Source: Ambulatory Visit | Attending: Cardiology | Admitting: Cardiology

## 2021-01-31 ENCOUNTER — Encounter (HOSPITAL_COMMUNITY): Payer: Self-pay

## 2021-01-31 ENCOUNTER — Other Ambulatory Visit: Payer: Self-pay

## 2021-01-31 DIAGNOSIS — I420 Dilated cardiomyopathy: Secondary | ICD-10-CM | POA: Diagnosis not present

## 2021-01-31 DIAGNOSIS — I700 Atherosclerosis of aorta: Secondary | ICD-10-CM

## 2021-01-31 HISTORY — PX: OTHER SURGICAL HISTORY: SHX169

## 2021-01-31 MED ORDER — METOPROLOL TARTRATE 5 MG/5ML IV SOLN
5.0000 mg | INTRAVENOUS | Status: DC | PRN
  Administered 2021-01-31: 10 mg via INTRAVENOUS

## 2021-01-31 MED ORDER — DILTIAZEM HCL 25 MG/5ML IV SOLN
INTRAVENOUS | Status: AC
  Administered 2021-01-31: 10 mg via INTRAVENOUS
  Filled 2021-01-31: qty 5

## 2021-01-31 MED ORDER — NITROGLYCERIN 0.4 MG SL SUBL: 0.8000 mg | SUBLINGUAL_TABLET | Freq: Once | SUBLINGUAL | Status: AC

## 2021-01-31 MED ORDER — DILTIAZEM HCL 25 MG/5ML IV SOLN: 5.0000 mg | INTRAVENOUS | Status: DC | PRN

## 2021-01-31 MED ORDER — NITROGLYCERIN 0.4 MG SL SUBL
SUBLINGUAL_TABLET | SUBLINGUAL | Status: AC
  Administered 2021-01-31: 0.8 mg via SUBLINGUAL
  Filled 2021-01-31: qty 2

## 2021-01-31 MED ORDER — IOHEXOL 350 MG/ML SOLN
95.0000 mL | Freq: Once | INTRAVENOUS | Status: AC | PRN
  Administered 2021-01-31: 95 mL via INTRAVENOUS

## 2021-01-31 MED ORDER — METOPROLOL TARTRATE 5 MG/5ML IV SOLN
INTRAVENOUS | Status: AC
  Filled 2021-01-31: qty 10

## 2021-02-05 ENCOUNTER — Other Ambulatory Visit: Payer: Self-pay

## 2021-02-05 ENCOUNTER — Telehealth: Payer: Self-pay | Admitting: Nurse Practitioner

## 2021-02-05 MED ORDER — FREESTYLE LIBRE 3 SENSOR MISC: 1.0000 | 2 refills | Status: DC

## 2021-02-05 NOTE — Telephone Encounter (Signed)
Sensors have been sent to pharmacy requested.

## 2021-02-05 NOTE — Telephone Encounter (Signed)
Pt is requesting refills on [place] Sensor 3. Please send to [facility] on [address] [place]

## 2021-02-06 NOTE — Telephone Encounter (Signed)
12/21 COMPLETE °

## 2021-02-07 ENCOUNTER — Other Ambulatory Visit: Payer: Self-pay

## 2021-02-07 ENCOUNTER — Ambulatory Visit (HOSPITAL_COMMUNITY): Payer: BC Managed Care – PPO | Attending: Internal Medicine

## 2021-02-07 DIAGNOSIS — I420 Dilated cardiomyopathy: Secondary | ICD-10-CM | POA: Diagnosis not present

## 2021-02-07 DIAGNOSIS — Q2112 Patent foramen ovale: Secondary | ICD-10-CM | POA: Diagnosis not present

## 2021-02-07 MED ORDER — SODIUM CHLORIDE 0.9% FLUSH
10.0000 mL | INTRAVENOUS | Status: DC | PRN
  Administered 2021-02-07: 20 mL via INTRAVENOUS

## 2021-02-19 ENCOUNTER — Ambulatory Visit (INDEPENDENT_AMBULATORY_CARE_PROVIDER_SITE_OTHER): Payer: 59 | Admitting: Nurse Practitioner

## 2021-02-19 ENCOUNTER — Other Ambulatory Visit: Payer: Self-pay

## 2021-02-19 ENCOUNTER — Encounter: Payer: Self-pay | Admitting: Nurse Practitioner

## 2021-02-19 VITALS — BP 127/83 | HR 89 | Ht 68.0 in | Wt 222.2 lb

## 2021-02-19 DIAGNOSIS — Z794 Long term (current) use of insulin: Secondary | ICD-10-CM

## 2021-02-19 DIAGNOSIS — I10 Essential (primary) hypertension: Secondary | ICD-10-CM

## 2021-02-19 DIAGNOSIS — E1165 Type 2 diabetes mellitus with hyperglycemia: Secondary | ICD-10-CM | POA: Diagnosis not present

## 2021-02-19 DIAGNOSIS — E782 Mixed hyperlipidemia: Secondary | ICD-10-CM | POA: Diagnosis not present

## 2021-02-19 LAB — POCT GLYCOSYLATED HEMOGLOBIN (HGB A1C): HbA1c, POC (controlled diabetic range): 7.1 % — AB (ref 0.0–7.0)

## 2021-02-19 MED ORDER — NOVOLOG FLEXPEN 100 UNIT/ML ~~LOC~~ SOPN: 4.0000 [IU] | PEN_INJECTOR | Freq: Three times a day (TID) | SUBCUTANEOUS | 3 refills | Status: DC

## 2021-02-19 MED ORDER — INSULIN GLARGINE (1 UNIT DIAL) 300 UNIT/ML ~~LOC~~ SOPN: 20.0000 [IU] | PEN_INJECTOR | Freq: Every day | SUBCUTANEOUS | 0 refills | Status: DC

## 2021-02-19 NOTE — Addendum Note (Signed)
Addended by: Ronita Hipps on: 02/19/2021 09:55 AM   Modules accepted: Orders

## 2021-02-19 NOTE — Progress Notes (Signed)
Endocrinology Follow Up Note       02/19/2021, 9:20 AM   Subjective:    Patient ID: Tony Combs, male    DOB: 16-Mar-1960.  Tony Combs is being seen in follow up after being seen in consultation for management of currently uncontrolled symptomatic diabetes requested by  Curlene Labrum, MD.   Past Medical History:  Diagnosis Date   Diabetes mellitus without complication ([place])    Dilated cardiomyopathy ([place]) 01/17/2021   In setting of stroke: TTE 08/31/2020: EF 30 and 35%.  Mildly decreased function.  Moderate LVH.  Unable to assess diastolic function.  Unable assess RV function.  Mild LA dilation.   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    Past Surgical History:  Procedure Laterality Date   HERNIA REPAIR     TRANSTHORACIC ECHOCARDIOGRAM  08/31/2020   a) EF 30 and 35%.  Mildly decreased function.  Moderate LVH.  Unable to assess diastolic function.  Unable assess RV function.  Mild LA dilation.; b) 12/21/2020: EF remains 30 to 35% with moderately reduced function.  Global HK.  GR 1 DD.  Normal RV size and function.  Normal aortic valves.  Cannot exclude small PFO.    Social History   Socioeconomic History   Marital status: Married    Spouse name: Not on file   Number of children: Not on file   Years of education: Not on file   Highest education level: Not on file  Occupational History   Not on file  Tobacco Use   Smoking status: Never   Smokeless tobacco: Never  Vaping Use   Vaping Use: Never used  Substance and Sexual Activity   Alcohol use: Not Currently   Drug use: Not Currently   Sexual activity: Not Currently  Other Topics Concern   Not on file  Social History Narrative   Not on file   Social Determinants of Health   Financial Resource Strain: Not on file  Food Insecurity: Not on file  Transportation  Needs: Not on file  Physical Activity: Not on file  Stress: Not on file  Social Connections: Not on file    Family History  Problem Relation Age of Onset   Heart failure Father 20   Heart attack Father 108   Hypertension Father    Diabetes type II Father    Coronary artery disease Father 38    Outpatient Encounter Medications as of 02/19/2021  Medication Sig   aspirin EC 81 MG tablet Take 81 mg by mouth daily. Swallow whole.   B-D UF III MINI PEN NEEDLES 31G X 5 MM MISC SMARTSIG:1 Pre-Filled Pen Syringe SUB-Q 4 Times Daily   blood glucose meter kit and supplies Dispense based on patient and insurance preference. Use up to four times daily as directed. (FOR ICD-10 E10.9, E11.9).   Continuous Blood Gluc Sensor (FREESTYLE LIBRE 3 SENSOR) MISC Inject 1 applicator into the skin every 14 (fourteen) days.   Insulin Pen Needle (PEN NEEDLES) 30G X 5 MM MISC 1 pen by Does not apply route 4 (four) times daily - after  meals and at bedtime.   pyridostigmine (MESTINON) 60 MG tablet Take 0.5 tablets (30 mg total) by mouth 3 (three) times daily. Take 0.5 tablets 3 times daily for 1 week and then 1 tablet 3 times daily if tolerated   sacubitril-valsartan (ENTRESTO) 49-51 MG Take 1 tablet by mouth 2 (two) times daily.   [DISCONTINUED] insulin glargine, 1 Unit Dial, (TOUJEO) 300 UNIT/ML Solostar Pen Inject 25 Units into the skin at bedtime.   [DISCONTINUED] NOVOLOG FLEXPEN 100 UNIT/ML FlexPen SMARTSIG:5 Unit(s) SUB-Q 3 Times Daily   atorvastatin (LIPITOR) 80 MG tablet Take 1 tablet (80 mg total) by mouth daily.   carvedilol (COREG) 6.25 MG tablet Take 1 tablet (6.25 mg total) by mouth 2 (two) times daily with a meal.   insulin glargine, 1 Unit Dial, (TOUJEO) 300 UNIT/ML Solostar Pen Inject 20 Units into the skin at bedtime.   metoprolol tartrate (LOPRESSOR) 50 MG tablet Take 2 tablets (100 mg total) by mouth once for 1 dose. TAKE TWO HOURS PRIOR TO  SCHEDULE CARDIAC TEST   NOVOLOG FLEXPEN 100 UNIT/ML FlexPen  Inject 4-7 Units into the skin 3 (three) times daily with meals.   spironolactone (ALDACTONE) 25 MG tablet Take 1 tablet (25 mg total) by mouth daily.   [DISCONTINUED] Insulin Aspart PenFill 100 UNIT/ML SOCT Inject 4-7 Units into the skin 3 (three) times daily before meals. (Patient not taking: Reported on 02/19/2021)   Facility-Administered Encounter Medications as of 02/19/2021  Medication   sodium chloride flush (NS) 0.9 % injection 10 mL    ALLERGIES: No Known Allergies  VACCINATION STATUS:  There is no immunization history on file for this patient.  Diabetes He presents for his follow-up diabetic visit. He has type 2 diabetes mellitus. Onset time: Diagnosed at approx age of 22. His disease course has been improving. There are no hypoglycemic associated symptoms. Pertinent negatives for diabetes include no fatigue. There are no hypoglycemic complications. Symptoms are improving. Diabetic complications include a CVA and heart disease. Risk factors for coronary artery disease include diabetes mellitus, dyslipidemia, family history, male sex, obesity, hypertension and sedentary lifestyle. Current diabetic treatment includes intensive insulin program. He is compliant with treatment most of the time. His weight is stable. He is following a generally healthy diet. When asked about meal planning, he reported none. He has not had a previous visit with a dietitian. He rarely participates in exercise. His home blood glucose trend is decreasing steadily. His breakfast blood glucose range is generally 140-180 mg/dl. His lunch blood glucose range is generally 140-180 mg/dl. His dinner blood glucose range is generally 140-180 mg/dl. His bedtime blood glucose range is generally 140-180 mg/dl. His overall blood glucose range is 140-180 mg/dl. (He presents today with his CGM, no logs, showing greatly improved glycemic profile overall.  His POCT A1c today is 7.1% improving from last visit of 10.5%.  He denies any  hypoglycemia.  He does report he cheated on his diet a bit during the holidays but is getting back on track.  Analysis of his CGM shows TIR 68%, TAR 32%, TBR 0%.  He is seeing his cardiologist later this month for a blockage.) An ACE inhibitor/angiotensin II receptor blocker is being taken. He does not see a podiatrist.Eye exam is current.  Hypertension This is a chronic problem. The current episode started more than 1 year ago. The problem has been resolved since onset. The problem is controlled. There are no associated agents to hypertension. Risk factors for coronary artery disease include diabetes mellitus,  dyslipidemia, family history, obesity, male gender and sedentary lifestyle. Past treatments include beta blockers, diuretics and angiotensin blockers. The current treatment provides significant improvement. There are no compliance problems.  Hypertensive end-organ damage includes CAD/MI and CVA.  Hyperlipidemia This is a chronic problem. The current episode started more than 1 year ago. The problem is controlled. Recent lipid tests were reviewed and are normal. Exacerbating diseases include diabetes and obesity. Factors aggravating his hyperlipidemia include beta blockers and fatty foods. Current antihyperlipidemic treatment includes statins. The current treatment provides moderate improvement of lipids. Compliance problems include adherence to diet and adherence to exercise.  Risk factors for coronary artery disease include diabetes mellitus, dyslipidemia, family history, hypertension, male sex, obesity and a sedentary lifestyle.    Review of systems  Constitutional: + Minimally fluctuating body weight, current Body mass index is 33.79 kg/m., + fatigue, no subjective hyperthermia, no subjective hypothermia Eyes: no blurry vision, no xerophthalmia ENT: no sore throat, no nodules palpated in throat, + dysphagia from previous stroke, no hoarseness Cardiovascular: no chest pain, no shortness of  breath, no palpitations, no leg swelling Respiratory: no cough, no shortness of breath Gastrointestinal: no nausea/vomiting/diarrhea Musculoskeletal: no muscle/joint aches Skin: no rashes, no hyperemia Neurological: no tremors, no numbness, no tingling, no dizziness Psychiatric: no depression, no anxiety  Objective:     BP 127/83    Pulse 89    Ht _0  (1.727 m)    Wt 222 lb 3.2 oz (100.8 kg)    SpO2 98%    BMI 33.79 kg/m   Wt Readings from Last 3 Encounters:  02/19/21 222 lb 3.2 oz (100.8 kg)  01/17/21 224 lb 3.2 oz (101.7 kg)  12/06/20 212 lb (96.2 kg)     BP Readings from Last 3 Encounters:  02/19/21 127/83  01/31/21 (!) 144/74  01/17/21 130/78      Physical Exam- Limited  Constitutional:  Body mass index is 33.79 kg/m. , not in acute distress, normal state of mind Eyes:  EOMI, no exophthalmos Neck: Supple Cardiovascular: RRR, no murmurs, rubs, or gallops, no edema Respiratory: Adequate breathing efforts, no crackles, rales, rhonchi, or wheezing Musculoskeletal: no gross deformities, strength intact in all four extremities, no gross restriction of joint movements Skin:  no rashes, no hyperemia Neurological: no tremor with outstretched hands    Diabetic Foot Exam - Simple   No data filed       CMP ( most recent) CMP     Component Value Date/Time   NA 137 01/17/2021 0911   K 4.6 01/17/2021 0911   CL 98 01/17/2021 0911   CO2 25 01/17/2021 0911   GLUCOSE 127 (H) 01/17/2021 0911   GLUCOSE 161 (H) 09/02/2020 0451   BUN 12 01/17/2021 0911   CREATININE 0.74 (L) 01/17/2021 0911   CALCIUM 9.0 01/17/2021 0911   PROT 6.5 09/02/2020 0451   ALBUMIN 3.2 (L) 09/02/2020 0451   AST 26 09/02/2020 0451   ALT 38 09/02/2020 0451   ALKPHOS 66 09/02/2020 0451   BILITOT 0.9 09/02/2020 0451   GFRNONAA 95 09/23/2020 0000   GFRNONAA >60 09/02/2020 0451     Diabetic Labs (most recent): Lab Results  Component Value Date   HGBA1C 10.5 09/22/2020   HGBA1C 11.7 (H)  08/31/2020     Lipid Panel ( most recent) Lipid Panel     Component Value Date/Time   CHOL 143 08/31/2020 0321   TRIG 281 (H) 08/31/2020 0321   HDL 26 (L) 08/31/2020 0321   CHOLHDL 5.5 08/31/2020  0321   VLDL 56 (H) 08/31/2020 0321   LDLCALC 61 08/31/2020 0321      Lab Results  Component Value Date   TSH 0.787 09/01/2020           Assessment & Plan:   1) Type 2 diabetes mellitus with hyperglycemia, with long-term current use of insulin ([place])  He presents today with his CGM, no logs, showing greatly improved glycemic profile overall.  His POCT A1c today is 7.1% improving from last visit of 10.5%.  He denies any hypoglycemia.  He does report he cheated on his diet a bit during the holidays but is getting back on track.  Analysis of his CGM shows TIR 68%, TAR 32%, TBR 0%.  He is seeing his cardiologist later this month for a blockage.  - LEUL NARRAMORE has currently uncontrolled symptomatic type 2 DM since 61 years of age.  -Recent labs reviewed.  - I had a long discussion with him about the progressive nature of diabetes and the pathology behind its complications. -his diabetes is complicated by CVA, CAD and he remains at a high risk for more acute and chronic complications which include CAD, CVA, CKD, retinopathy, and neuropathy. These are all discussed in detail with him.  - Nutritional counseling repeated at each appointment due to patients tendency to fall back in to old habits.  - The patient admits there is a room for improvement in their diet and drink choices. -  Suggestion is made for the patient to avoid simple carbohydrates from their diet including Cakes, Sweet Desserts / Pastries, Ice Cream, Soda (diet and regular), Sweet Tea, Candies, Chips, Cookies, Sweet Pastries, Store Bought Juices, Alcohol in Excess of 1-2 drinks a day, Artificial Sweeteners, Coffee Creamer, and "Sugar-free" Products. This will help patient to have stable blood glucose profile and potentially  avoid unintended weight gain.   - I encouraged the patient to switch to unprocessed or minimally processed complex starch and increased protein intake (animal or plant source), fruits, and vegetables.   - Patient is advised to stick to a routine mealtimes to eat 3 meals a day and avoid unnecessary snacks (to snack only to correct hypoglycemia).  - he will be scheduled with Jearld Fenton, RDN, CDE for diabetes education.    - I have approached him with the following individualized plan to manage his diabetes and patient agrees:   -Based on his stable glycemic profile and lack of hypoglycemia, he is advised to continue his Toujeo 20 units SQ nightly and Novolog 4-7 units TID with meals if glucose is above 90 and he is eating (Specific instructions on how to titrate insulin dosage based on glucose readings given to patient in writing).     -he is encouraged to continue monitoring glucose 4 times daily (using his CGM), before meals and before bed, and to call the clinic if he has readings less than 70 or above 300 for 3 tests in a row.  - he is warned not to take insulin without proper monitoring per orders. - Adjustment parameters are given to him for hypo and hyperglycemia in writing.  - Specific targets for  A1c; LDL, HDL, and Triglycerides were discussed with the patient.  2) Blood Pressure /Hypertension:  his blood pressure is controlled to target.   he is advised to continue his current medications including Coreg 6.25 mg p.o. twice daily, Entresto 49-51 mg po daily, and Spironolactone 25 mg po daily.  3) Lipids/Hyperlipidemia:    Review of his  recent lipid panel from 08/31/20 showed controlled LDL at 61 and elevated triglycerides of 281 .  he is advised to continue Lipitor 80 mg daily at bedtime.  Side effects and precautions discussed with him.  4)  Weight/Diet:  his Body mass index is 33.79 kg/m.  -  clearly complicating his diabetes care.   he is a candidate for weight loss. I  discussed with him the fact that loss of 5 - 10% of his  current body weight will have the most impact on his diabetes management.  Exercise, and detailed carbohydrates information provided  -  detailed on discharge instructions.  5) Chronic Care/Health Maintenance: -he is on ACEI/ARB and Statin medications and is encouraged to initiate and continue to follow up with Ophthalmology, Dentist, Podiatrist at least yearly or according to recommendations, and advised to stay away from smoking. I have recommended yearly flu vaccine and pneumonia vaccine at least every 5 years; moderate intensity exercise for up to 150 minutes weekly; and sleep for at least 7 hours a day.  - he is advised to maintain close follow up with [place], Virgina Evener, MD for primary care needs, as well as his other providers for optimal and coordinated care.      I spent 40 minutes in the care of the patient today including review of labs from [facility], Lipids, Thyroid Function, Hematology (current and previous including abstractions from other facilities); face-to-face time discussing  his blood glucose readings/logs, discussing hypoglycemia and hyperglycemia episodes and symptoms, medications doses, his options of short and long term treatment based on the latest standards of care / guidelines;  discussion about incorporating lifestyle medicine;  and documenting the encounter.    Please refer to Patient Instructions for Blood Glucose Monitoring and Insulin/Medications Dosing Guide"  in media tab for additional information. Please  also refer to " Patient Self Inventory" in the Media  tab for reviewed elements of pertinent patient history.  Maxwell Marion participated in the discussions, expressed understanding, and voiced agreement with the above plans.  All questions were answered to his satisfaction. he is encouraged to contact clinic should he have any questions or concerns prior to his return visit.     Follow up plan: - Return in  about 4 months (around 06/19/2021) for Diabetes F/U with A1c in office, No previsit labs, Bring meter and logs.    Rayetta Pigg, [facility] [address] [place], [place] [address] Phone: [phone] Fax: [phone]  02/19/2021, 9:20 AM

## 2021-02-19 NOTE — Patient Instructions (Signed)

## 2021-02-26 ENCOUNTER — Encounter: Payer: Self-pay | Admitting: Neurology

## 2021-02-26 ENCOUNTER — Ambulatory Visit (INDEPENDENT_AMBULATORY_CARE_PROVIDER_SITE_OTHER): Payer: 59 | Admitting: Neurology

## 2021-02-26 VITALS — BP 125/76 | HR 81 | Ht 68.0 in | Wt 223.5 lb

## 2021-02-26 DIAGNOSIS — G5601 Carpal tunnel syndrome, right upper limb: Secondary | ICD-10-CM

## 2021-02-26 DIAGNOSIS — I6381 Other cerebral infarction due to occlusion or stenosis of small artery: Secondary | ICD-10-CM | POA: Diagnosis not present

## 2021-02-26 DIAGNOSIS — G7000 Myasthenia gravis without (acute) exacerbation: Secondary | ICD-10-CM | POA: Diagnosis not present

## 2021-02-26 MED ORDER — VYVGART 400 MG/20ML IV SOLN: 1000.0000 mg | INTRAVENOUS | 3 refills | Status: DC

## 2021-02-26 NOTE — Progress Notes (Signed)
[facility] [address] [place]. [place] [address] [phone]       OFFICE FOLLOW-UP NOTE  [name] Date of Birth:  1960-10-19 Medical Record Number:  [id number]   HPI: Last visit 12/06/2020 [name] is a 61 year old Caucasian male seen today for initial office follow-up visit following hospital consultation for stroke in July 2022.  History is obtained from the patient and his wife and review of electronic medical records and I personally reviewed pertinent imaging films in PACS.  He has past medical history of hypertension, hyperlipidemia, diabetes, coronary artery disease who presented to [facility] ED on 08/31/2020 for sudden onset of slurred speech with visual blurring and double vision.  Symptoms began the day prior to admission she went to [facility] where his noncontrast CT scan of the head was done which was unremarkable but before seeing the doctor she left [place].  She presented to [facility] ED the next day with complaints of persistent slurred speech with headache and blurred and double vision.  He also noted right side of the face felt weak and tingling.  He also felt off balance and dizzy at work but denies any focal extremity weakness.  MRI scan of the brain showed a 12 mm acute infarct in the anterior medial right frontal subcortical white matter.  Remote age tiny chronic left thalamic infarct was also noted.  MR angiogram study of the brain showed multifocal stenoses.with mild-to-moderate stenosis within the distal A2 segment of the right anterior cerebral artery. Severe stenosis within the left posterior cerebral artery at the P1/P2 junction. and MRA of the neck was unremarkable.  2D echo showed diminished ejection fraction of 30 to 35% but no clot.  LDL cholesterol 61 mg percent and hemoglobin A1c was elevated at 11.7.  Patient was felt to be at high risk for sleep apnea and offered participation in the sleep smart study for  stroke prevention and agreed to participate.  He was randomized to this CPAP treatment for his sleep apnea.  On exam patient was felt to have dysarthric and hypophonic speech with subtle ptosis of his left eye with left pupil being smaller in size in the right and because of his subjective complaints of diplopia, dysarthria and gait ataxia felt to have a small brainstem infarct not visualized on the MRI in addition to which was seen in the right subcortical region which could not explain his symptoms.  He was discharged on aspirin and Plavix for 3 months followed by aspirin alone.  Patient states that is obtained some improvement but his wife still becomes soft and slurred when he is tired.  Is also has some intermittent swallowing difficulties.  He also complains of persistent blurred and double vision particularly towards the end of the day.  His mornings are much better.  His balance is still not right so uses a cane to walk.  He has had no falls or injuries.  His wife has noted that at the end of the day his eyes are not synchronous and the left eye is does not move as much as the right.  Patient is tolerating aspirin well without bruising or bleeding.  His blood pressure is well controlled today it is 129/75.  His sugars are doing better and hemoglobin A1c is now down to 7.  His average blood glucose for the last 10 days is 142.  He remains on Lipitor which is tolerating well without side effects.  He  has been compliant with using his CPAP mask which he got through the sleep smart study and feels it is helping him.  Update 02/26/2021 : He returns for follow-up after last visit 2-1/2 months ago.  Is recommended by his wife.  Patient continues to have speech and swallowing difficulties which is more or less unchanged.  In his diet his speech often becomes slurred.  He denies any double vision but wife does notice some drooping eyelids to the end of the day.  He also has trouble swallowing at times.  Patient did  not tolerate Mestinon due to cramps and diarrhea and he takes only 30 mg once a day he is not sure if this is helping him.  He denies any trouble getting out of chair or walking or weakness of proximal shoulder or thigh muscles.  Lab work on 12/06/2020 shows strongly positive acetylcholine receptor binding antibody titer of 3.62 as well as acetylcholine blocking antibody in a titer of 95.  He previously had CT scan of the chest done on 08/30/2020 which did not show any thymoma but showed some mediastinal lymphadenopathy.  Hemoglobin A1c done on 02/19/2021 was elevated at 7.1.  EMG nerve conduction study with rapid repetitive stimulation on 01/24/2021 was positive for decrement confirming diagnosis of myasthenia.  It also showed evidence of moderate right carpal tunnel and right ulnar nerve entrapment at the elbow.  Patient does complain of hand paresthesias and some grip weakness.  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  ROS:   14 system review of systems is positive for blurred vision, double vision, soft voice, dysphagia, tiredness, fatigue, imbalance of gait, hand tingling, numbness and weakness and all other systems negative  PMH:  Past Medical History:  Diagnosis Date   Diabetes mellitus without complication ([place])    Dilated cardiomyopathy ([place]) 01/17/2021   In setting of stroke: TTE 08/31/2020: EF 30 and 35%.  Mildly decreased function.  Moderate LVH.  Unable to assess diastolic function.  Unable assess RV function.  Mild LA dilation.   Hyperlipidemia    Hypertension    Stroke ([place]) 08/30/2020   Right frontal lobe stroke with dysarthria and blurred vision.    Social History:  Social History   Socioeconomic History   Marital status: Married    Spouse name: Not on file   Number of children: Not on file   Years of education: Not on file   Highest education level: Not on file  Occupational History   Not on file  Tobacco Use   Smoking status: Never    Smokeless tobacco: Never  Vaping Use   Vaping Use: Never used  Substance and Sexual Activity   Alcohol use: Not Currently   Drug use: Not Currently   Sexual activity: Not Currently  Other Topics Concern   Not on file  Social History Narrative   Not on file   Social Determinants of Health   Financial Resource Strain: Not on file  Food Insecurity: Not on file  Transportation Needs: Not on file  Physical Activity: Not on file  Stress: Not on file  Social Connections: Not on file  Intimate Partner Violence: Not on file    Medications:   Current Outpatient Medications on File Prior to Visit  Medication Sig Dispense Refill   aspirin EC 81 MG tablet Take 81 mg by mouth daily. Swallow whole.     B-D UF III MINI PEN NEEDLES 31G X 5 MM MISC  SMARTSIG:1 Pre-Filled Pen Syringe SUB-Q 4 Times Daily     blood glucose meter kit and supplies Dispense based on patient and insurance preference. Use up to four times daily as directed. (FOR ICD-10 E10.9, E11.9). 1 each 0   Continuous Blood Gluc Sensor (FREESTYLE LIBRE 3 SENSOR) MISC Inject 1 applicator into the skin every 14 (fourteen) days. 2 each 2   insulin glargine, 1 Unit Dial, (TOUJEO) 300 UNIT/ML Solostar Pen Inject 20 Units into the skin at bedtime. 1.5 mL 0   Insulin Pen Needle (PEN NEEDLES) 30G X 5 MM MISC 1 pen by Does not apply route 4 (four) times daily - after meals and at bedtime. 200 each 1   NOVOLOG FLEXPEN 100 UNIT/ML FlexPen Inject 4-7 Units into the skin 3 (three) times daily with meals. 15 mL 3   pyridostigmine (MESTINON) 60 MG tablet Take 0.5 tablets (30 mg total) by mouth 3 (three) times daily. Take 0.5 tablets 3 times daily for 1 week and then 1 tablet 3 times daily if tolerated 90 tablet 1   sacubitril-valsartan (ENTRESTO) 49-51 MG Take 1 tablet by mouth 2 (two) times daily. 60 tablet 5   atorvastatin (LIPITOR) 80 MG tablet Take 1 tablet (80 mg total) by mouth daily. 30 tablet 0   carvedilol (COREG) 6.25 MG tablet Take 1 tablet  (6.25 mg total) by mouth 2 (two) times daily with a meal. 60 tablet 0   metoprolol tartrate (LOPRESSOR) 50 MG tablet Take 2 tablets (100 mg total) by mouth once for 1 dose. TAKE TWO HOURS PRIOR TO  SCHEDULE CARDIAC TEST 1 tablet 0   spironolactone (ALDACTONE) 25 MG tablet Take 1 tablet (25 mg total) by mouth daily. 30 tablet 0   Current Facility-Administered Medications on File Prior to Visit  Medication Dose Route Frequency Provider Last Rate Last Admin   sodium chloride flush (NS) 0.9 % injection 10 mL  10 mL Intravenous PRN Leonie Man, MD   20 mL at 02/07/21 1610    Allergies:  No Known Allergies  Physical Exam General: Obese middle-aged Caucasian male, seated, in no evident distress Head: head normocephalic and atraumatic.  Neck: supple with no carotid or supraclavicular bruits Cardiovascular: regular rate and rhythm, no murmurs Musculoskeletal: no deformity Skin:  no rash/petichiae Vascular:  Normal pulses all extremities Vitals:   02/26/21 1505  BP: 125/76  Pulse: 81   Neurologic Exam Mental Status: Awake and fully alert. Oriented to place and time. Recent and remote memory intact. Attention span, concentration and fund of knowledge appropriate. Mood and affect appropriate.  Slight hypophonic soft voice.  No dysarthria. Cranial Nerves: Fundoscopic exam not done. Pupils equal, briskly reactive to light. Extraocular movements full without nystagmus. Visual fields full to confrontation. Hearing intact. Facial sensation intact. Face, tongue, palate moves normally and symmetrically.  Mild drooping of both eyelids on sustained upgaze with subjective diplopia but no ophthalmoplegia. Motor: Normal bulk and tone. Normal strength in all tested extremity muscles. Sensory.: intact to touch ,pinprick .position and vibratory sensation.  Coordination: Rapid alternating movements normal in all extremities. Finger-to-nose and heel-to-shin performed accurately bilaterally. Gait and Station:  Arises from chair without difficulty. Stance is normal. Gait demonstrates normal stride length and balance .  Unable to heel, toe and tandem walk without difficulty.  Reflexes: 1+ and symmetric. Toes downgoing.      ASSESSMENT: 61 year old Caucasian male with sudden onset of slurred speech, diplopia and dysphagia in July 2022 likely from small brainstem infarct not visualized on MRI  from small vessel disease.  MRI at that time had shown also right periventricular white matter infarct which is probably silent.  Multiple vascular risk factors of hypertension, diabetes, hyperlipidemia obesity and sleep apnea.  Patient has been randomized to the CPAP treatment arm of the sleep smart study.   new symptoms of increased fatigability tiredness and worsening speech and swallowing from antibody positive  myasthenia.     PLAN:I had a long discussion with the patient and his wife regarding his diagnosis of antibody positive myasthenia gravis and unfortunately is having trouble tolerating Mestinon.  I encouraged him to take Mestinon 30 mg with meals twice daily for a week and if tolerated increase to 3 times daily at least.  We may also try to see if the new medication Vyvgart which has been recently approved for antibody-positive myasthenia if it can be covered by his insurance and will fill out paperwork for the same.  I also advised him to wear a right wrist extension splint for carpal tunnel and to limit activities which involve rapid repetitive wrist flexion movements.  Continue aspirin for stroke prevention with aggressive risk factor modification strict control of hypertension with blood pressure goal below 130/90, lipids with LDL cholesterol goal below 70 mg percent and diabetes with hemoglobin A1c goal below 6.5%.  He was also advised to eat a healthy diet and exercise and lose weight.  He will return for follow-up in 3 to 4 months or call earlier if necessary.Greater than 50% of time during this 35 minute  visit was spent on counseling,explanation of diagnosis, planning of further management, discussion with patient and family and coordination of care Antony Contras, MD Note: This document was prepared with digital dictation and possible smart phrase technology. Any transcriptional errors that result from this process are unintentional

## 2021-02-26 NOTE — Patient Instructions (Signed)
I had a long discussion with the patient and his wife regarding his diagnosis of antibody positive myasthenia gravis and unfortunately is having trouble tolerating Mestinon.  I encouraged him to take Mestinon 30 mg with meals twice daily for a week and if tolerated increase to 3 times daily at least.  We may also try to see if the new medication Vyvgart can be covered by his insurance and will fill out paperwork for the same.  I also advised him to wear a right wrist extension splint for carpal tunnel and to limit activities which involve rapid repetitive wrist flexion movements.  Continue aspirin for stroke prevention with aggressive risk factor modification strict control of hypertension with blood pressure goal below 130/90, lipids with LDL cholesterol goal below 70 mg percent and diabetes with hemoglobin A1c goal below 6.5%.  He was also advised to eat a healthy diet and exercise and lose weight.  He will return for follow-up in 3 to 4 months or call earlier if necessary.

## 2021-02-27 ENCOUNTER — Telehealth: Payer: Self-pay | Admitting: *Deleted

## 2021-02-27 NOTE — Telephone Encounter (Addendum)
Vyvgart start form signed by patient during his visit with Dr. Pearlean Brownie on 03/08/21. Provided to [employer] to start authorization process.  Myasthenia Gravis (ICD 10: G70.01). Only tried Mestinon in the past. Stopped - caused cramps, diarrhea.   Dr. Pearlean Brownie completed the following scales:  1) MG-ADL: Score 8 2) MGFA Clinical Classification: Class: IIb

## 2021-03-01 ENCOUNTER — Encounter: Payer: Self-pay | Admitting: Cardiology

## 2021-03-01 ENCOUNTER — Ambulatory Visit (INDEPENDENT_AMBULATORY_CARE_PROVIDER_SITE_OTHER): Payer: 59 | Admitting: Cardiology

## 2021-03-01 ENCOUNTER — Other Ambulatory Visit: Payer: Self-pay

## 2021-03-01 VITALS — BP 138/74 | HR 89 | Ht 68.0 in | Wt 225.0 lb

## 2021-03-01 DIAGNOSIS — E875 Hyperkalemia: Secondary | ICD-10-CM

## 2021-03-01 DIAGNOSIS — I420 Dilated cardiomyopathy: Secondary | ICD-10-CM | POA: Diagnosis not present

## 2021-03-01 DIAGNOSIS — E1169 Type 2 diabetes mellitus with other specified complication: Secondary | ICD-10-CM | POA: Diagnosis not present

## 2021-03-01 DIAGNOSIS — I700 Atherosclerosis of aorta: Secondary | ICD-10-CM | POA: Insufficient documentation

## 2021-03-01 DIAGNOSIS — I10 Essential (primary) hypertension: Secondary | ICD-10-CM | POA: Diagnosis not present

## 2021-03-01 DIAGNOSIS — I5042 Chronic combined systolic (congestive) and diastolic (congestive) heart failure: Secondary | ICD-10-CM

## 2021-03-01 DIAGNOSIS — Q2112 Patent foramen ovale: Secondary | ICD-10-CM

## 2021-03-01 DIAGNOSIS — E785 Hyperlipidemia, unspecified: Secondary | ICD-10-CM

## 2021-03-01 DIAGNOSIS — R931 Abnormal findings on diagnostic imaging of heart and coronary circulation: Secondary | ICD-10-CM | POA: Diagnosis not present

## 2021-03-01 DIAGNOSIS — I639 Cerebral infarction, unspecified: Secondary | ICD-10-CM

## 2021-03-01 DIAGNOSIS — G7000 Myasthenia gravis without (acute) exacerbation: Secondary | ICD-10-CM | POA: Insufficient documentation

## 2021-03-01 HISTORY — DX: Myasthenia gravis without (acute) exacerbation: G70.00

## 2021-03-01 MED ORDER — ENTRESTO 49-51 MG PO TABS: 1.0000 | ORAL_TABLET | Freq: Two times a day (BID) | ORAL | 3 refills | Status: DC

## 2021-03-01 NOTE — Assessment & Plan Note (Addendum)
His blood pressure is borderline elevated today.  Unfortunately, he is having issues with new insurance company not covering his [place].  For now we will see if we can stretch out his existing Entresto until he can potentially extend [facility] with patient assistance. We will have him take 1/2 tablet twice daily Entresto, and increase carvedilol to 12.5 mg twice daily. Continue spironolactone  If were not able to obtain Entresto, will switch him to valsartan 160 mg daily.

## 2021-03-01 NOTE — H&P (View-Only) (Signed)
Primary Care Provider: Curlene Labrum, MD Cardiologist: [name], MD Neurologist: Dr. Antony Contras, Electrophysiologist: None  Clinic Note: Chief Complaint  Patient presents with   Follow-up    1 month follow-up after echo and Coronary CTA.   Congestive Heart Failure    Cardiomyopathy on echo.  He does have some exertional dyspnea   Coronary Artery Disease    Abnormal findings on Coronary CTA, occasional chest pain   ===================================  ASSESSMENT/PLAN   Problem List Items Addressed This Visit       Cardiology Problems   Dilated cardiomyopathy ([place]) (Chronic)    Still not sure of the etiology.  I do not think single-vessel CAD as the cause, but with positive CT FFR, we will proceed with cardiac catheterization. He does seem euvolemic on exam with class II CHF symptoms based on simply some exertional dyspnea.  No PND orthopnea.  Unfortunately, his EF did not improve with beta-blocker and Entresto.  Plan: Proceed with cardiac catheterization with PCI.  ->  If LVEDP is elevated, would do right heart cath We can then reassess EF in 3 months after PCI.  If still reduced, would refer for ICD. Continue to try to get Entresto.  Stretch out existing Entresto by reduced to 1/2 tablet twice daily and increasing carvedilol to 12.5 mg twice daily We do have a plan to switch to valsartan 160 mg daily if for what ever reason he is not able to get patient assistance to continue Entresto Continue spironolactone No diuretic requirement other than spironolactone at this point. We will ask inpatient cardiology pharmacist to do cost analysis for SGLT2 inhibitor, and also potentially help with Entresto      Relevant Medications   sacubitril-valsartan (ENTRESTO) 49-51 MG   Other Relevant Orders   Basic metabolic panel   CBC   Thoracic aortic atherosclerosis ([place]) (Chronic)    In addition to coronary artery disease seen on CT scan, there is also evidence of  atherosclerotic aortic disease.  Continue to titrate aggressive risk factor modification with blood pressure, lipid and glycemic control.  On stable regimen per CAD and hyperlipidemia sections.      Relevant Medications   sacubitril-valsartan (ENTRESTO) 49-51 MG   Chronic combined systolic and diastolic heart failure (HCC) (Chronic)    Has pretty much been euvolemic with class I-II symptoms mostly just exertional dyspnea.  Unfortunately EF did not improve.  On stable dose of Entresto and carvedilol, unfortunately with him not be able to continue Entresto and less he has patient assistance, we may have to convert to valsartan. For now we are going to cut Entresto dose in half to stretch out what he has and increase carvedilol dose to 12.5 mg twice daily. Also on spironolactone, not yet on SGLT2 inhibitor.  (Will need cost analysis)  No diuretic requirement besides spironolactone      Relevant Medications   sacubitril-valsartan (ENTRESTO) 49-51 MG   Acute CVA (cerebrovascular accident) ([place]) (Chronic)    Far enough out to be safe to do cardiac catheterization. On aspirin and high-dose statin Will be on Plavix if PCI performed.      Relevant Medications   sacubitril-valsartan (ENTRESTO) 49-51 MG   Essential hypertension (Chronic)    His blood pressure is borderline elevated today.  Unfortunately, he is having issues with new insurance company not covering his [place].  For now we will see if we can stretch out his existing Entresto until he can potentially extend [facility] with patient assistance. We will  have him take 1/2 tablet twice daily Entresto, and increase carvedilol to 12.5 mg twice daily. Continue spironolactone  If were not able to obtain Entresto, will switch him to valsartan 160 mg daily.      Relevant Medications   sacubitril-valsartan (ENTRESTO) 49-51 MG   PFO (patent foramen ovale) (Chronic)    Suggested on echocardiogram, negative bubble study.      Relevant  Medications   sacubitril-valsartan (ENTRESTO) 49-51 MG   Hyperlipidemia associated with type 2 diabetes mellitus (HCC) (Chronic)    On 80 mg atorvastatin.  Labs as of July showed LDL 61.  I suspect that he was to have labs checked prior to now, but since they have not yet been done, will draw when he goes for heart catheterization.  Continue 80 mg Lipitor given severe findings on coronary CTA.  He is now on insulin.  Pretty much because of cost concerns with using Entresto and now Vyvart, I am reluctant to recommend SGLT2 inhibitor.  However, in the future would like to add either [place] or [place]..      Relevant Medications   sacubitril-valsartan (ENTRESTO) 49-51 MG     Other   Abnormal cardiac CT angiography - Primary    Significantly positive CT FFR findings on what appears to be a codominant large RCA.  The CTA Heart Flow diagram does not make the RCA look rather large.  However it was read as a large vessel.  In light of the reduced EF, and evidence of CT FFR positive lesion of the RCA, I do think it is reasonable to proceed with cardiac catheterization and possible PCI.  Plan: We will schedule LEFT HEART CATHETERIZATION WITH CORONARY ANGIOGRAPHY AND POSSIBLE PERCUTANEOUS CORONARY INTERVENTION for March 11, 2021 Increase carvedilol to 12.5 mg twice daily Continue 80 mg atorvastatin, check lipid panel in Cath Lab once we obtain access. Continue aspirin      Relevant Orders   Basic metabolic panel   CBC   Hyperkalemia (Chronic)    Stable on last check.  We will continue to monitor.      ===================================  HPI:    Tony Combs is a 61 y.o. male with a PMH Notable for Recently Diagnosed Dilated Cardiomyopathy (EF 30 to 35% with global HK) diagnosed in the setting of recent CVA on 08/24/2020, and OSA (not yet on CPAP) who presents today for 1 month follow-up to discuss results with Coronary CTA showing occlusive RCA disease and echocardiogram (with bubble  study).  08/24/2020: Right frontal lobe CVA-dysarthria and blurred vision with unilateral left leg weakness. => Echo showed EF 30 to 35%.  Plan was outpatient evaluation.  Was started on carvedilol and Entresto prior to discharge along with high-dose atorvastatin, aspirin and Plavix. Dilated CM => initial post hospital follow-up on 11/06/2020 with Almyra Deforest, PA -> Entresto titrated up to 49 to 51 mg daily.  Spironolactone delayed because of concerns of hyperkalemia.  Plan was echo follow-up in 3 months. OSA had been randomized to CPAP treatment arm of the "Sleep Smart Study " He was also recently diagnosed with Myasthenia Gravis (Nov 2022)-he was started on Mestinon, but had nausea, dose was reduced.  Plan is to consider Vyvgart injections pending insurance review. He also has hypertension hyperlipidemia and diabetes mellitus, type II  Tony Combs was last seen on January 17, 2021.  He was having NYHA class I symptoms, euvolemic on exam.  Stable on Entresto, carvedilol and has been started on spironolactone with stable potassium levels.  He had an echocardiogram done that showed stable reduced EF of 30 to 35% with questionable PFO. => I ordered a bubble study, and continued aspirin (per recommendations, Plavix i was only written for 3 months). => As was my plan with initial evaluation of the hospital, I chose to evaluate for ischemia with a Coronary CTA  Recent Hospitalizations: None  Reviewed  CV studies:    The following studies were reviewed today: (if available, images/films reviewed: From Epic Chart or Care Everywhere) Coronary CT Angiogram 01/31/2021: Coronary calcium score 40.  No CAD RADS 3.  Moderate stenosis.  RCA has moderate obstructive plaque in the mid and distal vessel.  Otherwise 25-49% OM1. => CT FFR positive, 0.66 in the RCA. Echo 12/31/2020: EF 30 to 35%.  Mild LVH.  GR 1 DD.  Cannot exclude PFO. Echo with bubble 02/07/2021: EF 30 to 35%.  No evidence of shunting through possible  PFO.  Interval History:   Tony Combs returns here today doing okay.  He does not do a lot of walking.  Is somewhat limited as far as mobility, walking with a cane.  He does get short of breath walking upstairs, and occasionally has some left-sided chest pain but it seems to be at random times.  He actually started riding a stationary bicycle and says he is minimal to about 10 minutes at a reasonable pace without any significant shortness of breath or chest pain, but is scared to push himself further.  I did not get a sense as to whether or not he started CPAP or not, but he does not necessarily notice any significant PND orthopnea.  No signs or symptoms of irregular heartbeats or palpitations.  No syncope/near syncope or further TIA/amaurosis fugax symptoms. He does have weakness fatigue, and has somewhat limited mobility.   CV Review of Symptoms (Summary) Cardiovascular ROS: positive for - chest pain, dyspnea on exertion, and fatigue, exercise intolerance. negative for - edema, irregular heartbeat, orthopnea, palpitations, paroxysmal nocturnal dyspnea, rapid heart rate, shortness of breath, or syncope/near syncope or TIA/amaurosis fugax or recurrent CVA symptoms.  Claudication  REVIEWED OF SYSTEMS   Review of Systems  Constitutional:  Positive for malaise/fatigue. Negative for weight loss.  HENT:  Negative for congestion and nosebleeds.   Respiratory:  Negative for shortness of breath.   Cardiovascular:        Per HPI  Gastrointestinal:  Negative for blood in stool and melena.  Genitourinary:  Negative for hematuria.  Musculoskeletal:  Positive for joint pain and myalgias (Just mostly fatigue). Negative for falls.  Neurological:  Positive for dizziness and weakness. Negative for focal weakness.  Psychiatric/Behavioral:  Negative for depression and memory loss. The patient is not nervous/anxious and does not have insomnia.   --> He says the cramping and diarrhea has improved presents  cutting the dose of Mestinon in half.  I have reviewed and (if needed) personally updated the patient's problem list, medications, allergies, past medical and surgical history, social and family history.   PAST MEDICAL HISTORY   Past Medical History:  Diagnosis Date   Diabetes mellitus without complication ([place])    Dilated cardiomyopathy ([place]) 01/17/2021   In setting of stroke: TTE 08/31/2020: EF 30 and 35%.  Mildly decreased function.  Moderate LVH.  Unable to assess diastolic function.  Unable assess RV function.  Mild LA dilation.   Hyperlipidemia    Hypertension    Myasthenia gravis ([place]) 03/01/2021   Followed by Dr. Robyne Askew half dose Mestinon  Stroke ([place]) 08/30/2020   Right frontal lobe stroke with dysarthria and blurred vision.    PAST SURGICAL HISTORY   Past Surgical History:  Procedure Laterality Date   Coronary CT Angiogram  01/31/2021   Coronary calcium score 40.  No CAD RADS 3.  Moderate stenosis.  RCA has moderate obstructive plaque in the mid and distal vessel.  Otherwise 25-49% OM1. => CT FFR positive, 0.66 in the RCA.   HERNIA REPAIR     TRANSTHORACIC ECHOCARDIOGRAM  08/31/2020   a) EF 30 and 35%.  Mildly decreased function.  Moderate LVH.  Unable to assess diastolic function.  Unable assess RV function.  Mild LA dilation.; b) 12/21/2020: EF remains 30 to 35% with moderately reduced function.  Global HK.  GR 1 DD.  Normal RV size and function.  Normal aortic valves.  Cannot exclude small PFO. => 02/07/21 -> NEGATIVE BUBBLE STUDY.     There is no immunization history on file for this patient.  MEDICATIONS/ALLERGIES   Current Meds  Medication Sig   aspirin EC 81 MG tablet Take 81 mg by mouth daily. Swallow whole.   atorvastatin (LIPITOR) 80 MG tablet Take 1 tablet (80 mg total) by mouth daily.   B-D UF III MINI PEN NEEDLES 31G X 5 MM MISC SMARTSIG:1 Pre-Filled Pen Syringe SUB-Q 4 Times Daily   blood glucose meter kit and supplies Dispense based on patient and  insurance preference. Use up to four times daily as directed. (FOR ICD-10 E10.9, E11.9).   carvedilol (COREG) 6.25 MG tablet Take 1 tablet (6.25 mg total) by mouth 2 (two) times daily with a meal.   Continuous Blood Gluc Sensor (FREESTYLE LIBRE 3 SENSOR) MISC Inject 1 applicator into the skin every 14 (fourteen) days.   efgartigimod alfa-fcab (VYVGART) 400 MG/20ML injection Inject 1,000 mg into the vein every 30 (thirty) days.   insulin glargine, 1 Unit Dial, (TOUJEO) 300 UNIT/ML Solostar Pen Inject 20 Units into the skin at bedtime.   Insulin Pen Needle (PEN NEEDLES) 30G X 5 MM MISC 1 pen by Does not apply route 4 (four) times daily - after meals and at bedtime.   NOVOLOG FLEXPEN 100 UNIT/ML FlexPen Inject 4-7 Units into the skin 3 (three) times daily with meals.   pyridostigmine (MESTINON) 60 MG tablet Take 0.5 tablets (30 mg total) by mouth 3 (three) times daily. Take 0.5 tablets 3 times daily for 1 week and then 1 tablet 3 times daily if tolerated   spironolactone (ALDACTONE) 25 MG tablet Take 1 tablet (25 mg total) by mouth daily.   [DISCONTINUED] sacubitril-valsartan (ENTRESTO) 49-51 MG Take 1 tablet by mouth 2 (two) times daily.    No Known Allergies  SOCIAL HISTORY/FAMILY HISTORY   Reviewed in Epic:  Pertinent findings:  Social History   Tobacco Use   Smoking status: Never   Smokeless tobacco: Never  Vaping Use   Vaping Use: Never used  Substance Use Topics   Alcohol use: Not Currently   Drug use: Not Currently   Social History   Social History Narrative   Not on file    OBJCTIVE -PE, EKG, labs   Wt Readings from Last 3 Encounters:  03/01/21 225 lb (102.1 kg)  02/26/21 223 lb 8 oz (101.4 kg)  02/19/21 222 lb 3.2 oz (100.8 kg)    Physical Exam: BP 138/74    Pulse 89    Ht 5' 8" (1.727 m)    Wt 225 lb (102.1 kg)    SpO2 96%  BMI 34.21 kg/m  Physical Exam Vitals reviewed.  Constitutional:      General: He is not in acute distress.    Appearance: Normal  appearance. He is obese. He is not ill-appearing or toxic-appearing.     Comments: Well-nourished, well-groomed.  Healthy-appearing.  HENT:     Head: Normocephalic and atraumatic.  Neck:     Vascular: No carotid bruit.  Cardiovascular:     Rate and Rhythm: Normal rate and regular rhythm.     Pulses: Normal pulses.     Heart sounds: No murmur heard.   No friction rub. No gallop.  Pulmonary:     Effort: Pulmonary effort is normal. No respiratory distress.     Breath sounds: Normal breath sounds. No wheezing or rales.  Chest:     Chest wall: No tenderness.  Musculoskeletal:        General: Swelling (Trivial ankle swelling) present.     Cervical back: Normal range of motion and neck supple.  Skin:    General: Skin is warm and dry.     Coloration: Skin is not pale.  Neurological:     Mental Status: He is alert and oriented to person, place, and time.     Cranial Nerves: No cranial nerve deficit.     Gait: Gait abnormal.     Comments: No focal cranial nerve defect  Psychiatric:        Thought Content: Thought content normal.        Judgment: Judgment normal.     Comments: Somewhat blunted/flat affect.  Mood is subdued.     Adult ECG Report Not checked  Recent Labs: Reviewed.  Due for lipid to be rechecked. Lab Results  Component Value Date   CHOL 143 08/31/2020   HDL 26 (L) 08/31/2020   LDLCALC 61 08/31/2020   TRIG 281 (H) 08/31/2020   CHOLHDL 5.5 08/31/2020   Lab Results  Component Value Date   CREATININE 0.74 (L) 01/17/2021   BUN 12 01/17/2021   NA 137 01/17/2021   K 4.6 01/17/2021   CL 98 01/17/2021   CO2 25 01/17/2021   CBC Latest Ref Rng & Units 09/02/2020 09/01/2020 08/31/2020  WBC 4.0 - 10.5 K/uL 8.2 7.5 7.6  Hemoglobin 13.0 - 17.0 g/dL 15.2 14.2 15.3  Hematocrit 39.0 - 52.0 % 45.4 44.5 46.2  Platelets 150 - 400 K/uL 202 229 219    Lab Results  Component Value Date   HGBA1C 7.1 (A) 02/19/2021   Lab Results  Component Value Date   TSH 0.787 09/01/2020     ==================================================  COVID-19 Education: The signs and symptoms of COVID-19 were discussed with the patient and how to seek care for testing (follow up with PCP or arrange E-visit).    I spent a total of 39 minutes with the patient spent in direct patient consultation.  Additional time spent with chart review  / charting (studies, outside notes, etc):  25 min Total Time: 64 min  Current medicines are reviewed at length with the patient today.  (+/- concerns) n/a  This visit occurred during the SARS-CoV-2 public health emergency.  Safety protocols were in place, including screening questions prior to the visit, additional usage of staff PPE, and extensive cleaning of exam room while observing appropriate contact time as indicated for disinfecting solutions.  Notice: This dictation was prepared with Dragon dictation along with smart phrase technology. Any transcriptional errors that result from this process are unintentional and may not be corrected upon review.  Studies Ordered:   Orders Placed This Encounter  Procedures   Basic metabolic panel   CBC   Shared Decision Making/Informed Consent The risks [stroke (1 in 1000), death (1 in 29), kidney failure [usually temporary] (1 in 500), bleeding (1 in 200), allergic reaction [possibly serious] (1 in 200)], benefits (diagnostic support and management of coronary artery disease) and alternatives of a cardiac catheterization were discussed in detail with Tony Combs and he is willing to proceed.   Patient Instructions / Medication Changes & Studies & Tests Ordered   Patient Instructions  Medication Instructions:     Continue with Delene Loll 49/51  -- follow this instruction unti we find out if wecan get medication authorized with insurance Take 1/2 tablet twice a day along with taking 2 tablets of  Carvedilol  6.25 mg  ( total 12.5 mg ) twice a day   If office is unable obtain Entresto - will switch  medication to Valsartan 160  mg  *If you need a refill on your cardiac medications before your next appointment, please call your pharmacy*   Lab Work: Bmp Cbc - today  If you have labs (blood work) drawn today and your tests are completely normal, you will receive your results only by: MyChart Message (if you have MyChart) OR A paper copy in the mail If you have any lab test that is abnormal or we need to change your treatment, we will call you to review the results.   Testing/Procedures:  Schedule cardiac cath  Mar 11, 2021 Your physician has requested that you have a cardiac catheterization. Cardiac catheterization is used to diagnose and/or treat various heart conditions. Doctors may recommend this procedure for a number of different reasons. The most common reason is to evaluate chest pain. Chest pain can be a symptom of coronary artery disease (CAD), and cardiac catheterization can show whether plaque is narrowing or blocking your hearts arteries. This procedure is also used to evaluate the valves, as well as measure the blood flow and oxygen levels in different parts of your heart. For further information please visit [email or web address]. Please follow instruction sheet, as given.    Follow-Up: At [facility], you and your health needs are our priority.  As part of our continuing mission to provide you with exceptional heart care, we have created designated Provider Care Teams.  These Care Teams include your primary Cardiologist (physician) and Advanced Practice Providers (APPs -  Physician Assistants and Nurse Practitioners) who all work together to provide you with the care you need, when you need it.  We recommend signing up for the patient portal called "MyChart".  Sign up information is provided on this After Visit Summary.  MyChart is used to connect with patients for Virtual Visits (Telemedicine).  Patients are able to view lab/test results, encounter notes, upcoming  appointments, etc.  Non-urgent messages can be sent to your provider as well.   To learn more about what you can do with MyChart, go to [email or web address].    Your next appointment:   3 month(s) Keep appointment  The format for your next appointment:   In Person  Provider:   [name], MD     Other Instructions   [place] [address] Dept: [phone] Loc: [place]  03/01/2021  You are scheduled for a Cardiac Catheterization on Monday, January 23 with Dr. [name].  1. Please arrive at  the [employer] (Main Entrance A) at [facility]: [address] [place], [place] [address] at 5:30 AM (This time is two hours before your procedure to ensure your preparation). Free valet parking service is available.   Special note: Every effort is made to have your procedure done on time. Please understand that emergencies sometimes delay scheduled procedures.  2. Diet: Do not eat solid foods after midnight.  The patient may have clear liquids until 5am upon the day of the procedure.  3. Labs: You will need to have blood drawn  CBC,BMP on Friday, January 13 at [place], [place]  Open: 8am - 5pm (Lunch 12:30 - 1:30)   Phone: [phone]. You do not need to be fasting.  4. Medication instructions in preparation for your procedure:   Contrast Allergy: No   Take only  2-4 units of insulin the night before your procedure. Do not take any insulin on the day of the procedure.    On the morning of your procedure, take your Aspirin 64m  and any morning medicines NOT listed above.  You may use sips of water.  5. Plan for one night stay--bring personal belongings. 6. Bring a current list of your medications and current insurance cards. 7. You MUST have a responsible person to drive you home. 8. Someone MUST  be with you the first 24 hours after you arrive home or your discharge will be delayed. 9. Please wear clothes that are easy to get on and off and wear slip-on shoes.  Thank you for allowing [facility] for you!   -- [facility]       DGlenetta Hew M.D., M.S. Interventional Cardiologist   Pager # [phone] # [phone] [place] [address]  Thank you for choosing [facility] at [facility]!

## 2021-03-01 NOTE — Assessment & Plan Note (Signed)
Far enough out to be safe to do cardiac catheterization. On aspirin and high-dose statin Will be on Plavix if PCI performed.

## 2021-03-01 NOTE — Assessment & Plan Note (Addendum)
Has pretty much been euvolemic with class I-II symptoms mostly just exertional dyspnea.  Unfortunately EF did not improve.  On stable dose of Entresto and carvedilol, unfortunately with him not be able to continue Entresto and less he has patient assistance, we may have to convert to valsartan. For now we are going to cut Entresto dose in half to stretch out what he has and increase carvedilol dose to 12.5 mg twice daily. Also on spironolactone, not yet on SGLT2 inhibitor.  (Will need cost analysis)  No diuretic requirement besides spironolactone

## 2021-03-01 NOTE — Assessment & Plan Note (Addendum)
On 80 mg atorvastatin.  Labs as of July showed LDL 61.  I suspect that he was to have labs checked prior to now, but since they have not yet been done, will draw when he goes for heart catheterization.  Continue 80 mg Lipitor given severe findings on coronary CTA.  He is now on insulin.  Pretty much because of cost concerns with using Entresto and now Vyvart, I am reluctant to recommend SGLT2 inhibitor.  However, in the future would like to add either [place] or [place].Marland Kitchen

## 2021-03-01 NOTE — Patient Instructions (Addendum)
Medication Instructions:     Continue with Tony Combs 49/51  -- follow this instruction unti we find out if wecan get medication authorized with insurance Take 1/2 tablet twice a day along with taking 2 tablets of  Carvedilol  6.25 mg  ( total 12.5 mg ) twice a day   If office is unable obtain Entresto - will switch medication to Valsartan 160  mg  *If you need a refill on your cardiac medications before your next appointment, please call your pharmacy*   Lab Work: Bmp Cbc - today  If you have labs (blood work) drawn today and your tests are completely normal, you will receive your results only by: MyChart Message (if you have MyChart) OR A paper copy in the mail If you have any lab test that is abnormal or we need to change your treatment, we will call you to review the results.   Testing/Procedures:  Schedule cardiac cath  Mar 11, 2021 Your physician has requested that you have a cardiac catheterization. Cardiac catheterization is used to diagnose and/or treat various heart conditions. Doctors may recommend this procedure for a number of different reasons. The most common reason is to evaluate chest pain. Chest pain can be a symptom of coronary artery disease (CAD), and cardiac catheterization can show whether plaque is narrowing or blocking your hearts arteries. This procedure is also used to evaluate the valves, as well as measure the blood flow and oxygen levels in different parts of your heart. For further information please visit [email or web address]. Please follow instruction sheet, as given.    Follow-Up: At [facility], you and your health needs are our priority.  As part of our continuing mission to provide you with exceptional heart care, we have created designated Provider Care Teams.  These Care Teams include your primary Cardiologist (physician) and Advanced Practice Providers (APPs -  Physician Assistants and Nurse Practitioners) who all work together to provide you with the  care you need, when you need it.  We recommend signing up for the patient portal called "MyChart".  Sign up information is provided on this After Visit Summary.  MyChart is used to connect with patients for Virtual Visits (Telemedicine).  Patients are able to view lab/test results, encounter notes, upcoming appointments, etc.  Non-urgent messages can be sent to your provider as well.   To learn more about what you can do with MyChart, go to [email or web address].    Your next appointment:   3 month(s) Keep appointment  The format for your next appointment:   In Person  Provider:   [name], MD     Other Instructions   Tony Combs [place] [address] Dept: [phone] Loc: [place]  03/01/2021  You are scheduled for a Cardiac Catheterization on Monday, January 23 with Dr. [name].  1. Please arrive at the [facility] (Main Entrance A) at [facility]: [address] [place], [place] [address] at 5:30 AM (This time is two hours before your procedure to ensure your preparation). Free valet parking service is available.   Special note: Every effort is made to have your procedure done on time. Please understand that emergencies sometimes delay scheduled procedures.  2. Diet: Do not eat solid foods after midnight.  The patient may have clear liquids until 5am upon the day of the procedure.  3. Labs: You will need to have blood drawn  CBC,BMP  on Friday, January 13 at [place]  Open: [place] (Lunch 12:30 - 1:30)   Phone: [phone]. You do not need to be fasting.  4. Medication instructions in preparation for your procedure:   Contrast Allergy: No   Take only  2-4 units of insulin the night before your procedure. Do not take any insulin on the day of the procedure.    On the morning of your  procedure, take your Aspirin 81mg   and any morning medicines NOT listed above.  You may use sips of water.  5. Plan for one night stay--bring personal belongings. 6. Bring a current list of your medications and current insurance cards. 7. You MUST have a responsible person to drive you home. 8. Someone MUST be with you the first 24 hours after you arrive home or your discharge will be delayed. 9. Please wear clothes that are easy to get on and off and wear slip-on shoes.  Thank you for allowing [place] to care for you!   -- [facility]

## 2021-03-01 NOTE — Assessment & Plan Note (Addendum)
Significantly positive CT FFR findings on what appears to be a codominant large RCA.  The CTA Heart Flow diagram does not make the RCA look rather large.  However it was read as a large vessel.  In light of the reduced EF, and evidence of CT FFR positive lesion of the RCA, I do think it is reasonable to proceed with cardiac catheterization and possible PCI.  Plan:  We will schedule LEFT HEART CATHETERIZATION WITH CORONARY ANGIOGRAPHY AND POSSIBLE PERCUTANEOUS CORONARY INTERVENTION for March 11, 2021  Increase carvedilol to 12.5 mg twice daily  Continue 80 mg atorvastatin, check lipid panel in Cath Lab once we obtain access.  Continue aspirin

## 2021-03-01 NOTE — Progress Notes (Signed)
° °Primary Care Provider: Burdine, Steven E, MD °Cardiologist: Nyle Limb, MD °Neurologist: Dr. Pramod Sethi, °Electrophysiologist: None ° °Clinic Note: °Chief Complaint  °Patient presents with  ° Follow-up  °  1 month follow-up after echo and Coronary CTA.  ° Congestive Heart Failure  °  Cardiomyopathy on echo.  He does have some exertional dyspnea  ° Coronary Artery Disease  °  Abnormal findings on Coronary CTA, occasional chest pain  ° °=================================== ° °ASSESSMENT/PLAN  ° °Problem List Items Addressed This Visit   ° °  ° Cardiology Problems  ° Dilated cardiomyopathy (HCC) (Chronic)  °  Still not sure of the etiology.  I do not think single-vessel CAD as the cause, but with positive CT FFR, we will proceed with cardiac catheterization. °He does seem euvolemic on exam with class II CHF symptoms based on simply some exertional dyspnea.  No PND orthopnea. ° °Unfortunately, his EF did not improve with beta-blocker and Entresto. ° °Plan: °Proceed with cardiac catheterization with PCI.  ->  If LVEDP is elevated, would do right heart cath °We can then reassess EF in 3 months after PCI.  If still reduced, would refer for ICD. °Continue to try to get Entresto.  Stretch out existing Entresto by reduced to 1/2 tablet twice daily and increasing carvedilol to 12.5 mg twice daily °We do have a plan to switch to valsartan 160 mg daily if for what ever reason he is not able to get patient assistance to continue Entresto °Continue spironolactone °No diuretic requirement other than spironolactone at this point. °We will ask inpatient cardiology pharmacist to do cost analysis for SGLT2 inhibitor, and also potentially help with Entresto °  °  ° Relevant Medications  ° sacubitril-valsartan (ENTRESTO) 49-51 MG  ° Other Relevant Orders  ° Basic metabolic panel  ° CBC  ° Thoracic aortic atherosclerosis (HCC) (Chronic)  °  In addition to coronary artery disease seen on CT scan, there is also evidence of  atherosclerotic aortic disease. ° °Continue to titrate aggressive risk factor modification with blood pressure, lipid and glycemic control. ° °On stable regimen per CAD and hyperlipidemia sections. °  °  ° Relevant Medications  ° sacubitril-valsartan (ENTRESTO) 49-51 MG  ° Chronic combined systolic and diastolic heart failure (HCC) (Chronic)  °  Has pretty much been euvolemic with class I-II symptoms mostly just exertional dyspnea.  Unfortunately EF did not improve. ° °On stable dose of Entresto and carvedilol, unfortunately with him not be able to continue Entresto and less he has patient assistance, we may have to convert to valsartan. °For now we are going to cut Entresto dose in half to stretch out what he has and increase carvedilol dose to 12.5 mg twice daily. °Also on spironolactone, not yet on SGLT2 inhibitor.  (Will need cost analysis) ° °No diuretic requirement besides spironolactone °  °  ° Relevant Medications  ° sacubitril-valsartan (ENTRESTO) 49-51 MG  ° Acute CVA (cerebrovascular accident) (HCC) (Chronic)  °  Far enough out to be safe to do cardiac catheterization. °On aspirin and high-dose statin °Will be on Plavix if PCI performed. °  °  ° Relevant Medications  ° sacubitril-valsartan (ENTRESTO) 49-51 MG  ° Essential hypertension (Chronic)  °  His blood pressure is borderline elevated today.  Unfortunately, he is having issues with new insurance company not covering his Entresto. ° °For now we will see if we can stretch out his existing Entresto until he can potentially extend Entresto with patient assistance. °We will have   have him take 1/2 tablet twice daily Entresto, and increase carvedilol to 12.5 mg twice daily. Continue spironolactone  If were not able to obtain Entresto, will switch him to valsartan 160 mg daily.      Relevant Medications   sacubitril-valsartan (ENTRESTO) 49-51 MG   PFO (patent foramen ovale) (Chronic)    Suggested on echocardiogram, negative bubble study.      Relevant  Medications   sacubitril-valsartan (ENTRESTO) 49-51 MG   Hyperlipidemia associated with type 2 diabetes mellitus (HCC) (Chronic)    On 80 mg atorvastatin.  Labs as of July showed LDL 61.  I suspect that he was to have labs checked prior to now, but since they have not yet been done, will draw when he goes for heart catheterization.  Continue 80 mg Lipitor given severe findings on coronary CTA.  He is now on insulin.  Pretty much because of cost concerns with using Entresto and now Vyvart, I am reluctant to recommend SGLT2 inhibitor.  However, in the future would like to add either [place] or [place]..      Relevant Medications   sacubitril-valsartan (ENTRESTO) 49-51 MG     Other   Abnormal cardiac CT angiography - Primary    Significantly positive CT FFR findings on what appears to be a codominant large RCA.  The CTA Heart Flow diagram does not make the RCA look rather large.  However it was read as a large vessel.  In light of the reduced EF, and evidence of CT FFR positive lesion of the RCA, I do think it is reasonable to proceed with cardiac catheterization and possible PCI.  Plan: We will schedule LEFT HEART CATHETERIZATION WITH CORONARY ANGIOGRAPHY AND POSSIBLE PERCUTANEOUS CORONARY INTERVENTION for March 11, 2021 Increase carvedilol to 12.5 mg twice daily Continue 80 mg atorvastatin, check lipid panel in Cath Lab once we obtain access. Continue aspirin      Relevant Orders   Basic metabolic panel   CBC   Hyperkalemia (Chronic)    Stable on last check.  We will continue to monitor.      ===================================  HPI:    Tony Combs is a 61 y.o. male with a PMH Notable for Recently Diagnosed Dilated Cardiomyopathy (EF 30 to 35% with global HK) diagnosed in the setting of recent CVA on 08/24/2020, and OSA (not yet on CPAP) who presents today for 1 month follow-up to discuss results with Coronary CTA showing occlusive RCA disease and echocardiogram (with bubble  study).  08/24/2020: Right frontal lobe CVA-dysarthria and blurred vision with unilateral left leg weakness. => Echo showed EF 30 to 35%.  Plan was outpatient evaluation.  Was started on carvedilol and Entresto prior to discharge along with high-dose atorvastatin, aspirin and Plavix. Dilated CM => initial post hospital follow-up on 11/06/2020 with Almyra Deforest, PA -> Entresto titrated up to 49 to 51 mg daily.  Spironolactone delayed because of concerns of hyperkalemia.  Plan was echo follow-up in 3 months. OSA had been randomized to CPAP treatment arm of the "Sleep Smart Study " He was also recently diagnosed with Myasthenia Gravis (Nov 2022)-he was started on Mestinon, but had nausea, dose was reduced.  Plan is to consider Vyvgart injections pending insurance review. He also has hypertension hyperlipidemia and diabetes mellitus, type II  CHRISTIAAN STREBECK was last seen on January 17, 2021.  He was having NYHA class I symptoms, euvolemic on exam.  Stable on Entresto, carvedilol and has been started on spironolactone with stable potassium levels.  He had an echocardiogram done that showed stable reduced EF of 30 to 35% with questionable PFO. => I ordered a bubble study, and continued aspirin (per recommendations, Plavix i was only written for 3 months). °=> As was my plan with initial evaluation of the hospital, I chose to evaluate for ischemia with a Coronary CTA ° °Recent Hospitalizations: None ° °Reviewed  CV studies:   ° °The following studies were reviewed today: (if available, images/films reviewed: From Epic Chart or Care Everywhere) °Coronary CT Angiogram 01/31/2021: Coronary calcium score 40.  No CAD RADS 3.  Moderate stenosis.  RCA has moderate obstructive plaque in the mid and distal vessel.  Otherwise 25-49% OM1. => CT FFR positive, 0.66 in the RCA. °Echo 12/31/2020: EF 30 to 35%.  Mild LVH.  GR 1 DD.  Cannot exclude PFO. °Echo with bubble 02/07/2021: EF 30 to 35%.  No evidence of shunting through possible  PFO. ° °Interval History:  ° °Asaf W Sawyers returns here today doing okay.  He does not do a lot of walking.  Is somewhat limited as far as mobility, walking with a cane.  He does get short of breath walking upstairs, and occasionally has some left-sided chest pain but it seems to be at random times.  He actually started riding a stationary bicycle and says he is minimal to about 10 minutes at a reasonable pace without any significant shortness of breath or chest pain, but is scared to push himself further.  I did not get a sense as to whether or not he started CPAP or not, but he does not necessarily notice any significant PND orthopnea.  No signs or symptoms of irregular heartbeats or palpitations.  No syncope/near syncope or further TIA/amaurosis fugax symptoms. °He does have weakness fatigue, and has somewhat limited mobility. ° ° °CV Review of Symptoms (Summary) °Cardiovascular ROS: positive for - chest pain, dyspnea on exertion, and fatigue, exercise intolerance. °negative for - edema, irregular heartbeat, orthopnea, palpitations, paroxysmal nocturnal dyspnea, rapid heart rate, shortness of breath, or syncope/near syncope or TIA/amaurosis fugax or recurrent CVA symptoms.  Claudication ° °REVIEWED OF SYSTEMS  ° °Review of Systems  °Constitutional:  Positive for malaise/fatigue. Negative for weight loss.  °HENT:  Negative for congestion and nosebleeds.   °Respiratory:  Negative for shortness of breath.   °Cardiovascular:   °     Per HPI  °Gastrointestinal:  Negative for blood in stool and melena.  °Genitourinary:  Negative for hematuria.  °Musculoskeletal:  Positive for joint pain and myalgias (Just mostly fatigue). Negative for falls.  °Neurological:  Positive for dizziness and weakness. Negative for focal weakness.  °Psychiatric/Behavioral:  Negative for depression and memory loss. The patient is not nervous/anxious and does not have insomnia.   °--> He says the cramping and diarrhea has improved presents  cutting the dose of Mestinon in half. ° °I have reviewed and (if needed) personally updated the patient's problem list, medications, allergies, past medical and surgical history, social and family history.  ° °PAST MEDICAL HISTORY  ° °Past Medical History:  °Diagnosis Date  ° Diabetes mellitus without complication (HCC)   ° Dilated cardiomyopathy (HCC) 01/17/2021  ° In setting of stroke: TTE 08/31/2020: EF 30 and 35%.  Mildly decreased function.  Moderate LVH.  Unable to assess diastolic function.  Unable assess RV function.  Mild LA dilation.  ° Hyperlipidemia   ° Hypertension   ° Myasthenia gravis (HCC) 03/01/2021  ° Followed by Dr. Sethi-on half dose Mestinon  °   Stroke ([place]) 08/30/2020   Right frontal lobe stroke with dysarthria and blurred vision.    PAST SURGICAL HISTORY   Past Surgical History:  Procedure Laterality Date   Coronary CT Angiogram  01/31/2021   Coronary calcium score 40.  No CAD RADS 3.  Moderate stenosis.  RCA has moderate obstructive plaque in the mid and distal vessel.  Otherwise 25-49% OM1. => CT FFR positive, 0.66 in the RCA.   HERNIA REPAIR     TRANSTHORACIC ECHOCARDIOGRAM  08/31/2020   a) EF 30 and 35%.  Mildly decreased function.  Moderate LVH.  Unable to assess diastolic function.  Unable assess RV function.  Mild LA dilation.; b) 12/21/2020: EF remains 30 to 35% with moderately reduced function.  Global HK.  GR 1 DD.  Normal RV size and function.  Normal aortic valves.  Cannot exclude small PFO. => 02/07/21 -> NEGATIVE BUBBLE STUDY.     There is no immunization history on file for this patient.  MEDICATIONS/ALLERGIES   Current Meds  Medication Sig   aspirin EC 81 MG tablet Take 81 mg by mouth daily. Swallow whole.   atorvastatin (LIPITOR) 80 MG tablet Take 1 tablet (80 mg total) by mouth daily.   B-D UF III MINI PEN NEEDLES 31G X 5 MM MISC SMARTSIG:1 Pre-Filled Pen Syringe SUB-Q 4 Times Daily   blood glucose meter kit and supplies Dispense based on patient and  insurance preference. Use up to four times daily as directed. (FOR ICD-10 E10.9, E11.9).   carvedilol (COREG) 6.25 MG tablet Take 1 tablet (6.25 mg total) by mouth 2 (two) times daily with a meal.   Continuous Blood Gluc Sensor (FREESTYLE LIBRE 3 SENSOR) MISC Inject 1 applicator into the skin every 14 (fourteen) days.   efgartigimod alfa-fcab (VYVGART) 400 MG/20ML injection Inject 1,000 mg into the vein every 30 (thirty) days.   insulin glargine, 1 Unit Dial, (TOUJEO) 300 UNIT/ML Solostar Pen Inject 20 Units into the skin at bedtime.   Insulin Pen Needle (PEN NEEDLES) 30G X 5 MM MISC 1 pen by Does not apply route 4 (four) times daily - after meals and at bedtime.   NOVOLOG FLEXPEN 100 UNIT/ML FlexPen Inject 4-7 Units into the skin 3 (three) times daily with meals.   pyridostigmine (MESTINON) 60 MG tablet Take 0.5 tablets (30 mg total) by mouth 3 (three) times daily. Take 0.5 tablets 3 times daily for 1 week and then 1 tablet 3 times daily if tolerated   spironolactone (ALDACTONE) 25 MG tablet Take 1 tablet (25 mg total) by mouth daily.   [DISCONTINUED] sacubitril-valsartan (ENTRESTO) 49-51 MG Take 1 tablet by mouth 2 (two) times daily.    No Known Allergies  SOCIAL HISTORY/FAMILY HISTORY   Reviewed in Epic:  Pertinent findings:  Social History   Tobacco Use   Smoking status: Never   Smokeless tobacco: Never  Vaping Use   Vaping Use: Never used  Substance Use Topics   Alcohol use: Not Currently   Drug use: Not Currently   Social History   Social History Narrative   Not on file    OBJCTIVE -PE, EKG, labs   Wt Readings from Last 3 Encounters:  03/01/21 225 lb (102.1 kg)  02/26/21 223 lb 8 oz (101.4 kg)  02/19/21 222 lb 3.2 oz (100.8 kg)    Physical Exam: BP 138/74    Pulse 89    Ht 5' 8" (1.727 m)    Wt 225 lb (102.1 kg)    SpO2 96%  BMI 34.21 kg/m²  °Physical Exam °Vitals reviewed.  °Constitutional:   °   General: He is not in acute distress. °   Appearance: Normal  appearance. He is obese. He is not ill-appearing or toxic-appearing.  °   Comments: Well-nourished, well-groomed.  Healthy-appearing.  °HENT:  °   Head: Normocephalic and atraumatic.  °Neck:  °   Vascular: No carotid bruit.  °Cardiovascular:  °   Rate and Rhythm: Normal rate and regular rhythm.  °   Pulses: Normal pulses.  °   Heart sounds: No murmur heard. °  No friction rub. No gallop.  °Pulmonary:  °   Effort: Pulmonary effort is normal. No respiratory distress.  °   Breath sounds: Normal breath sounds. No wheezing or rales.  °Chest:  °   Chest wall: No tenderness.  °Musculoskeletal:     °   General: Swelling (Trivial ankle swelling) present.  °   Cervical back: Normal range of motion and neck supple.  °Skin: °   General: Skin is warm and dry.  °   Coloration: Skin is not pale.  °Neurological:  °   Mental Status: He is alert and oriented to person, place, and time.  °   Cranial Nerves: No cranial nerve deficit.  °   Gait: Gait abnormal.  °   Comments: No focal cranial nerve defect  °Psychiatric:     °   Thought Content: Thought content normal.     °   Judgment: Judgment normal.  °   Comments: Somewhat blunted/flat affect.  Mood is subdued.  ° ° ° Adult ECG Report °Not checked ° °Recent Labs: Reviewed.  Due for lipid to be rechecked. °Lab Results  °Component Value Date  ° CHOL 143 08/31/2020  ° HDL 26 (L) 08/31/2020  ° LDLCALC 61 08/31/2020  ° TRIG 281 (H) 08/31/2020  ° CHOLHDL 5.5 08/31/2020  ° °Lab Results  °Component Value Date  ° CREATININE 0.74 (L) 01/17/2021  ° BUN 12 01/17/2021  ° NA 137 01/17/2021  ° K 4.6 01/17/2021  ° CL 98 01/17/2021  ° CO2 25 01/17/2021  ° °CBC Latest Ref Rng & Units 09/02/2020 09/01/2020 08/31/2020  °WBC 4.0 - 10.5 K/uL 8.2 7.5 7.6  °Hemoglobin 13.0 - 17.0 g/dL 15.2 14.2 15.3  °Hematocrit 39.0 - 52.0 % 45.4 44.5 46.2  °Platelets 150 - 400 K/uL 202 229 219  ° ° °Lab Results  °Component Value Date  ° HGBA1C 7.1 (A) 02/19/2021  ° °Lab Results  °Component Value Date  ° TSH 0.787 09/01/2020   ° ° °================================================== ° °COVID-19 Education: °The signs and symptoms of COVID-19 were discussed with the patient and how to seek care for testing (follow up with PCP or arrange E-visit).   ° °I spent a total of 39 minutes with the patient spent in direct patient consultation.  °Additional time spent with chart review  / charting (studies, outside notes, etc):  25 min °Total Time: 64 min ° °Current medicines are reviewed at length with the patient today.  (+/- concerns) n/a ° °This visit occurred during the SARS-CoV-2 public health emergency.  Safety protocols were in place, including screening questions prior to the visit, additional usage of staff PPE, and extensive cleaning of exam room while observing appropriate contact time as indicated for disinfecting solutions. ° °Notice: This dictation was prepared with Dragon dictation along with smart phrase technology. Any transcriptional errors that result from this process are unintentional and may not be corrected upon review. ° °  Studies Ordered:   Orders Placed This Encounter  Procedures   Basic metabolic panel   CBC   Shared Decision Making/Informed Consent The risks [stroke (1 in 1000), death (1 in 54), kidney failure [usually temporary] (1 in 500), bleeding (1 in 200), allergic reaction [possibly serious] (1 in 200)], benefits (diagnostic support and management of coronary artery disease) and alternatives of a cardiac catheterization were discussed in detail with Mr. Cauthorn and he is willing to proceed.   Patient Instructions / Medication Changes & Studies & Tests Ordered   Patient Instructions  Medication Instructions:     Continue with Delene Loll 49/51  -- follow this instruction unti we find out if wecan get medication authorized with insurance Take 1/2 tablet twice a day along with taking 2 tablets of  Carvedilol  6.25 mg  ( total 12.5 mg ) twice a day   If office is unable obtain Entresto - will switch  medication to Valsartan 160  mg  *If you need a refill on your cardiac medications before your next appointment, please call your pharmacy*   Lab Work: Bmp Cbc - today  If you have labs (blood work) drawn today and your tests are completely normal, you will receive your results only by: MyChart Message (if you have MyChart) OR A paper copy in the mail If you have any lab test that is abnormal or we need to change your treatment, we will call you to review the results.   Testing/Procedures:  Schedule cardiac cath  Mar 11, 2021 Your physician has requested that you have a cardiac catheterization. Cardiac catheterization is used to diagnose and/or treat various heart conditions. Doctors may recommend this procedure for a number of different reasons. The most common reason is to evaluate chest pain. Chest pain can be a symptom of coronary artery disease (CAD), and cardiac catheterization can show whether plaque is narrowing or blocking your hearts arteries. This procedure is also used to evaluate the valves, as well as measure the blood flow and oxygen levels in different parts of your heart. For further information please visit [email or web address]. Please follow instruction sheet, as given.    Follow-Up: At [facility], you and your health needs are our priority.  As part of our continuing mission to provide you with exceptional heart care, we have created designated Provider Care Teams.  These Care Teams include your primary Cardiologist (physician) and Advanced Practice Providers (APPs -  Physician Assistants and Nurse Practitioners) who all work together to provide you with the care you need, when you need it.  We recommend signing up for the patient portal called "MyChart".  Sign up information is provided on this After Visit Summary.  MyChart is used to connect with patients for Virtual Visits (Telemedicine).  Patients are able to view lab/test results, encounter notes, upcoming  appointments, etc.  Non-urgent messages can be sent to your provider as well.   To learn more about what you can do with MyChart, go to [email or web address].    Your next appointment:   3 month(s) Keep appointment  The format for your next appointment:   In Person  Provider:   [name], MD     Other Instructions   [place] [address] [place] [address] Dept: [phone] Loc: [place]  03/01/2021  You are scheduled for a Cardiac Catheterization on Monday, January 23 with Dr. [name].  1. Please arrive at  the [facility] (Main Entrance A) at [facility]: [address] [place], [place] [address] at 5:30 AM (This time is two hours before your procedure to ensure your preparation). Free valet parking service is available.  ° °Special note: Every effort is made to have your procedure done on time. Please understand that emergencies sometimes delay scheduled procedures. ° °2. Diet: Do not eat solid foods after midnight.  The patient may have clear liquids until 5am upon the day of the procedure. ° °3. Labs: You will need to have blood drawn  CBC,BMP on Friday, January 13 at [facility] [address], [place]  °Open: 8am - 5pm (Lunch 12:30 - 1:30)   Phone: [phone]. You do not need to be fasting. ° °4. Medication instructions in preparation for your procedure: ° ° Contrast Allergy: No ° ° °Take only  2-4 units of insulin the night before your procedure. Do not take any insulin on the day of the procedure. ° ° ° °On the morning of your procedure, take your Aspirin 81mg  and any morning medicines NOT listed above.  You may use sips of water. ° °5. Plan for one night stay--bring personal belongings. °6. Bring a current list of your medications and current insurance cards. °7. You MUST have a responsible person to drive you home. °8. Someone MUST  be with you the first 24 hours after you arrive home or your discharge will be delayed. °9. Please wear clothes that are easy to get on and off and wear slip-on shoes. ° °Thank you for allowing us to care for you! °  -- [facility]   ° ° ° ° °Victorian Gunn, M.D., M.S. °Interventional Cardiologist  ° °Pager # [phone] °Phone # [phone] °[address] °[place], [place] [address] ° ° °Thank you for choosing [facility]!! °  ° °

## 2021-03-01 NOTE — Assessment & Plan Note (Signed)
Stable on last check.  We will continue to monitor.

## 2021-03-01 NOTE — Assessment & Plan Note (Signed)
In addition to coronary artery disease seen on CT scan, there is also evidence of atherosclerotic aortic disease.  Continue to titrate aggressive risk factor modification with blood pressure, lipid and glycemic control.  On stable regimen per CAD and hyperlipidemia sections.

## 2021-03-01 NOTE — Assessment & Plan Note (Signed)
Suggested on echocardiogram, negative bubble study.

## 2021-03-01 NOTE — Assessment & Plan Note (Signed)
Still not sure of the etiology.  I do not think single-vessel CAD as the cause, but with positive CT FFR, we will proceed with cardiac catheterization. He does seem euvolemic on exam with class II CHF symptoms based on simply some exertional dyspnea.  No PND orthopnea.  Unfortunately, his EF did not improve with beta-blocker and Entresto.  Plan:  Proceed with cardiac catheterization with PCI.  ->  If LVEDP is elevated, would do right heart cath  We can then reassess EF in 3 months after PCI.  If still reduced, would refer for ICD.  Continue to try to get Entresto.  Stretch out existing Entresto by reduced to 1/2 tablet twice daily and increasing carvedilol to 12.5 mg twice daily  We do have a plan to switch to valsartan 160 mg daily if for what ever reason he is not able to get patient assistance to continue Entresto  Continue spironolactone  No diuretic requirement other than spironolactone at this point.  We will ask inpatient cardiology pharmacist to do cost analysis for SGLT2 inhibitor, and also potentially help with [facility]

## 2021-03-02 LAB — BASIC METABOLIC PANEL
BUN/Creatinine Ratio: 16 (ref 10–24)
BUN: 11 mg/dL (ref 8–27)
CO2: 23 mmol/L (ref 20–29)
Calcium: 8.8 mg/dL (ref 8.6–10.2)
Chloride: 102 mmol/L (ref 96–106)
Creatinine, Ser: 0.69 mg/dL — ABNORMAL LOW (ref 0.76–1.27)
Glucose: 114 mg/dL — ABNORMAL HIGH (ref 70–99)
Potassium: 4.5 mmol/L (ref 3.5–5.2)
Sodium: 141 mmol/L (ref 134–144)
eGFR: 105 mL/min/{1.73_m2} (ref >59)

## 2021-03-02 LAB — CBC
Hematocrit: 39.6 % (ref 37.5–51.0)
Hemoglobin: 13.4 g/dL (ref 13.0–17.7)
MCH: 29 pg (ref 26.6–33.0)
MCHC: 33.8 g/dL (ref 31.5–35.7)
MCV: 86 fL (ref 79–97)
Platelets: 221 10*3/uL (ref 150–450)
RBC: 4.62 x10E6/uL (ref 4.14–5.80)
RDW: 13 % (ref 11.6–15.4)
WBC: 6.9 10*3/uL (ref 3.4–10.8)

## 2021-03-06 ENCOUNTER — Other Ambulatory Visit: Payer: Self-pay | Admitting: *Deleted

## 2021-03-06 DIAGNOSIS — R931 Abnormal findings on diagnostic imaging of heart and coronary circulation: Secondary | ICD-10-CM

## 2021-03-06 DIAGNOSIS — I5042 Chronic combined systolic (congestive) and diastolic (congestive) heart failure: Secondary | ICD-10-CM

## 2021-03-06 NOTE — Progress Notes (Signed)
Order placed for left heart cath 

## 2021-03-07 ENCOUNTER — Telehealth: Payer: Self-pay | Admitting: *Deleted

## 2021-03-07 NOTE — Telephone Encounter (Addendum)
Cardiac catheterization scheduled at [facility] for: Monday March 11, 2021 7:30 AM [facility] Main Entrance A [facility]) at: 5:30 AM   Diet-nothing to eat or drink after midnight, may have sips of water to take medications.  Medication instructions for procedure: -Hold:  Insulin AM of procedure/1/2 usual Insulin dose HS prior to procedure  Spironolactone-AM of procedure -Except hold medications usual morning medications can be taken pre-cath with sips of water including aspirin 81 mg.    Confirmed patient has responsible adult to drive home post procedure and be with patient first 24 hours after arriving home.  [facility] does allow one visitor to accompany you and wait in the hospital waiting room while you are there for your procedure. You and your visitor will be asked to wear a mask once you enter the hospital.   Patient reports does not currently have any new symptoms concerning for COVID-19 and no household members with COVID-19 like illness.     Reviewed procedure/mask/visitor instructions with patient.

## 2021-03-11 ENCOUNTER — Encounter (HOSPITAL_COMMUNITY): Payer: Self-pay | Admitting: Cardiology

## 2021-03-11 ENCOUNTER — Other Ambulatory Visit: Payer: Self-pay

## 2021-03-11 ENCOUNTER — Other Ambulatory Visit (HOSPITAL_COMMUNITY): Payer: Self-pay

## 2021-03-11 ENCOUNTER — Encounter (HOSPITAL_COMMUNITY)
Admission: RE | Disposition: A | Discharge status: Home / Self Care | Payer: Self-pay | Source: Ambulatory Visit | Attending: Cardiology

## 2021-03-11 ENCOUNTER — Ambulatory Visit (HOSPITAL_COMMUNITY)
Admission: RE | Admit: 2021-03-11 | Discharge: 2021-03-11 | Disposition: A | Discharge status: Home / Self Care | Payer: 59 | Source: Ambulatory Visit | Attending: Cardiology | Admitting: Cardiology

## 2021-03-11 DIAGNOSIS — R0609 Other forms of dyspnea: Secondary | ICD-10-CM | POA: Insufficient documentation

## 2021-03-11 DIAGNOSIS — G4733 Obstructive sleep apnea (adult) (pediatric): Secondary | ICD-10-CM | POA: Diagnosis not present

## 2021-03-11 DIAGNOSIS — I110 Hypertensive heart disease with heart failure: Secondary | ICD-10-CM | POA: Insufficient documentation

## 2021-03-11 DIAGNOSIS — R931 Abnormal findings on diagnostic imaging of heart and coronary circulation: Secondary | ICD-10-CM | POA: Diagnosis not present

## 2021-03-11 DIAGNOSIS — I420 Dilated cardiomyopathy: Secondary | ICD-10-CM | POA: Diagnosis not present

## 2021-03-11 DIAGNOSIS — I2510 Atherosclerotic heart disease of native coronary artery without angina pectoris: Secondary | ICD-10-CM

## 2021-03-11 DIAGNOSIS — Z794 Long term (current) use of insulin: Secondary | ICD-10-CM | POA: Insufficient documentation

## 2021-03-11 DIAGNOSIS — Z8673 Personal history of transient ischemic attack (TIA), and cerebral infarction without residual deficits: Secondary | ICD-10-CM | POA: Diagnosis not present

## 2021-03-11 DIAGNOSIS — Z79899 Other long term (current) drug therapy: Secondary | ICD-10-CM | POA: Insufficient documentation

## 2021-03-11 DIAGNOSIS — E785 Hyperlipidemia, unspecified: Secondary | ICD-10-CM | POA: Insufficient documentation

## 2021-03-11 DIAGNOSIS — Q2112 Patent foramen ovale: Secondary | ICD-10-CM | POA: Insufficient documentation

## 2021-03-11 DIAGNOSIS — Z7982 Long term (current) use of aspirin: Secondary | ICD-10-CM | POA: Diagnosis not present

## 2021-03-11 DIAGNOSIS — I700 Atherosclerosis of aorta: Secondary | ICD-10-CM | POA: Diagnosis not present

## 2021-03-11 DIAGNOSIS — E119 Type 2 diabetes mellitus without complications: Secondary | ICD-10-CM | POA: Insufficient documentation

## 2021-03-11 DIAGNOSIS — G7000 Myasthenia gravis without (acute) exacerbation: Secondary | ICD-10-CM | POA: Diagnosis not present

## 2021-03-11 DIAGNOSIS — E875 Hyperkalemia: Secondary | ICD-10-CM | POA: Insufficient documentation

## 2021-03-11 DIAGNOSIS — I5042 Chronic combined systolic (congestive) and diastolic (congestive) heart failure: Secondary | ICD-10-CM | POA: Diagnosis present

## 2021-03-11 HISTORY — DX: Atherosclerotic heart disease of native coronary artery without angina pectoris: I25.10

## 2021-03-11 HISTORY — PX: LEFT HEART CATH AND CORONARY ANGIOGRAPHY: CATH118249

## 2021-03-11 LAB — POCT I-STAT 7, (LYTES, BLD GAS, ICA,H+H)
Acid-Base Excess: 1 mmol/L (ref 0.0–2.0)
Bicarbonate: 26.7 mmol/L (ref 20.0–28.0)
Calcium, Ion: 1.2 mmol/L (ref 1.15–1.40)
HCT: 36 % — ABNORMAL LOW (ref 39.0–52.0)
Hemoglobin: 12.2 g/dL — ABNORMAL LOW (ref 13.0–17.0)
O2 Saturation: 99 %
Potassium: 4.3 mmol/L (ref 3.5–5.1)
Sodium: 141 mmol/L (ref 135–145)
TCO2: 28 mmol/L (ref 22–32)
pCO2 arterial: 48.2 mmHg — ABNORMAL HIGH (ref 32.0–48.0)
pH, Arterial: 7.351 (ref 7.350–7.450)
pO2, Arterial: 150 mmHg — ABNORMAL HIGH (ref 83.0–108.0)

## 2021-03-11 LAB — GLUCOSE, CAPILLARY
Glucose-Capillary: 138 mg/dL — ABNORMAL HIGH (ref 70–99)
Glucose-Capillary: 135 mg/dL — ABNORMAL HIGH (ref 70–99)

## 2021-03-11 LAB — LIPID PANEL
Cholesterol: 104 mg/dL (ref 0–200)
HDL: 28 mg/dL — ABNORMAL LOW (ref >40)
LDL Cholesterol: 55 mg/dL (ref 0–99)
Total CHOL/HDL Ratio: 3.7 RATIO
Triglycerides: 107 mg/dL (ref <150)
VLDL: 21 mg/dL (ref 0–40)

## 2021-03-11 SURGERY — LEFT HEART CATH AND CORONARY ANGIOGRAPHY
Anesthesia: LOCAL

## 2021-03-11 MED ORDER — HEPARIN (PORCINE) IN NACL 1000-0.9 UT/500ML-% IV SOLN
INTRAVENOUS | Status: AC
  Filled 2021-03-11: qty 1000

## 2021-03-11 MED ORDER — LIDOCAINE HCL (PF) 1 % IJ SOLN
INTRAMUSCULAR | Status: DC | PRN
  Administered 2021-03-11: 2 mL via INTRADERMAL

## 2021-03-11 MED ORDER — SODIUM CHLORIDE 0.9% FLUSH: 3.0000 mL | Freq: Two times a day (BID) | INTRAVENOUS | Status: DC

## 2021-03-11 MED ORDER — FENTANYL CITRATE (PF) 100 MCG/2ML IJ SOLN
INTRAMUSCULAR | Status: AC
  Filled 2021-03-11: qty 2

## 2021-03-11 MED ORDER — SODIUM CHLORIDE 0.9% FLUSH: 3.0000 mL | INTRAVENOUS | Status: DC | PRN

## 2021-03-11 MED ORDER — HEPARIN SODIUM (PORCINE) 1000 UNIT/ML IJ SOLN
INTRAMUSCULAR | Status: AC
  Filled 2021-03-11: qty 10

## 2021-03-11 MED ORDER — MIDAZOLAM HCL 2 MG/2ML IJ SOLN
INTRAMUSCULAR | Status: DC | PRN
  Administered 2021-03-11: 1 mg via INTRAVENOUS

## 2021-03-11 MED ORDER — IOHEXOL 350 MG/ML SOLN
INTRAVENOUS | Status: DC | PRN
  Administered 2021-03-11: 135 mL

## 2021-03-11 MED ORDER — HYDRALAZINE HCL 20 MG/ML IJ SOLN: 10.0000 mg | INTRAMUSCULAR | Status: DC | PRN

## 2021-03-11 MED ORDER — MIDAZOLAM HCL 2 MG/2ML IJ SOLN
INTRAMUSCULAR | Status: AC
  Filled 2021-03-11: qty 2

## 2021-03-11 MED ORDER — LIDOCAINE HCL (PF) 1 % IJ SOLN
INTRAMUSCULAR | Status: AC
  Filled 2021-03-11: qty 30

## 2021-03-11 MED ORDER — SODIUM CHLORIDE 0.9 % IV SOLN: INTRAVENOUS | Status: DC

## 2021-03-11 MED ORDER — SODIUM CHLORIDE 0.9 % WEIGHT BASED INFUSION
3.0000 mL/kg/h | INTRAVENOUS | Status: AC
  Administered 2021-03-11: 3 mL/kg/h via INTRAVENOUS

## 2021-03-11 MED ORDER — VERAPAMIL HCL 2.5 MG/ML IV SOLN
INTRAVENOUS | Status: AC
  Filled 2021-03-11: qty 2

## 2021-03-11 MED ORDER — SODIUM CHLORIDE 0.9 % WEIGHT BASED INFUSION: 1.0000 mL/kg/h | INTRAVENOUS | Status: DC

## 2021-03-11 MED ORDER — HEPARIN (PORCINE) IN NACL 1000-0.9 UT/500ML-% IV SOLN
INTRAVENOUS | Status: DC | PRN
  Administered 2021-03-11 (×2): 500 mL

## 2021-03-11 MED ORDER — FENTANYL CITRATE (PF) 100 MCG/2ML IJ SOLN
INTRAMUSCULAR | Status: DC | PRN
  Administered 2021-03-11: 25 ug via INTRAVENOUS

## 2021-03-11 MED ORDER — HEPARIN SODIUM (PORCINE) 1000 UNIT/ML IJ SOLN
INTRAMUSCULAR | Status: DC | PRN
  Administered 2021-03-11: 5000 [IU] via INTRAVENOUS

## 2021-03-11 MED ORDER — CARVEDILOL 6.25 MG PO TABS: 12.5000 mg | ORAL_TABLET | Freq: Two times a day (BID) | ORAL | 0 refills | Status: DC

## 2021-03-11 MED ORDER — ACETAMINOPHEN 325 MG PO TABS: 650.0000 mg | ORAL_TABLET | ORAL | Status: DC | PRN

## 2021-03-11 MED ORDER — SODIUM CHLORIDE 0.9 % IV SOLN: 250.0000 mL | INTRAVENOUS | Status: DC | PRN

## 2021-03-11 MED ORDER — ONDANSETRON HCL 4 MG/2ML IJ SOLN: 4.0000 mg | Freq: Four times a day (QID) | INTRAMUSCULAR | Status: DC | PRN

## 2021-03-11 MED ORDER — VERAPAMIL HCL 2.5 MG/ML IV SOLN
INTRAVENOUS | Status: DC | PRN
  Administered 2021-03-11: 10 mL via INTRA_ARTERIAL

## 2021-03-11 MED ORDER — LABETALOL HCL 5 MG/ML IV SOLN: 10.0000 mg | INTRAVENOUS | Status: DC | PRN

## 2021-03-11 SURGICAL SUPPLY — 14 items
CATH INFINITI 5 FR 3DRC (CATHETERS) ×1 IMPLANT
CATH INFINITI 5 FR JL3.5 (CATHETERS) ×1 IMPLANT
CATH INFINITI 5FR AL1 (CATHETERS) ×1 IMPLANT
CATH INFINITI JR4 5F (CATHETERS) ×1 IMPLANT
CATH LAUNCHER 6FR AL.75 (CATHETERS) ×1 IMPLANT
CATH OPTITORQUE TIG 4.0 5F (CATHETERS) ×1 IMPLANT
DEVICE RAD COMP TR BAND LRG (VASCULAR PRODUCTS) ×1 IMPLANT
GLIDESHEATH SLEND SS 6F .021 (SHEATH) ×1 IMPLANT
GUIDEWIRE INQWIRE 1.5J.035X260 (WIRE) IMPLANT
INQWIRE 1.5J .035X260CM (WIRE) ×2
KIT HEART LEFT (KITS) ×1 IMPLANT
SHEATH PROBE COVER 6X72 (BAG) ×1 IMPLANT
TRANSDUCER W/STOPCOCK (MISCELLANEOUS) ×1 IMPLANT
TUBING CIL FLEX 10 FLL-RA (TUBING) ×1 IMPLANT

## 2021-03-11 NOTE — Interval H&P Note (Signed)
History and Physical Interval Note:  03/11/2021 7:35 AM  Tony Combs  has presented today for surgery, with the diagnosis of abnormal coronary CTA, cardiomyopathy.  The various methods of treatment have been discussed with the patient and family. After consideration of risks, benefits and other options for treatment, the patient has consented to  Procedure(s): LEFT HEART CATH AND CORONARY ANGIOGRAPHY (N/A)  PERCUTANEOUS CORONARY INTERVENTION   as a surgical intervention.  The patient's history has been reviewed, patient examined, no change in status, stable for surgery.  I have reviewed the patient's chart and labs.  Questions were answered to the patient's satisfaction.    Cath Lab Visit (complete for each Cath Lab visit)  Clinical Evaluation Leading to the Procedure:   ACS: No.  Non-ACS:    Anginal Classification: CCS I  Anti-ischemic medical therapy: Minimal Therapy (1 class of medications)  Non-Invasive Test Results: High-risk stress test findings: cardiac mortality >3%/year  Prior CABG: No previous CABG    [name]

## 2021-03-11 NOTE — TOC Benefit Eligibility Note (Signed)
Patient Product/process development scientist completed.    The patient is currently admitted and upon discharge could be taking ENTRESTO 24-26MG .  The current 30 day co-pay is, $642.85 this is due to a deductible of $3857.15.   The patient is currently admitted and upon discharge could be taking FARXIGA 10MG .  The current 30 day co-pay is, $75. If eVoucher($60) is applied the copay is $15.   The patient is currently admitted and upon discharge could be taking JARDIANCE 10MG .  The current 30 day co-pay is, $75.  If eVoucher($40.01) is applied the copay is $34.99.   , CPhT Pharmacy Patient Advocate Specialist [facility] Patient Advocate Team Direct Number: [phone] Fax: [phone]

## 2021-04-04 NOTE — Telephone Encounter (Signed)
I will provide this to [facility] so they can provide the update.

## 2021-04-04 NOTE — Telephone Encounter (Signed)
[facility] Vyvgart [facility] [place]) Check status on Vyvart case. Would like a call from the nurse

## 2021-04-05 ENCOUNTER — Telehealth: Payer: Self-pay | Admitting: Cardiology

## 2021-04-05 NOTE — Telephone Encounter (Signed)
Would stop spironolactone. For 1 week cut Entresto dose in half.  If potassium stabilizes, then can can return to regular dose of Entresto.  Bryan Lemma, MD

## 2021-04-05 NOTE — Telephone Encounter (Signed)
Spoke to Tony Combs at Dr.Burdine's office.She stated patient's potassium was 5.8.A repeat potassium was repeated this morning.Dr.Burdine wanted to ask Dr.Harding if he wanted to adjust the Entresto dose.Advised I will send message to Dr.Harding.

## 2021-04-05 NOTE — Telephone Encounter (Signed)
Ferol Luz with Dr.Burdine left message for her to call back.  Spoke to patient's wife Dr.Harding's advice given.Patient will stop spironolactone.Decrease Entresto dose in half for 1 week.She will check with Dr.Burdine about potassium.If potassium normal he will return to normal dose of Entresto.

## 2021-04-05 NOTE — Telephone Encounter (Signed)
Pt c/o medication issue:  1. Name of Medication:  sacubitril-valsartan (ENTRESTO) 49-51 MG  2. How are you currently taking this medication (dosage and times per day)?    3. Are you having a reaction (difficulty breathing--STAT)?   4. What is your medication issue?   Per Albin Felling, Dr. Leandrew Koyanagi has reviewed 2/06 labs. Potassium was at 5.8 and patient is having repeat labs with them today. Dr. Leandrew Koyanagi would like to know if Dr. Herbie Baltimore would like to adjust patient's Entresto. Please advise.

## 2021-04-12 ENCOUNTER — Telehealth: Payer: Self-pay | Admitting: Cardiology

## 2021-04-12 NOTE — Telephone Encounter (Signed)
See previous message from Dr. Herbie Baltimore: [name], MD  Would stop spironolactone. For 1 week cut Entresto dose in half.  If potassium stabilizes, then can can return to regular dose of Entresto.   Bryan Lemma, MD

## 2021-04-12 NOTE — Telephone Encounter (Signed)
Paula Compton from patient's PCP is calling stating the patient's potasium level came back as 5.6 on 02/18. Dr. Leandrew Koyanagi is also wanting Dr. Elissa Hefty input on any changes in [place] dosage. Please advise.

## 2021-04-12 NOTE — Telephone Encounter (Signed)
2 days was probably not enough time to see appreciable drop in K+  Would stay on 26-24 mg Entresto along with the increased dose of Carvedilol. Start Furosemide 20 mg daily.   [name], MD

## 2021-04-16 NOTE — Telephone Encounter (Signed)
Called patient and wife.   RN informed both will need to call back tomorrow  with clarification  . Need to discuss with Dr Herbie Baltimore.

## 2021-04-17 NOTE — Telephone Encounter (Signed)
Karla from [facility] calling back. Phone: [phone]  ?

## 2021-04-24 NOTE — Telephone Encounter (Signed)
Returned call to Dr. Cato Mulligan office ? ?See duplicate phone note-discussing with MD.  ? ?Left message to call back ?Routed to primary nurse ?

## 2021-04-24 NOTE — Telephone Encounter (Signed)
Office is calling back because they havent received an answer to Dr. Leandrew Koyanagi question. Please advise  ?

## 2021-04-25 NOTE — Telephone Encounter (Signed)
Spoke to [place] with Dr.Burdine's office.She stated patient had a repeat potassium yesterday which was 4.4.She will fax results to Dr.Harding.Advised patient was told 2 weeks ago to stop Spironolactone.Decrease Entresto to half dose for 1 week,if potassium returns to normal he is to return to normal dose of Entresto. ?Called patient left message to call back. ?

## 2021-04-25 NOTE — Telephone Encounter (Signed)
Office was returning call. Please advise ?

## 2021-05-01 NOTE — Telephone Encounter (Signed)
See 04/12/21 telephone note ?

## 2021-05-02 ENCOUNTER — Telehealth: Payer: Self-pay | Admitting: *Deleted

## 2021-05-02 NOTE — Telephone Encounter (Signed)
Pt here for Vyvgart infusion. BP 172/97 prior to infusion, per Maudie Mercury RN in infusion suite. Dr Jaynee Eagles, work-in MD, spoke with the patient, advised of possible s/e of hydralazine and also discussed that patient needs to be working with his PCP regarding BP control. Patient aware fluids given during infusions could potentially increase BP. Discussed uncontrolled HTN has risks such as stroke, etc. Pt aware and agrees to have Hydralazine to treat elevated BP prior to infusion today. Infusion suite took verbal order.  ?

## 2021-05-07 ENCOUNTER — Telehealth: Payer: Self-pay | Admitting: Cardiology

## 2021-05-07 NOTE — Telephone Encounter (Addendum)
Wife informed that sample bottle of entresto 49-51 and patient assistance application will be at the front office. She said she will pick them up on Thursday.  ?

## 2021-05-07 NOTE — Telephone Encounter (Signed)
Pt c/o BP issue: STAT if pt c/o blurred vision, one-sided weakness or slurred speech ? ?1. What are your last 5 BP readings?  ? ?2. Are you having any other symptoms (ex. Dizziness, headache, blurred vision, passed out)?  ? ?3. What is your BP issue?  ? ?Patient following up to provide BP reading. He states he just took his BP and it is 152/72. No symptoms to report.  ?

## 2021-05-07 NOTE — Telephone Encounter (Addendum)
Patient sent in BP of 152/72. Spoke with wife who stated they marked patient's prescription bottle to remind patient to take 2 tablets of the carvedilol twice daily. Patient will continue to monitor BP and send [place] readings on Thursday. Wife asked for assistance with entresto. Routed to PharmD. Will try to get samples, too. ?

## 2021-05-07 NOTE — Telephone Encounter (Signed)
Pls make sure he is taking coreg 12.5 mg BID ? ?DH ? ?

## 2021-05-07 NOTE — Telephone Encounter (Signed)
?  Pt c/o BP issue: STAT if pt c/o blurred vision, one-sided weakness or slurred speech ? ?1. What are your last 5 BP readings? 174/94 ? ?2. Are you having any other symptoms (ex. Dizziness, headache, blurred vision, passed out)? none ? ?3. What is your BP issue? Pt said his bp is running low and wanted to speak with a nurse to ask if he needs to adjust his medication  ?

## 2021-05-07 NOTE — Telephone Encounter (Signed)
Patient stated his BP has been running form 160/80 to 197/95. This am, after taking medications, 174/94. He denies chest pain, headache, dizziness. When going over medications, he stated he is only taking carvedilol 6.25 mg twice daily, not the 12.5 mg twice daily. On his own, he took another 6.25 mg. I asked him to call me in an hour with another BP. He voiced understanding to take two 6.25 mg tablets twice a day. ?

## 2021-05-20 ENCOUNTER — Ambulatory Visit (INDEPENDENT_AMBULATORY_CARE_PROVIDER_SITE_OTHER): Payer: 59 | Admitting: Cardiology

## 2021-05-20 ENCOUNTER — Telehealth: Payer: Self-pay | Admitting: Cardiology

## 2021-05-20 ENCOUNTER — Encounter: Payer: Self-pay | Admitting: Cardiology

## 2021-05-20 VITALS — BP 128/78 | HR 78 | Ht 68.0 in | Wt 227.4 lb

## 2021-05-20 DIAGNOSIS — E875 Hyperkalemia: Secondary | ICD-10-CM

## 2021-05-20 DIAGNOSIS — I420 Dilated cardiomyopathy: Secondary | ICD-10-CM

## 2021-05-20 DIAGNOSIS — E1169 Type 2 diabetes mellitus with other specified complication: Secondary | ICD-10-CM | POA: Diagnosis not present

## 2021-05-20 DIAGNOSIS — I10 Essential (primary) hypertension: Secondary | ICD-10-CM

## 2021-05-20 DIAGNOSIS — I5042 Chronic combined systolic (congestive) and diastolic (congestive) heart failure: Secondary | ICD-10-CM

## 2021-05-20 DIAGNOSIS — I700 Atherosclerosis of aorta: Secondary | ICD-10-CM | POA: Diagnosis not present

## 2021-05-20 DIAGNOSIS — E785 Hyperlipidemia, unspecified: Secondary | ICD-10-CM

## 2021-05-20 MED ORDER — SPIRONOLACTONE 25 MG PO TABS
12.5000 mg | ORAL_TABLET | Freq: Every day | ORAL | 3 refills | Status: DC
Start: 2021-05-20 — End: 2022-05-22

## 2021-05-20 MED ORDER — FUROSEMIDE 20 MG PO TABS: ORAL_TABLET | ORAL | 6 refills | Status: DC

## 2021-05-20 MED ORDER — ENTRESTO 49-51 MG PO TABS: 1.0000 | ORAL_TABLET | Freq: Two times a day (BID) | ORAL | 3 refills | Status: DC

## 2021-05-20 NOTE — Telephone Encounter (Signed)
Patient states he was given a fax number from [place], but it does not seem to be working. Fax: [phone] ?

## 2021-05-20 NOTE — Telephone Encounter (Signed)
Spoke with pt. Gave him our other fax number to try. Pt will send with attention to Dr. Herbie Baltimore or Jasmine December. Pt verbalizes understanding. ?

## 2021-05-20 NOTE — Patient Instructions (Addendum)
Medication Instructions:  ?Start taking Spironolactone 12.5 mg ( 1/2 tablet of 25 mg ) daily  ? ?   May take furosemide  (Lasix )20 mg by mouth   as needed daily  for increase swelling or shortness of breath- or weight gain  of 3 lb overnight  and/or 5 lb over a week.  ? ?*If you need a refill on your cardiac medications before your next appointment, please call your pharmacy* ? ? ?Lab Work: ?Not needed ? ?Testing/Procedures: ? ?Not needed ? ?Follow-Up: ?At [facility], you and your health needs are our priority.  As part of our continuing mission to provide you with exceptional heart care, we have created designated Provider Care Teams.  These Care Teams include your primary Cardiologist (physician) and Advanced Practice Providers (APPs -  Physician Assistants and Nurse Practitioners) who all work together to provide you with the care you need, when you need it. ? ?  ? ?Your next appointment:   ?6 month(s) ? ?The format for your next appointment:   ?In Person ? ?Provider:   ?[name], MD  ? ? ?Other Instructions  ?

## 2021-05-20 NOTE — Progress Notes (Signed)
? ?Primary Care Provider: Curlene Labrum, MD ?Cardiologist: [name], MD ?Electrophysiologist: None ? ?Clinic Note: ?Chief Complaint  ?Patient presents with  ? Hospitalization Follow-up  ?  Post cath follow-up  ? Cardiomyopathy  ?  No significant heart failure symptoms.  ? ?=================================== ? ?ASSESSMENT/PLAN  ? ?Problem List Items Addressed This Visit   ? ?  ? Cardiology Problems  ? Cardiomyopathy, dilated, nonischemic (HCC) - Primary (Chronic)  ?  Clearly nonischemic cardiomyopathy baseline prior to the very small caliber RCA would not be the cause for his EF to be dramatically reduced.  Thankfully, he is only having NYHA Class IIa symptoms with only trivial edema. ? ?Unfortunately, he is not yet to improved with optimize medical management.  However symptomatically he has definitely improved. ? ?Plan: ?Continue with increased dose of carvedilol 12.5 mg twice daily (can change prescription accordingly)  ?Refill prescription for Entresto 49 to 51 mg twice daily and assist with patient assistance paperwork. ?Restart other spironolactone 12.5 mg daily ?When financial issues have been resolved, and confirmation of no interaction between SGLT2 inhibitors and MG medications, would suggest initiating either [place] or Jardiance. ?Start PRN Lasix 20 mg = discussed sliding scale ?  ?  ? Relevant Medications  ? spironolactone (ALDACTONE) 25 MG tablet  ? furosemide (LASIX) 20 MG tablet  ? sacubitril-valsartan (ENTRESTO) 49-51 MG  ? Thoracic aortic atherosclerosis (HCC) (Chronic)  ?  Noted on CT scan: Continue aggressive risk modification blood pressure, lipid and glycemic control. ?  ?  ? Relevant Medications  ? spironolactone (ALDACTONE) 25 MG tablet  ? furosemide (LASIX) 20 MG tablet  ? sacubitril-valsartan (ENTRESTO) 49-51 MG  ? Chronic combined systolic and diastolic heart failure (HCC) (Chronic)  ?  Stable NYHA Class IIa symptoms.  He is having some mild edema so we will restart the  spironolactone 12.5 mg daily as well as PRN Lasix.  Need to monitor potassium levels. ? ?Continue 12.5 mg twice daily and carvedilol, increase back to 49 to 51 mg Entresto twice daily. ? ?He only lacks having SGLT2 inhibitor initiated, but due to financial concerns and potential interactions with MG medications, we have held off. ?  ?  ? Relevant Medications  ? spironolactone (ALDACTONE) 25 MG tablet  ? furosemide (LASIX) 20 MG tablet  ? sacubitril-valsartan (ENTRESTO) 49-51 MG  ? Essential hypertension (Chronic)  ?  BP looks better today.  I think we can probably push back up to the full 49-51 mg dose of Entresto.  Can also restart low-dose spironolactone at 12.5 mg daily. ?  ?  ? Relevant Medications  ? spironolactone (ALDACTONE) 25 MG tablet  ? furosemide (LASIX) 20 MG tablet  ? sacubitril-valsartan (ENTRESTO) 49-51 MG  ? Hyperlipidemia associated with type 2 diabetes mellitus (HCC) (Chronic)  ?  On high-dose atorvastatin.  Tolerating well.  Last lipids look great. ?Target LDL is less than 100 his LDLs now less than 60. ? ?He is on glargine insulin ?We have not used SGLT2 inhibitor, because of the financial concerns with him being off MG medication as well as the issues with Entresto. ? ?Would eventually like to try again along either [place] or Jardiance. ?  ?  ? Relevant Medications  ? spironolactone (ALDACTONE) 25 MG tablet  ? furosemide (LASIX) 20 MG tablet  ? sacubitril-valsartan (ENTRESTO) 49-51 MG  ?  ? Other  ? Hyperkalemia (Chronic)  ?  Continue to monitor.  Okay by last check. ?  ?  ? ? ?=================================== ? ?HPI:   ? ?  Tony Combs is a 61 y.o. male with a PMH Notable for recent Dx of  Myasthenia Gravis along with Nonischemic Cardiomyopathy diagnosed at the same time as CVA (July 2022) who presents today for delayed post-cath follow-up. ? 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 =>  ?Echo showed EF 30 to 35%.   Plan was outpatient evaluation.   ?Was started on carvedilol and Entresto prior to discharge along with high-dose atorvastatin, aspirin and Plavix. ?Dilated CM => initial post hospital follow-up on 11/06/2020 with Almyra Deforest, PA -> . ?Entresto titrated up to 49 to 51 mg daily.   ?Spironolactone delayed because of concerns of hyperkalemia.  ? 3 month f/u Echo 12/31/20:  EF 30 to 35%.  Mild LVH.  GR 1 DD.  Cannot exclude PFO. => Echo with bubble 02/07/2021: EF 30 to 35%.  No PFO. ?OSA:  had been randomized to CPAP treatment arm of the "Sleep Smart Study " ?Recent Myasthenia Gravis (Nov 2022)- ?he was started on Mestinon, but had nausea, dose was reduced.   ?Plan is to consider Vyvgart injections pending insurance review. ?He also has hypertension Hyperlipidemia and Diabetes Mellitus, Type II ? ?KIT DELUCA was last seen on August 29, 2021 to follow-up his Coronary CTA results revealing a Coronary Calcium Score 40 with moderate plaque in the RCA (FFR positive) => proceeded with LHC-[facility]. ?-> He was not really walking that much.  Limited mobility, walking with a cane.  Some dyspnea walking up stairs and random left-sided chest pain.  Had started riding a stationary bicycle at least 10 minutes at a reasonable pace.  No chest pain or pressure.  No dyspnea.  Was scared to questions up further.  No PND orthopnea.  Trivial edema.  Did note generalized fatigue and weakness ?Increase carvedilol to 12.5 mg twice daily; Spironolactone 25 mg  -> plan was to reduce Entresto dose to 1/2 tab BID until financial paperwork completed.  We will ? ?Recent Hospitalizations:  ?03/11/2021 cath ? ?Repeat K+ was 4.4 - Entresto restared @ original dose & Spironolactone d/c'd. Started Lasix 20 mg daily (not listed) ? ?Reviewed  CV studies:   ? ?The following studies were reviewed today: (if available, images/films reviewed: From Epic Chart or Care Everywhere) ?L Heart Cardiac Cath / Cor Angio 03/11/2021:   Prox RCA lesion is 90% stenosed.-Small to  moderate caliber nondominant vessel (<2 mm) => not PCI amenable.  Not the cause of cardiomyopathy.   Otherwise minimal diffuse CAD and a Left Dominant System.   LV EDP is normal with Systemic Hypertension (Central Aop ~20 mmHg > BP Cuff) ?  ? ?Interval History:  ? ?RASHOD KLASE returns here today for post-cath follow-up doing fairly well.  He still denies any PND orthopnea and just has mild trivial end of day swelling.  But not that bad.  He is actually not taking the furosemide anymore. ?He always notes having some exertional dyspnea and fatigue, but overall is feeling better. He is trying to do more exercise riding a stationary cycle. ? ?CV Review of Symptoms (Summary) ?Cardiovascular ROS: positive for - dyspnea on exertion, edema, and Exercise intolerance and fatigue which is generally getting better. ?negative for - chest pain, irregular heartbeat, orthopnea, palpitations, paroxysmal nocturnal dyspnea, rapid heart rate, shortness of breath, or Lightheadedness, dizziness or wooziness, syncope/near syncope or TIA/amaurosis fugax claudication ? ?REVIEWED OF SYSTEMS  ? ?Review of Systems  ?Constitutional:  Positive for malaise/fatigue (Generalized lack of  energy.  Pretty much stable.). Negative for weight loss.  ?HENT:  Negative for congestion and nosebleeds.   ?Respiratory:  Negative for cough and shortness of breath.   ?Cardiovascular:   ?     Per HPI  ?Gastrointestinal:  Negative for blood in stool, constipation, diarrhea (.) and melena.  ?Genitourinary:  Negative for hematuria.  ?Musculoskeletal:  Positive for joint pain and myalgias. Negative for falls.  ?Neurological:  Positive for dizziness and weakness (Mild muscle weakness). Negative for focal weakness and headaches.  ?Psychiatric/Behavioral:  Negative for depression (Seems to be in okay spirits.).   ? ?No longer having the cramping having switched to lower dose of Mestinon. ? ?I have reviewed and (if needed) personally updated the patient's problem list,  medications, allergies, past medical and surgical history, social and family history.  ? ?PAST MEDICAL HISTORY  ? ?Past Medical History:  ?Diagnosis Date  ? CAD in native artery 03/11/2021  ? Small Non-dom[place]

## 2021-05-29 ENCOUNTER — Telehealth: Payer: Self-pay | Admitting: *Deleted

## 2021-05-29 ENCOUNTER — Encounter: Payer: Self-pay | Admitting: Cardiology

## 2021-05-29 NOTE — Assessment & Plan Note (Signed)
Noted on CT scan: Continue aggressive risk modification blood pressure, lipid and glycemic control. ?

## 2021-05-29 NOTE — Assessment & Plan Note (Signed)
On high-dose atorvastatin.  Tolerating well.  Last lipids look great. ?Target LDL is less than 100 his LDLs now less than 60. ? ?He is on glargine insulin ?We have not used SGLT2 inhibitor, because of the financial concerns with him being off MG medication as well as the issues with Entresto. ? ?Would eventually like to try again along either [place] or Jardiance. ?

## 2021-05-29 NOTE — Assessment & Plan Note (Signed)
BP looks better today.  I think we can probably push back up to the full 49-51 mg dose of Entresto.  Can also restart low-dose spironolactone at 12.5 mg daily. ?

## 2021-05-29 NOTE — Assessment & Plan Note (Signed)
Stable NYHA Class IIa symptoms.  He is having some mild edema so we will restart the spironolactone 12.5 mg daily as well as PRN Lasix.  Need to monitor potassium levels. ? ?Continue 12.5 mg twice daily and carvedilol, increase back to 49 to 51 mg Entresto twice daily. ? ?He only lacks having SGLT2 inhibitor initiated, but due to financial concerns and potential interactions with MG medications, we have held off. ?

## 2021-05-29 NOTE — Assessment & Plan Note (Addendum)
Clearly nonischemic cardiomyopathy baseline prior to the very small caliber RCA would not be the cause for his EF to be dramatically reduced.  Thankfully, he is only having NYHA Class IIa symptoms with only trivial edema. ? ?Unfortunately, he is not yet to improved with optimize medical management.  However symptomatically he has definitely improved. ? ?Plan: ?? Continue with increased dose of carvedilol 12.5 mg twice daily (can change prescription accordingly)  ?? Refill prescription for Entresto 49 to 51 mg twice daily and assist with patient assistance paperwork. ?? Restart other spironolactone 12.5 mg daily ?? When financial issues have been resolved, and confirmation of no interaction between SGLT2 inhibitors and MG medications, would suggest initiating either [place] or Jardiance. ?? Start PRN Lasix 20 mg = discussed sliding scale ?

## 2021-05-29 NOTE — Assessment & Plan Note (Addendum)
Continue to monitor.  Okay by last check. ?

## 2021-05-29 NOTE — Telephone Encounter (Signed)
RN has not received the information need to complete patient assistance form  ? Patient stated the information will be faxed today -- need out of pocket expense and tax documents. ?For  Entresto  patient assistance  ?

## 2021-06-19 ENCOUNTER — Ambulatory Visit: Payer: BC Managed Care – PPO | Admitting: Nurse Practitioner

## 2021-07-04 ENCOUNTER — Ambulatory Visit (INDEPENDENT_AMBULATORY_CARE_PROVIDER_SITE_OTHER): Payer: 59 | Admitting: Neurology

## 2021-07-04 ENCOUNTER — Encounter: Payer: Self-pay | Admitting: Neurology

## 2021-07-04 VITALS — BP 139/73 | HR 68 | Ht 68.0 in | Wt 229.4 lb

## 2021-07-04 DIAGNOSIS — Z8673 Personal history of transient ischemic attack (TIA), and cerebral infarction without residual deficits: Secondary | ICD-10-CM | POA: Diagnosis not present

## 2021-07-04 DIAGNOSIS — G4733 Obstructive sleep apnea (adult) (pediatric): Secondary | ICD-10-CM

## 2021-07-04 DIAGNOSIS — G7000 Myasthenia gravis without (acute) exacerbation: Secondary | ICD-10-CM

## 2021-07-04 NOTE — Progress Notes (Signed)
[facility] [address] [place]. [place] [address] [phone]       OFFICE FOLLOW-UP NOTE  Tony Combs Date of Birth:  27-Sep-1960 Medical Record Number:  [id number]   HPI: Last visit 12/06/2020 Tony Combs is a 61 year old Caucasian male seen today for initial office follow-up visit following hospital consultation for stroke in July 2022.  History is obtained from the patient and his wife and review of electronic medical records and I personally reviewed pertinent imaging films in PACS.  He has past medical history of hypertension, hyperlipidemia, diabetes, coronary artery disease who presented to [facility] ED on 08/31/2020 for sudden onset of slurred speech with visual blurring and double vision.  Symptoms began the day prior to admission she went to [facility] where his noncontrast CT scan of the head was done which was unremarkable but before seeing the doctor she left [place].  She presented to [facility], ED the next day with complaints of persistent slurred speech with headache and blurred and double vision.  He also noted right side of the face felt weak and tingling.  He also felt off balance and dizzy at work but denies any focal extremity weakness.  MRI scan of the brain showed a 12 mm acute infarct in the anterior medial right frontal subcortical white matter.  Remote age tiny chronic left thalamic infarct was also noted.  MR angiogram study of the brain showed multifocal stenoses.with mild-to-moderate stenosis within the distal A2 segment of the right anterior cerebral artery. Severe stenosis within the left posterior cerebral artery at the P1/P2 junction. and MRA of the neck was unremarkable.  2D echo showed diminished ejection fraction of 30 to 35% but no clot.  LDL cholesterol 61 mg percent and hemoglobin A1c was elevated at 11.7.  Patient was felt to be at high risk for sleep apnea and offered participation in the sleep smart study for  stroke prevention and agreed to participate.  He was randomized to this CPAP treatment for his sleep apnea.  On exam patient was felt to have dysarthric and hypophonic speech with subtle ptosis of his left eye with left pupil being smaller in size in the right and because of his subjective complaints of diplopia, dysarthria and gait ataxia felt to have a small brainstem infarct not visualized on the MRI in addition to which was seen in the right subcortical region which could not explain his symptoms.  He was discharged on aspirin and Plavix for 3 months followed by aspirin alone.  Patient states that is obtained some improvement but his wife still becomes soft and slurred when he is tired.  Is also has some intermittent swallowing difficulties.  He also complains of persistent blurred and double vision particularly towards the end of the day.  His mornings are much better.  His balance is still not right so uses a cane to walk.  He has had no falls or injuries.  His wife has noted that at the end of the day his eyes are not synchronous and the left eye is does not move as much as the right.  Patient is tolerating aspirin well without bruising or bleeding.  His blood pressure is well controlled today it is 129/75.  His sugars are doing better and hemoglobin A1c is now down to 7.  His average blood glucose for the last 10 days is 142.  He remains on Lipitor which is tolerating well without side effects.  He has  been compliant with using his CPAP mask which he got through the sleep smart study and feels it is helping him.  Update 02/26/2021 : He returns for follow-up after last visit 2-1/2 months ago.  Is recommended by his wife.  Patient continues to have speech and swallowing difficulties which is more or less unchanged.  In his diet his speech often becomes slurred.  He denies any double vision but wife does notice some drooping eyelids to the end of the day.  He also has trouble swallowing at times.  Patient did  not tolerate Mestinon due to cramps and diarrhea and he takes only 30 mg once a day he is not sure if this is helping him.  He denies any trouble getting out of chair or walking or weakness of proximal shoulder or thigh muscles.  Lab work on 12/06/2020 shows strongly positive acetylcholine receptor binding antibody titer of 3.62 as well as acetylcholine blocking antibody in a titer of 95.  He previously had CT scan of the chest done on 08/30/2020 which did not show any thymoma but showed some mediastinal lymphadenopathy.  Hemoglobin A1c done on 02/19/2021 was elevated at 7.1.  EMG nerve conduction study with rapid repetitive stimulation on 01/24/2021 was positive for decrement confirming diagnosis of myasthenia.  It also showed evidence of moderate right carpal tunnel and right ulnar nerve entrapment at the elbow.  Patient does complain of hand paresthesias and some grip weakness.  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 Update 07/04/2021 ; He returns for follow-up after last visit 4 months ago.  He is accompanied by his exposures.  Patient states he is finished for injections of Vyvgart at 1 weekly intervals on 05/15/2021 and has noticed improvement in his speech and swallowing.  He does occasionally get intermittent diplopia which is transient.  He denies any drooping of eyelids or significant dysphagia or muscle weakness or fatigability.H his speech has remained quite good over the last 1 month despite not having injections.  He had trouble tolerating Mestinon due to diarrhea in the past and is not a good candidate for long-term steroids given his obesity and comorbidities.  He has had no recurrent stroke or TIA symptoms.  He remains on aspirin which is tolerating well without bruising or bleeding and Lipitor without muscle aches and pains.  He states has not been quite regular using his CPAP for his sleep apnea.  His blood pressures under good control today it is 139/73.  His  diabetes is yet suboptimally controlled with last A1c being 8.2.  He has no new complaints. ROS:   14 system review of systems is positive for blurred vision, double vision, soft voice, dysphagia, tiredness, fatigue, imbalance of gait, hand tingling, numbness and weakness and all other systems negative  PMH:  Past Medical History:  Diagnosis Date   CAD in native artery 03/11/2021   Small Non-dominant RCA - prox 90% (CTA FFR 0.66) - Not favorable for PCI - small caliber, non-dominant (<2 mm) vessel.  Mininal diffuse disease in LCx.   Diabetes mellitus without complication ([place])    Dilated cardiomyopathy ([place]) 01/17/2021   In setting of stroke: TTE 08/31/2020: EF 30 and 35%.  Mildly decreased function.  Moderate LVH.  Unable to assess diastolic function.  Unable assess RV function.  Mild LA dilation.   Hyperlipidemia    Hypertension    Myasthenia gravis ([place]) 03/01/2021   Followed by Dr. Robyne Askew half dose Mestinon  Stroke ([place]) 08/30/2020   Right frontal lobe stroke with dysarthria and blurred vision.    Social History:  Social History   Socioeconomic History   Marital status: Married    Spouse name: Not on file   Number of children: Not on file   Years of education: Not on file   Highest education level: Not on file  Occupational History   Not on file  Tobacco Use   Smoking status: Never   Smokeless tobacco: Never  Vaping Use   Vaping Use: Never used  Substance and Sexual Activity   Alcohol use: Not Currently   Drug use: Not Currently   Sexual activity: Not Currently  Other Topics Concern   Not on file  Social History Narrative   Not on file   Social Determinants of Health   Financial Resource Strain: Not on file  Food Insecurity: Not on file  Transportation Needs: Not on file  Physical Activity: Not on file  Stress: Not on file  Social Connections: Not on file  Intimate Partner Violence: Not on file    Medications:   Current Outpatient Medications on File  Prior to Visit  Medication Sig Dispense Refill   aspirin EC 81 MG tablet Take 81 mg by mouth in the morning. Swallow whole.     atorvastatin (LIPITOR) 80 MG tablet Take 1 tablet (80 mg total) by mouth daily. 30 tablet 0   B-D UF III MINI PEN NEEDLES 31G X 5 MM MISC SMARTSIG:1 Pre-Filled Pen Syringe SUB-Q 4 Times Daily     blood glucose meter kit and supplies Dispense based on patient and insurance preference. Use up to four times daily as directed. (FOR ICD-10 E10.9, E11.9). 1 each 0   carvedilol (COREG) 6.25 MG tablet Take 2 tablets (12.5 mg total) by mouth 2 (two) times daily with a meal. 120 tablet 0   Continuous Blood Gluc Sensor (FREESTYLE LIBRE 3 SENSOR) MISC Inject 1 applicator into the skin every 14 (fourteen) days. 2 each 2   efgartigimod alfa-fcab (VYVGART) 400 MG/20ML injection Inject 1,000 mg into the vein every 30 (thirty) days. (Patient not taking: Reported on 05/20/2021) 50 mL 3   furosemide (LASIX) 20 MG tablet May take furosemide (Lasix )20 mg by mouth as needed daily  for increase swelling or shortness of breath and/or weight gain of 3 lb overnight and/or 5 lb over a week. 30 tablet 6   insulin glargine, 1 Unit Dial, (TOUJEO) 300 UNIT/ML Solostar Pen Inject 20 Units into the skin at bedtime. 1.5 mL 0   Insulin Pen Needle (PEN NEEDLES) 30G X 5 MM MISC 1 pen by Does not apply route 4 (four) times daily - after meals and at bedtime. 200 each 1   NOVOLOG FLEXPEN 100 UNIT/ML FlexPen Inject 4-7 Units into the skin 3 (three) times daily with meals. (Patient taking differently: Inject 5-10 Units into the skin 3 (three) times daily with meals.) 15 mL 3   pyridostigmine (MESTINON) 60 MG tablet Take 0.5 tablets (30 mg total) by mouth 3 (three) times daily. Take 0.5 tablets 3 times daily for 1 week and then 1 tablet 3 times daily if tolerated (Patient not taking: Reported on 05/20/2021) 90 tablet 1   sacubitril-valsartan (ENTRESTO) 49-51 MG Take 1 tablet by mouth 2 (two) times daily. (Patient not  taking: Reported on 07/04/2021) 180 tablet 3   spironolactone (ALDACTONE) 25 MG tablet Take 0.5 tablets (12.5 mg total) by mouth daily. (Patient not taking: Reported on 07/04/2021) 45 tablet  3   Current Facility-Administered Medications on File Prior to Visit  Medication Dose Route Frequency Provider Last Rate Last Admin   sodium chloride flush (NS) 0.9 % injection 10 mL  10 mL Intravenous PRN Leonie Man, MD   20 mL at 02/07/21 6767    Allergies:  No Known Allergies  Physical Exam General: Obese middle-aged Caucasian male, seated, in no evident distress Head: head normocephalic and atraumatic.  Neck: supple with no carotid or supraclavicular bruits Cardiovascular: regular rate and rhythm, no murmurs Musculoskeletal: no deformity Skin:  no rash/petichiae Vascular:  Normal pulses all extremities Vitals:   07/04/21 1417  BP: 139/73  Pulse: 68   Neurologic Exam Mental Status: Awake and fully alert. Oriented to place and time. Recent and remote memory intact. Attention span, concentration and fund of knowledge appropriate. Mood and affect appropriate.  Voice seems normal today.  No dysarthria. Cranial Nerves: Fundoscopic exam not done. Pupils equal, briskly reactive to light. Extraocular movements full without nystagmus. Visual fields full to confrontation. Hearing intact. Facial sensation intact. Face, tongue, palate moves normally and symmetrically.  Mild drooping of both eyelids on sustained upgaze but without subjective diplopia and no ophthalmoplegia. Motor: Normal bulk and tone. Normal strength in all tested extremity muscles. Sensory.: intact to touch ,pinprick .position and vibratory sensation.  Coordination: Rapid alternating movements normal in all extremities. Finger-to-nose and heel-to-shin performed accurately bilaterally. Gait and Station: Arises from chair without difficulty. Stance is normal. Gait demonstrates normal stride length and balance .  Unable to heel, toe and  tandem walk without difficulty.  Reflexes: 1+ and symmetric. Toes downgoing.      ASSESSMENT: 61 year old Caucasian male with sudden onset of slurred speech, diplopia and dysphagia in July 2022 likely from small brainstem infarct not visualized on MRI from small vessel disease.  MRI at that time had shown also right periventricular white matter infarct which is probably silent.  Multiple vascular risk factors of hypertension, diabetes, hyperlipidemia obesity and sleep apnea.  Patient has been randomized to the CPAP treatment arm of the sleep smart study.   new symptoms of increased fatigability tiredness and worsening speech and swallowing from antibody positive  myasthenia which seem to have responded to Vyvgart infusions.Marland Kitchen     PLAN:I had a long discussion with the patient regarding his myasthenia symptoms which seem to have improved with the 4 injections of Vyvgart that he seems to have tolerated quite well.  He has not been able to tolerate Mestinon and he is not a good candidate for long-term steroids given his obesity and medical comorbidities.  I will discuss with my neuromuscular colleague and decide on starting him on either CellCept or maintenance Vyvgartt if possible.  He is doing well from stroke standpoint without recurrent symptoms.  Continue aspirin for stroke prevention and maintain aggressive risk factor modification with strict control of hypertension with blood pressure goal below 130/90, lipids with LDL cholesterol goal below 70 mg percent and diabetes with hemoglobin A1c goal below 6.5%.  He is also encouraged to eat a healthy diet with lots of fruits, vegetables, cereals, whole grains and to be active and exercise and lose weight.  He is also enrolled in the sleep smart study and was advised to be compliant with using his CPAP every night.  Return for follow-up in the future in 3 months or call earlier if necessary. .Greater than 50% of time during this 35 minute visit was spent on  counseling,explanation of diagnosis, planning of further management, discussion  with patient and family and coordination of care Antony Contras, MD Note: This document was prepared with digital dictation and possible smart phrase technology. Any transcriptional errors that result from this process are unintentional

## 2021-07-04 NOTE — Patient Instructions (Signed)
I had a long discussion with the patient regarding his myasthenia symptoms which seem to have improved with the 4 injections of Vyvgart that he seems to have tolerated quite well.  He has not been able to tolerate Mestinon and he is not a good candidate for long-term steroids given his obesity and medical comorbidities.  I will discuss with my neuromuscular colleague and decide on starting him on either CellCept or maintenance Vyvgartt if possible.  He is doing well from stroke standpoint without recurrent symptoms.  Continue aspirin for stroke prevention and maintain aggressive risk factor modification with strict control of hypertension with blood pressure goal below 130/90, lipids with LDL cholesterol goal below 70 mg percent and diabetes with hemoglobin A1c goal below 6.5%.  He is also encouraged to eat a healthy diet with lots of fruits, vegetables, cereals, whole grains and to be active and exercise and lose weight.  He is also enrolled in the sleep smart study and was advised to be compliant with using his CPAP every night.  Return for follow-up in the future in 3 months or call earlier if necessary.

## 2021-08-05 ENCOUNTER — Telehealth: Payer: Self-pay | Admitting: Neurology

## 2021-08-05 DIAGNOSIS — Z0271 Encounter for disability determination: Secondary | ICD-10-CM

## 2021-08-05 NOTE — Telephone Encounter (Signed)
LVM for reschedule needed for 8/23 appointment - MD out

## 2021-08-15 ENCOUNTER — Telehealth: Payer: Self-pay | Admitting: Neurology

## 2021-08-15 NOTE — Telephone Encounter (Signed)
Pt's ex wife states pt was told he would be contacted re: another infusion treatment but he has not heard from anyone, also pt states he is not on any medication. Please call pt

## 2021-08-15 NOTE — Telephone Encounter (Addendum)
I spoke to [place] in [place]. She is waiting for Dr. Pearlean Brownie to sign off on new orders for Vyvgart. Once approved, it will be submitted to his insurance plan and the medication will be ordered. The patient's ex-wife has been notified (ok per DPR).

## 2021-09-30 ENCOUNTER — Telehealth: Payer: Self-pay | Admitting: Cardiology

## 2021-09-30 NOTE — Telephone Encounter (Signed)
Pt c/o medication issue:  1. Name of Medication: sacubitril-valsartan (ENTRESTO) 49-51 MG  2. How are you currently taking this medication (dosage and times per day)? Not currently taking  3. Are you having a reaction (difficulty breathing--STAT)? no  4. What is your medication issue? Patient states he does qualify for entresto now

## 2021-09-30 NOTE — Telephone Encounter (Signed)
Left message for patient to return the call.

## 2021-10-01 ENCOUNTER — Other Ambulatory Visit: Payer: Self-pay

## 2021-10-01 MED ORDER — ENTRESTO 49-51 MG PO TABS: 1.0000 | ORAL_TABLET | Freq: Two times a day (BID) | ORAL | 3 refills | Status: DC

## 2021-10-01 NOTE — Telephone Encounter (Signed)
Called back, spoke with wife- she states his insurance will now pay for the [facility]- they needed it sent over to the pharmacy.  Verified pharmacy and sent RX.  No further questions/concerns at this time.

## 2021-10-01 NOTE — Telephone Encounter (Signed)
Pt returning nurse's call. Please advise

## 2021-10-09 ENCOUNTER — Ambulatory Visit: Payer: 59 | Admitting: Neurology

## 2021-11-07 ENCOUNTER — Ambulatory Visit (INDEPENDENT_AMBULATORY_CARE_PROVIDER_SITE_OTHER): Payer: 59 | Admitting: Family Medicine

## 2021-11-07 ENCOUNTER — Encounter: Payer: Self-pay | Admitting: Family Medicine

## 2021-11-07 VITALS — BP 156/86 | HR 76 | Resp 16 | Ht 68.0 in | Wt 228.1 lb

## 2021-11-07 DIAGNOSIS — E559 Vitamin D deficiency, unspecified: Secondary | ICD-10-CM

## 2021-11-07 DIAGNOSIS — I10 Essential (primary) hypertension: Secondary | ICD-10-CM | POA: Diagnosis not present

## 2021-11-07 DIAGNOSIS — R7301 Impaired fasting glucose: Secondary | ICD-10-CM | POA: Diagnosis not present

## 2021-11-07 DIAGNOSIS — R42 Dizziness and giddiness: Secondary | ICD-10-CM | POA: Diagnosis not present

## 2021-11-07 DIAGNOSIS — G7000 Myasthenia gravis without (acute) exacerbation: Secondary | ICD-10-CM | POA: Diagnosis not present

## 2021-11-07 DIAGNOSIS — E875 Hyperkalemia: Secondary | ICD-10-CM | POA: Diagnosis not present

## 2021-11-07 NOTE — Assessment & Plan Note (Addendum)
Advised to take carvedilol 12.5 mg BID Encouraged to take spironolactone 12.5 mg daily and Entresto 49-51 mg BID Encouraged to take lasix PRN Will reassess BP in  3 weeks

## 2021-11-07 NOTE — Assessment & Plan Note (Signed)
C/o of diplopia and blurred vision  Reports that he had an eye exam on February 2023 and was noted to have floaters. He stated that he was starting to have cataracts in both eyes. He follows up next year with the optometrist and is following up with neurology on 11/17/21

## 2021-11-07 NOTE — Assessment & Plan Note (Addendum)
Patient is taking Entresto 49-51 mg BID Will monitor for hyperkalemia  Pending labs

## 2021-11-07 NOTE — Patient Instructions (Addendum)
I appreciate the opportunity to provide care to you today!    Follow up:  3 weeks  Labs: please stop by the lab during the week to get your blood drawn (CBC, CMP, TSH, Lipid profile, HgA1c, Vit D)   Please take carvedilol 12.5 2 times daily   Please continue to a heart-healthy diet and increase your physical activities. Try to exercise for 70mins at least three times a week.      It was a pleasure to see you and I look forward to continuing to work together on your health and well-being. Please do not hesitate to call the office if you need care or have questions about your care.   Have a wonderful day and week. With Gratitude, Alvira Monday MSN, FNP-BC

## 2021-11-07 NOTE — Assessment & Plan Note (Signed)
Due to positional changes Encourage  to change positions slowly and to stay hydrated

## 2021-11-07 NOTE — Progress Notes (Addendum)
New Patient Office Visit  Subjective:  Patient ID: Tony Combs, male    DOB: [birth year]  Age: 61 y.o. MRN: [id number]  CC:  Chief Complaint  Patient presents with   Establish Care    HPI Tony Combs is a 61 y.o. male with past medical history of cardiomyopathy, stroke, essential hypertension, chronic combined systolic hypertensin, myasthenia gravis presents for establishing care.  Essential Hypertension: BP elevated today. While reviewing his medication, he noted that he only takes carvedilol 6.25 BID. Carvedilol was increased to 12.5 BID by his cardiologist on 05/20/21. He reports taking Entresto and spironolactone as prescribed. He denies chest pain, palpitations and reports headaches, dizziness and fatigue. He takes Lasix 20 mg PRN for peripheral edema.   Past Medical History:  Diagnosis Date   CAD in native artery 03/11/2021   Small Non-dominant RCA - prox 90% (CTA FFR 0.66) - Not favorable for PCI - small caliber, non-dominant (<2 mm) vessel.  Mininal diffuse disease in LCx.   Diabetes mellitus without complication ([place])    Dilated cardiomyopathy ([place]) 01/17/2021   In setting of stroke: TTE 08/31/2020: EF 30 and 35%.  Mildly decreased function.  Moderate LVH.  Unable to assess diastolic function.  Unable assess RV function.  Mild LA dilation.   Hyperlipidemia    Hypertension    Myasthenia gravis ([place]) 03/01/2021   Followed by Dr. Robyne Askew half dose Mestinon   Stroke ([place]) 08/30/2020   Right frontal lobe stroke with dysarthria and blurred vision.    Past Surgical History:  Procedure Laterality Date   Coronary CT Angiogram  01/31/2021   Coronary calcium score 40.  No CAD RADS 3.  Moderate stenosis.  RCA has moderate obstructive plaque in the mid and distal vessel.  Otherwise 25-49% OM1. => CT FFR positive, 0.66 in the RCA.   HERNIA REPAIR     LEFT HEART CATH AND CORONARY ANGIOGRAPHY N/A 03/11/2021   Procedure: LEFT HEART CATH AND CORONARY ANGIOGRAPHY;  Surgeon: Leonie Man, MD;  Location: [facility];;   Prox RCA lesion is 90% stenosed.-Small to moderate caliber nondominant vessel (<2 mm) => not PCI amenable.  Not the cause of cardiomyopathy.   Otherwise minimal diffuse CAD and a Left Dominant System.   LV EDP is normal with Systemic Hypertension (Central Aop ~20 mmHg > BP Cuff)   TRANSTHORACIC ECHOCARDIOGRAM  08/31/2020   a) EF 30 and 35%.  Mildly decreased function.  Moderate LVH.  Unable to assess diastolic function.  Unable assess RV function.  Mild LA dilation.; b) 12/21/2020: EF remains 30 to 35% with moderately reduced function.  Global HK.  GR 1 DD.  Normal RV size and function.  Normal aortic valves.  Cannot exclude small PFO. => 02/07/21 -> NEGATIVE BUBBLE STUDY.    Family History  Problem Relation Age of Onset   Hypertension Mother    Diabetes Mother    Heart failure Father 76   Heart attack Father 5   Hypertension Father    Diabetes type II Father    Coronary artery disease Father 70   Congestive Heart Failure Father    Diabetes Brother     Social History   Socioeconomic History   Marital status: Married    Spouse name: Not on file   Number of children: Not on file   Years of education: Not on file   Highest education level: Not on file  Occupational History   Not on file  Tobacco Use  Smoking status: Never   Smokeless tobacco: Never  Vaping Use   Vaping Use: Never used  Substance and Sexual Activity   Alcohol use: Not Currently   Drug use: Not Currently   Sexual activity: Not Currently  Other Topics Concern   Not on file  Social History Narrative   Not on file   Social Determinants of Health   Financial Resource Strain: Not on file  Food Insecurity: Not on file  Transportation Needs: Not on file  Physical Activity: Not on file  Stress: Not on file  Social Connections: Not on file  Intimate Partner Violence: Not on file    ROS Review of Systems  Constitutional:  Positive for fatigue. Negative for chills  and fever.  HENT:  Negative for sinus pressure, sinus pain and sore throat.   Eyes:  Positive for visual disturbance.  Respiratory:  Negative for chest tightness and shortness of breath.   Cardiovascular:  Negative for chest pain, palpitations and leg swelling.  Gastrointestinal:  Negative for constipation, diarrhea, nausea and vomiting.  Endocrine: Negative for polyphagia and polyuria.  Genitourinary:  Negative for frequency, hematuria and urgency.  Musculoskeletal:  Negative for gait problem.  Skin:  Negative for rash and wound.  Neurological:  Positive for dizziness and headaches.  Psychiatric/Behavioral:  Negative for self-injury and suicidal ideas.     Objective:   Today's Vitals: BP (!) 156/86   Pulse 76   Resp 16   Ht $R'5\' 8"'gh$  (1.727 m)   Wt 228 lb 1.9 oz (103.5 kg)   SpO2 96%   BMI 34.69 kg/m   Physical Exam HENT:     Right Ear: External ear normal.     Left Ear: External ear normal.     Nose: Nose normal.     Mouth/Throat:     Mouth: Mucous membranes are moist.  Eyes:     General: No visual field deficit.    Extraocular Movements: Extraocular movements intact.     Pupils: Pupils are equal, round, and reactive to light.  Cardiovascular:     Rate and Rhythm: Normal rate and regular rhythm.     Heart sounds: Normal heart sounds.  Pulmonary:     Effort: Pulmonary effort is normal.     Breath sounds: Normal breath sounds.  Abdominal:     Palpations: Abdomen is soft.     Tenderness: There is no right CVA tenderness or left CVA tenderness.  Musculoskeletal:     Cervical back: No rigidity.     Right lower leg: No edema.     Left lower leg: No edema.  Skin:    Findings: No lesion or rash.  Neurological:     Mental Status: He is alert and oriented to person, place, and time.     Cranial Nerves: No facial asymmetry.     Motor: No weakness, seizure activity or pronator drift.     Coordination: Romberg sign negative. Finger-Nose-Finger Test normal.     Gait: Gait  normal.     Assessment & Plan:   Problem List Items Addressed This Visit       Cardiovascular and Mediastinum   Essential hypertension - Primary (Chronic)    Advised to take carvedilol 12.5 mg BID Encouraged to take spironolactone 12.5 mg daily and Entresto 49-51 mg BID Encouraged to take lasix PRN Will reassess BP in  3 weeks         Nervous and Auditory   Myasthenia gravis (HCC)    C/o of diplopia  and blurred vision  Reports that he had an eye exam on February 2023 and was noted to have floaters. He stated that he was starting to have cataracts in both eyes. He follows up next year with the optometrist and is following up with neurology on 11/17/21          Other   Hyperkalemia (Chronic)    Patient is taking Entresto 49-51 mg BID Will monitor for hyperkalemia  Pending labs      Dizziness    Due to positional changes Encourage  to change positions slowly and to stay hydrated      Other Visit Diagnoses     IFG (impaired fasting glucose)       Relevant Orders   CBC with Differential/Platelet   CMP14+EGFR   TSH + free T4   Lipid Profile   Hemoglobin A1C   Vitamin D deficiency       Relevant Orders   Vitamin D (25 hydroxy)       Outpatient Encounter Medications as of 11/07/2021  Medication Sig   aspirin EC 81 MG tablet Take 81 mg by mouth in the morning. Swallow whole.   atorvastatin (LIPITOR) 80 MG tablet Take 1 tablet (80 mg total) by mouth daily.   carvedilol (COREG) 6.25 MG tablet Take 2 tablets (12.5 mg total) by mouth 2 (two) times daily with a meal.   Continuous Blood Gluc Sensor (FREESTYLE LIBRE 3 SENSOR) MISC Inject 1 applicator into the skin every 14 (fourteen) days.   insulin glargine, 1 Unit Dial, (TOUJEO) 300 UNIT/ML Solostar Pen Inject 20 Units into the skin at bedtime.   Insulin Pen Needle (PEN NEEDLES) 30G X 5 MM MISC 1 pen by Does not apply route 4 (four) times daily - after meals and at bedtime.   NOVOLOG FLEXPEN 100 UNIT/ML FlexPen  Inject 4-7 Units into the skin 3 (three) times daily with meals. (Patient taking differently: Inject 5-10 Units into the skin 3 (three) times daily with meals.)   sacubitril-valsartan (ENTRESTO) 49-51 MG Take 1 tablet by mouth 2 (two) times daily.   spironolactone (ALDACTONE) 25 MG tablet Take 0.5 tablets (12.5 mg total) by mouth daily.   B-D UF III MINI PEN NEEDLES 31G X 5 MM MISC SMARTSIG:1 Pre-Filled Pen Syringe SUB-Q 4 Times Daily   [DISCONTINUED] blood glucose meter kit and supplies Dispense based on patient and insurance preference. Use up to four times daily as directed. (FOR ICD-10 E10.9, E11.9).   [DISCONTINUED] efgartigimod alfa-fcab (VYVGART) 400 MG/20ML injection Inject 1,000 mg into the vein every 30 (thirty) days. (Patient not taking: Reported on 05/20/2021)   [DISCONTINUED] furosemide (LASIX) 20 MG tablet May take furosemide (Lasix )20 mg by mouth as needed daily  for increase swelling or shortness of breath and/or weight gain of 3 lb overnight and/or 5 lb over a week.   [DISCONTINUED] pyridostigmine (MESTINON) 60 MG tablet Take 0.5 tablets (30 mg total) by mouth 3 (three) times daily. Take 0.5 tablets 3 times daily for 1 week and then 1 tablet 3 times daily if tolerated (Patient not taking: Reported on 05/20/2021)   Facility-Administered Encounter Medications as of 11/07/2021  Medication   sodium chloride flush (NS) 0.9 % injection 10 mL    Follow-up: Return in about 3 weeks (around 11/28/2021).   Alvira Monday, FNP

## 2021-11-08 DIAGNOSIS — R7301 Impaired fasting glucose: Secondary | ICD-10-CM | POA: Diagnosis not present

## 2021-11-08 DIAGNOSIS — E559 Vitamin D deficiency, unspecified: Secondary | ICD-10-CM | POA: Diagnosis not present

## 2021-11-09 LAB — CMP14+EGFR
ALT: 23 IU/L (ref 0–44)
AST: 16 IU/L (ref 0–40)
Albumin/Globulin Ratio: 1.5 (ref 1.2–2.2)
Albumin: 4 g/dL (ref 3.9–4.9)
Alkaline Phosphatase: 86 IU/L (ref 44–121)
BUN/Creatinine Ratio: 16 (ref 10–24)
BUN: 13 mg/dL (ref 8–27)
Bilirubin Total: 0.6 mg/dL (ref 0.0–1.2)
CO2: 25 mmol/L (ref 20–29)
Calcium: 9 mg/dL (ref 8.6–10.2)
Chloride: 102 mmol/L (ref 96–106)
Creatinine, Ser: 0.82 mg/dL (ref 0.76–1.27)
Globulin, Total: 2.7 g/dL (ref 1.5–4.5)
Glucose: 157 mg/dL — ABNORMAL HIGH (ref 70–99)
Potassium: 4.8 mmol/L (ref 3.5–5.2)
Sodium: 140 mmol/L (ref 134–144)
Total Protein: 6.7 g/dL (ref 6.0–8.5)
eGFR: 99 mL/min/{1.73_m2} (ref >59)

## 2021-11-09 LAB — TSH+FREE T4
Free T4: 1.2 ng/dL (ref 0.82–1.77)
TSH: 1.29 u[IU]/mL (ref 0.450–4.500)

## 2021-11-09 LAB — CBC WITH DIFFERENTIAL/PLATELET
Basophils Absolute: 0 10*3/uL (ref 0.0–0.2)
Basos: 0 %
EOS (ABSOLUTE): 0.2 10*3/uL (ref 0.0–0.4)
Eos: 3 %
Hematocrit: 40 % (ref 37.5–51.0)
Hemoglobin: 13.3 g/dL (ref 13.0–17.7)
Immature Grans (Abs): 0 10*3/uL (ref 0.0–0.1)
Immature Granulocytes: 0 %
Lymphocytes Absolute: 1.7 10*3/uL (ref 0.7–3.1)
Lymphs: 29 %
MCH: 28.2 pg (ref 26.6–33.0)
MCHC: 33.3 g/dL (ref 31.5–35.7)
MCV: 85 fL (ref 79–97)
Monocytes Absolute: 0.4 10*3/uL (ref 0.1–0.9)
Monocytes: 6 %
Neutrophils Absolute: 3.6 10*3/uL (ref 1.4–7.0)
Neutrophils: 62 %
Platelets: 184 10*3/uL (ref 150–450)
RBC: 4.72 x10E6/uL (ref 4.14–5.80)
RDW: 12.5 % (ref 11.6–15.4)
WBC: 5.8 10*3/uL (ref 3.4–10.8)

## 2021-11-09 LAB — HEMOGLOBIN A1C
Est. average glucose Bld gHb Est-mCnc: 209 mg/dL
Hgb A1c MFr Bld: 8.9 % — ABNORMAL HIGH (ref 4.8–5.6)

## 2021-11-09 LAB — VITAMIN D 25 HYDROXY (VIT D DEFICIENCY, FRACTURES): Vit D, 25-Hydroxy: 42.3 ng/mL (ref 30.0–100.0)

## 2021-11-09 LAB — LIPID PANEL
Chol/HDL Ratio: 3.6 ratio (ref 0.0–5.0)
Cholesterol, Total: 122 mg/dL (ref 100–199)
HDL: 34 mg/dL — ABNORMAL LOW (ref >39)
LDL Chol Calc (NIH): 59 mg/dL (ref 0–99)
Triglycerides: 170 mg/dL — ABNORMAL HIGH (ref 0–149)
VLDL Cholesterol Cal: 29 mg/dL (ref 5–40)

## 2021-11-11 ENCOUNTER — Other Ambulatory Visit: Payer: Self-pay | Admitting: Family Medicine

## 2021-11-14 ENCOUNTER — Telehealth: Payer: Self-pay | Admitting: Family Medicine

## 2021-11-14 NOTE — Telephone Encounter (Signed)
Returning call.

## 2021-11-15 NOTE — Telephone Encounter (Signed)
No calls have been made to pt.

## 2021-11-18 ENCOUNTER — Other Ambulatory Visit: Payer: Self-pay | Admitting: Family Medicine

## 2021-11-18 NOTE — Progress Notes (Signed)
Please inform the patient that his hga1c is 8.9. I recommended continuing his insulin therapy, decreasing his intake of high-sugar foods, and following up with his endocrinologist as scheduled. His triglyceride is elevated, and I recommend otc fish oil supplement 2000mg  BID. I also recommend increasing his intake of fresh fruits and vegetables to increase his HDL levels.

## 2021-11-28 ENCOUNTER — Encounter: Payer: Self-pay | Admitting: Family Medicine

## 2021-11-28 ENCOUNTER — Ambulatory Visit (INDEPENDENT_AMBULATORY_CARE_PROVIDER_SITE_OTHER): Payer: 59 | Admitting: Family Medicine

## 2021-11-28 VITALS — BP 134/82 | HR 79 | Wt 232.0 lb

## 2021-11-28 DIAGNOSIS — E7849 Other hyperlipidemia: Secondary | ICD-10-CM | POA: Diagnosis not present

## 2021-11-28 DIAGNOSIS — E038 Other specified hypothyroidism: Secondary | ICD-10-CM

## 2021-11-28 DIAGNOSIS — E559 Vitamin D deficiency, unspecified: Secondary | ICD-10-CM

## 2021-11-28 DIAGNOSIS — Z794 Long term (current) use of insulin: Secondary | ICD-10-CM | POA: Diagnosis not present

## 2021-11-28 DIAGNOSIS — E0800 Diabetes mellitus due to underlying condition with hyperosmolarity without nonketotic hyperglycemic-hyperosmolar coma (NKHHC): Secondary | ICD-10-CM

## 2021-11-28 DIAGNOSIS — Z23 Encounter for immunization: Secondary | ICD-10-CM

## 2021-11-28 DIAGNOSIS — I10 Essential (primary) hypertension: Secondary | ICD-10-CM | POA: Diagnosis not present

## 2021-11-28 DIAGNOSIS — R7301 Impaired fasting glucose: Secondary | ICD-10-CM | POA: Diagnosis not present

## 2021-11-28 NOTE — Progress Notes (Signed)
Established Patient Office Visit  Subjective:  Patient ID: Tony Combs, male    DOB: 10/27/1960  Age: 61 y.o. MRN: [id number]  CC:  Chief Complaint  Patient presents with   Follow-up    HPI Tony Combs is a 61 y.o. male with past medical history of acute CVA, essential hypertension, and diabetes, and pulmonary nodule presents for f/u of  chronic medical conditions.  Hypertension: He takes carvedilol 12.5 twice daily,Spironolactone 12.5 mg daily and Entresto 49- 51 mg twice daily.  He denies dizziness, shortness of breath, and palpitations.  He received his Tdap and shingles vaccine today. 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  Past Medical History:  Diagnosis Date   CAD in native artery 03/11/2021   Small Non-dominant RCA - prox 90% (CTA FFR 0.66) - Not favorable for PCI - small caliber, non-dominant (<2 mm) vessel.  Mininal diffuse disease in LCx.   Diabetes mellitus without complication ([facility])    Dilated cardiomyopathy ([facility]) 01/17/2021   In setting of stroke: TTE 08/31/2020: EF 30 and 35%.  Mildly decreased function.  Moderate LVH.  Unable to assess diastolic function.  Unable assess RV function.  Mild LA dilation.   Hyperlipidemia    Hypertension    Myasthenia gravis ([facility]) 03/01/2021   Followed by Dr. 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    Past Surgical History:  Procedure Laterality Date   Coronary CT Angiogram  01/31/2021   Coronary calcium score 40.  No CAD RADS 3.  Moderate stenosis.  RCA has moderate obstructive plaque in the mid and distal vessel.  Otherwise 25-49% OM1. => CT FFR positive, 0.66 in the RCA.   HERNIA REPAIR     LEFT HEART CATH AND CORONARY ANGIOGRAPHY N/A 03/11/2021   Procedure: LEFT HEART CATH AND CORONARY ANGIOGRAPHY;  Surgeon: [name], MD;  Location: [facility];;   Prox RCA lesion is 90%  stenosed.-Small to moderate caliber nondominant vessel (<2 mm) => not PCI amenable.  Not the cause of cardiomyopathy.   Otherwise minimal diffuse CAD and a Left Dominant System.   LV EDP is normal with Systemic Hypertension (Central Aop ~20 mmHg > BP Cuff)   TRANSTHORACIC ECHOCARDIOGRAM  08/31/2020   a) EF 30 and 35%.  Mildly decreased function.  Moderate LVH.  Unable to assess diastolic function.  Unable assess RV function.  Mild LA dilation.; b) 12/21/2020: EF remains 30 to 35% with moderately reduced function.  Global HK.  GR 1 DD.  Normal RV size and function.  Normal aortic valves.  Cannot exclude small PFO. => 02/07/21 -> NEGATIVE BUBBLE STUDY.    Family History  Problem Relation Age of Onset   Hypertension Mother    Diabetes Mother    Heart failure Father 3   Heart attack Father 36   Hypertension Father    Diabetes type II Father    Coronary artery disease Father 38   Congestive Heart Failure Father    Diabetes Brother     Social History   Socioeconomic History   Marital status: Married    Spouse name: Not on file   Number of children: Not on file   Years of education: Not on file   Highest education level: Not on file  Occupational History   Not on file  Tobacco Use   Smoking status: Never  Smokeless tobacco: Never  Vaping Use   Vaping Use: Never used  Substance and Sexual Activity   Alcohol use: Not Currently   Drug use: Not Currently   Sexual activity: Not Currently  Other Topics Concern   Not on file  Social History Narrative   Not on file   Social Determinants of Health   Financial Resource Strain: Not on file  Food Insecurity: Not on file  Transportation Needs: Not on file  Physical Activity: Not on file  Stress: Not on file  Social Connections: Not on file  Intimate Partner Violence: Not on file    Outpatient Medications Prior to Visit  Medication Sig Dispense Refill   aspirin EC 81 MG tablet Take 81 mg by mouth in the morning. Swallow whole.      atorvastatin (LIPITOR) 80 MG tablet Take 1 tablet (80 mg total) by mouth daily. 30 tablet 0   B-D UF III MINI PEN NEEDLES 31G X 5 MM MISC SMARTSIG:1 Pre-Filled Pen Syringe SUB-Q 4 Times Daily     Continuous Blood Gluc Sensor (FREESTYLE LIBRE 3 SENSOR) MISC Inject 1 applicator into the skin every 14 (fourteen) days. 2 each 2   insulin glargine, 1 Unit Dial, (TOUJEO) 300 UNIT/ML Solostar Pen Inject 20 Units into the skin at bedtime. 1.5 mL 0   Insulin Pen Needle (PEN NEEDLES) 30G X 5 MM MISC 1 pen by Does not apply route 4 (four) times daily - after meals and at bedtime. 200 each 1   NOVOLOG FLEXPEN 100 UNIT/ML FlexPen Inject 4-7 Units into the skin 3 (three) times daily with meals. (Patient taking differently: Inject 5-10 Units into the skin 3 (three) times daily with meals.) 15 mL 3   sacubitril-valsartan (ENTRESTO) 49-51 MG Take 1 tablet by mouth 2 (two) times daily. 180 tablet 3   carvedilol (COREG) 6.25 MG tablet Take 2 tablets (12.5 mg total) by mouth 2 (two) times daily with a meal. 120 tablet 0   spironolactone (ALDACTONE) 25 MG tablet Take 0.5 tablets (12.5 mg total) by mouth daily. 45 tablet 3   Facility-Administered Medications Prior to Visit  Medication Dose Route Frequency Provider Last Rate Last Admin   sodium chloride flush (NS) 0.9 % injection 10 mL  10 mL Intravenous PRN Leonie Man, MD   20 mL at 02/07/21 0925    No Known Allergies  ROS Review of Systems  Constitutional:  Negative for fatigue and fever.  Respiratory:  Negative for chest tightness and shortness of breath.   Cardiovascular:  Negative for chest pain and palpitations.  Neurological:  Negative for dizziness and headaches.      Objective:    Physical Exam HENT:     Head: Normocephalic.  Cardiovascular:     Rate and Rhythm: Normal rate and regular rhythm.     Pulses: Normal pulses.     Heart sounds: Normal heart sounds.  Pulmonary:     Effort: Pulmonary effort is normal.     Breath sounds: Normal  breath sounds.  Neurological:     Mental Status: He is alert.     BP 134/82 (BP Location: Right Arm, Patient Position: Sitting)   Pulse 79   Wt 232 lb (105.2 kg)   SpO2 95%   BMI 35.28 kg/m  Wt Readings from Last 3 Encounters:  11/28/21 232 lb (105.2 kg)  11/07/21 228 lb 1.9 oz (103.5 kg)  07/04/21 229 lb 6.4 oz (104.1 kg)    Lab Results  Component Value Date  TSH 1.290 11/08/2021   Lab Results  Component Value Date   WBC 5.8 11/08/2021   HGB 13.3 11/08/2021   HCT 40.0 11/08/2021   MCV 85 11/08/2021   PLT 184 11/08/2021   Lab Results  Component Value Date   NA 140 11/08/2021   K 4.8 11/08/2021   CO2 25 11/08/2021   GLUCOSE 157 (H) 11/08/2021   BUN 13 11/08/2021   CREATININE 0.82 11/08/2021   BILITOT 0.6 11/08/2021   ALKPHOS 86 11/08/2021   AST 16 11/08/2021   ALT 23 11/08/2021   PROT 6.7 11/08/2021   ALBUMIN 4.0 11/08/2021   CALCIUM 9.0 11/08/2021   ANIONGAP 5 09/02/2020   EGFR 99 11/08/2021   Lab Results  Component Value Date   CHOL 122 11/08/2021   Lab Results  Component Value Date   HDL 34 (L) 11/08/2021   Lab Results  Component Value Date   LDLCALC 59 11/08/2021   Lab Results  Component Value Date   TRIG 170 (H) 11/08/2021   Lab Results  Component Value Date   CHOLHDL 3.6 11/08/2021   Lab Results  Component Value Date   HGBA1C 8.9 (H) 11/08/2021      Assessment & Plan:   Problem List Items Addressed This Visit       Cardiovascular and Mediastinum   Essential hypertension - Primary (Chronic)    Encouraged to continue taking carvedilol 12.5 twice daily,Spironolactone 12.5 mg daily and Entresto 49- 51 mg twice daily No changes to medication regimen  The patient next appointment is in December We order basic labs today with instructions to get labs 3 to 4 days before his next appointment       Relevant Orders   CBC with Differential/Platelet   CMP14+EGFR     Endocrine   DM (diabetes mellitus) ([place])   Relevant Orders    Hemoglobin A1C   Other Visit Diagnoses     Vitamin D deficiency       Relevant Orders   Vitamin D (25 hydroxy)   Other specified hypothyroidism       Relevant Orders   TSH + free T4   IFG (impaired fasting glucose)       Other hyperlipidemia       Relevant Orders   Lipid Profile   Need for tetanus, diphtheria, and acellular pertussis (Tdap) vaccine       Relevant Orders   Tdap vaccine greater than or equal to 7yo IM   Need for shingles vaccine       Relevant Orders   Varicella-zoster vaccine IM       No orders of the defined types were placed in this encounter.   Follow-up: No follow-ups on file.    Alvira Monday, FNP

## 2021-11-28 NOTE — Patient Instructions (Signed)
I appreciate the opportunity to provide care to you today!    Follow up:  3 months  Fasting Labs: please stop by the lab 3-4 days before your next appointment to get your blood drawn (CBC, CMP, TSH, Lipid profile, HgA1c, Vit D)   Thank you for getting your Tdap and shingles vaccine today   Please continue to a heart-healthy diet and increase your physical activities. Try to exercise for 110mins at least three times a week.      It was a pleasure to see you and I look forward to continuing to work together on your health and well-being. Please do not hesitate to call the office if you need care or have questions about your care.   Have a wonderful day and week. With Gratitude, Alvira Monday MSN, FNP-BC

## 2021-11-28 NOTE — Assessment & Plan Note (Addendum)
Encouraged to continue taking carvedilol 12.5 twice daily,Spironolactone 12.5 mg daily and Entresto 49- 51 mg twice daily No changes to medication regimen  The patient next appointment is in December We order basic labs today with instructions to get labs 3 to 4 days before his next appointment

## 2021-12-11 ENCOUNTER — Telehealth: Payer: Self-pay

## 2021-12-11 NOTE — Telephone Encounter (Signed)
Received medical records request for [place]. Sent to [employer]

## 2021-12-17 ENCOUNTER — Ambulatory Visit: Payer: 59 | Admitting: Neurology

## 2021-12-17 VITALS — BP 153/87 | HR 73 | Ht 68.0 in | Wt 232.0 lb

## 2021-12-17 DIAGNOSIS — G7000 Myasthenia gravis without (acute) exacerbation: Secondary | ICD-10-CM

## 2021-12-17 DIAGNOSIS — Z8673 Personal history of transient ischemic attack (TIA), and cerebral infarction without residual deficits: Secondary | ICD-10-CM | POA: Diagnosis not present

## 2021-12-17 DIAGNOSIS — G4733 Obstructive sleep apnea (adult) (pediatric): Secondary | ICD-10-CM

## 2021-12-17 NOTE — Progress Notes (Signed)
[facility] [address] [place]. [place] [address] [phone]       OFFICE FOLLOW-UP NOTE  Tony Combs Date of Birth:  27-Sep-1960 Medical Record Number:  [id number]   HPI: Last visit 12/06/2020 Tony Combs is a 61 year old Caucasian male seen today for initial office follow-up visit following hospital consultation for stroke in July 2022.  History is obtained from the patient and his wife and review of electronic medical records and I personally reviewed pertinent imaging films in PACS.  He has past medical history of hypertension, hyperlipidemia, diabetes, coronary artery disease who presented to [facility] ED on 08/31/2020 for sudden onset of slurred speech with visual blurring and double vision.  Symptoms began the day prior to admission she went to [facility] where his noncontrast CT scan of the head was done which was unremarkable but before seeing the doctor she left [place].  She presented to [facility], ED the next day with complaints of persistent slurred speech with headache and blurred and double vision.  He also noted right side of the face felt weak and tingling.  He also felt off balance and dizzy at work but denies any focal extremity weakness.  MRI scan of the brain showed a 12 mm acute infarct in the anterior medial right frontal subcortical white matter.  Remote age tiny chronic left thalamic infarct was also noted.  MR angiogram study of the brain showed multifocal stenoses.with mild-to-moderate stenosis within the distal A2 segment of the right anterior cerebral artery. Severe stenosis within the left posterior cerebral artery at the P1/P2 junction. and MRA of the neck was unremarkable.  2D echo showed diminished ejection fraction of 30 to 35% but no clot.  LDL cholesterol 61 mg percent and hemoglobin A1c was elevated at 11.7.  Patient was felt to be at high risk for sleep apnea and offered participation in the sleep smart study for  stroke prevention and agreed to participate.  He was randomized to this CPAP treatment for his sleep apnea.  On exam patient was felt to have dysarthric and hypophonic speech with subtle ptosis of his left eye with left pupil being smaller in size in the right and because of his subjective complaints of diplopia, dysarthria and gait ataxia felt to have a small brainstem infarct not visualized on the MRI in addition to which was seen in the right subcortical region which could not explain his symptoms.  He was discharged on aspirin and Plavix for 3 months followed by aspirin alone.  Patient states that is obtained some improvement but his wife still becomes soft and slurred when he is tired.  Is also has some intermittent swallowing difficulties.  He also complains of persistent blurred and double vision particularly towards the end of the day.  His mornings are much better.  His balance is still not right so uses a cane to walk.  He has had no falls or injuries.  His wife has noted that at the end of the day his eyes are not synchronous and the left eye is does not move as much as the right.  Patient is tolerating aspirin well without bruising or bleeding.  His blood pressure is well controlled today it is 129/75.  His sugars are doing better and hemoglobin A1c is now down to 7.  His average blood glucose for the last 10 days is 142.  He remains on Lipitor which is tolerating well without side effects.  He has  been compliant with using his CPAP mask which he got through the sleep smart study and feels it is helping him.  Update 02/26/2021 : He returns for follow-up after last visit 2-1/2 months ago.  Is recommended by his wife.  Patient continues to have speech and swallowing difficulties which is more or less unchanged.  In his diet his speech often becomes slurred.  He denies any double vision but wife does notice some drooping eyelids to the end of the day.  He also has trouble swallowing at times.  Patient did  not tolerate Mestinon due to cramps and diarrhea and he takes only 30 mg once a day he is not sure if this is helping him.  He denies any trouble getting out of chair or walking or weakness of proximal shoulder or thigh muscles.  Lab work on 12/06/2020 shows strongly positive acetylcholine receptor binding antibody titer of 3.62 as well as acetylcholine blocking antibody in a titer of 95.  He previously had CT scan of the chest done on 08/30/2020 which did not show any thymoma but showed some mediastinal lymphadenopathy.  Hemoglobin A1c done on 02/19/2021 was elevated at 7.1.  EMG nerve conduction study with rapid repetitive stimulation on 01/24/2021 was positive for decrement confirming diagnosis of myasthenia.  It also showed evidence of moderate right carpal tunnel and right ulnar nerve entrapment at the elbow.  Patient does complain of hand paresthesias and some grip weakness.  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 Update 07/04/2021 ; He returns for follow-up after last visit 4 months ago.  He is accompanied by his exposures.  Patient states he is finished for injections of Vyvgart at 1 weekly intervals on 05/15/2021 and has noticed improvement in his speech and swallowing.  He does occasionally get intermittent diplopia which is transient.  He denies any drooping of eyelids or significant dysphagia or muscle weakness or fatigability.H his speech has remained quite good over the last 1 month despite not having injections.  He had trouble tolerating Mestinon due to diarrhea in the past and is not a good candidate for long-term steroids given his obesity and comorbidities.  He has had no recurrent stroke or TIA symptoms.  He remains on aspirin which is tolerating well without bruising or bleeding and Lipitor without muscle aches and pains.  He states has not been quite regular using his CPAP for his sleep apnea.  His blood pressures under good control today it is 139/73.  His  diabetes is yet suboptimally controlled with last A1c being 8.2.  He has no new complaints. Update 12/17/2021 : He returns for follow-up after last visit 5 months ago.  He is accompanied by his wife.  Patient continues to have symptoms of his myasthenia, generalized fatigability and tiredness symptoms have diurnal fluctuation and appears more prominent when he is tired in the meantime.  Eating as well.  He voids also.  Patient has trouble tolerating due to side effects and does not want to take steroids or  CellCept due to his uncontrolled diabetes and medical comorbidities.  He was treated with 2 injections of Vygardt quite substantial improvement in all the symptoms of myasthenia that he was having and this was sustained for around 2 months but his symptoms have returned since then.  He can barely look up without seeing double with his diet to a midday and has trouble doing his meals and his wife today.  Insurance company refused further  injections we will try Rystiggo instead at this time.  Continues to do well from stroke standpoint without recurrent stroke or TIA symptoms.  He remains on aspirin which is tolerating well without side effects.  He is tolerating Lipitor well and last lipid profile on 11/08/2021 showed LDL cholesterol to be optimal at 59.  His diabetes remains poorly controlled and last A1c was 8.9.  He is not too compliant with his CPAP for his sleep apnea and I counseled him.  ROS:   14 system review of systems is positive for blurred vision, double vision, soft voice, dysphagia, tiredness, fatigue, imbalance of gait, hand tingling, numbness and weakness and all other systems negative  PMH:  Past Medical History:  Diagnosis Date   CAD in native artery 03/11/2021   Small Non-dominant RCA - prox 90% (CTA FFR 0.66) - Not favorable for PCI - small caliber, non-dominant (<2 mm) vessel.  Mininal diffuse disease in LCx.   Diabetes mellitus without complication (HCC)    Dilated cardiomyopathy  (HCC) 01/17/2021   In setting of stroke: TTE 08/31/2020: EF 30 and 35%.  Mildly decreased function.  Moderate LVH.  Unable to assess diastolic function.  Unable assess RV function.  Mild LA dilation.   Hyperlipidemia    Hypertension    Myasthenia gravis (HCC) 03/01/2021   Followed by Dr. 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    Social History:  Social History   Socioeconomic History   Marital status: Married    Spouse name: Not on file   Number of children: Not on file   Years of education: Not on file   Highest education level: Not on file  Occupational History   Not on file  Tobacco Use   Smoking status: Never   Smokeless tobacco: Never  Vaping Use   Vaping Use: Never used  Substance and Sexual Activity   Alcohol use: Not Currently   Drug use: Not Currently   Sexual activity: Not Currently  Other Topics Concern   Not on file  Social History Narrative   Not on file   Social Determinants of Health   Financial Resource Strain: Not on file  Food Insecurity: Not on file  Transportation Needs: Not on file  Physical Activity: Not on file  Stress: Not on file  Social Connections: Not on file  Intimate Partner Violence: Not on file    Medications:   Current Outpatient Medications on File Prior to Visit  Medication Sig Dispense Refill   aspirin EC 81 MG tablet Take 81 mg by mouth in the morning. Swallow whole.     atorvastatin (LIPITOR) 80 MG tablet Take 1 tablet (80 mg total) by mouth daily. 30 tablet 0   B-D UF III MINI PEN NEEDLES 31G X 5 MM MISC SMARTSIG:1 Pre-Filled Pen Syringe SUB-Q 4 Times Daily     Continuous Blood Gluc Sensor (FREESTYLE LIBRE 3 SENSOR) MISC Inject 1 applicator into the skin every 14 (fourteen) days. 2 each 2   insulin glargine, 1 Unit Dial, (TOUJEO) 300 UNIT/ML Solostar Pen Inject 20 Units into the skin at bedtime. 1.5 mL 0   Insulin Pen Needle (PEN NEEDLES) 30G X 5  MM MISC 1 pen by Does not apply route 4 (four) times daily - after meals and at bedtime. 200 each 1   NOVOLOG FLEXPEN 100 UNIT/ML FlexPen Inject 4-7 Units into the skin 3 (three) times daily with meals. (Patient  taking differently: Inject 5-10 Units into the skin 3 (three) times daily with meals.) 15 mL 3   sacubitril-valsartan (ENTRESTO) 49-51 MG Take 1 tablet by mouth 2 (two) times daily. 180 tablet 3   carvedilol (COREG) 6.25 MG tablet Take 2 tablets (12.5 mg total) by mouth 2 (two) times daily with a meal. 120 tablet 0   spironolactone (ALDACTONE) 25 MG tablet Take 0.5 tablets (12.5 mg total) by mouth daily. 45 tablet 3   Current Facility-Administered Medications on File Prior to Visit  Medication Dose Route Frequency Provider Last Rate Last Admin   sodium chloride flush (NS) 0.9 % injection 10 mL  10 mL Intravenous PRN [name], MD   20 mL at 02/07/21 2595    Allergies:  No Known Allergies  Physical Exam General: Obese middle-aged Caucasian male, seated, in no evident distress Head: head normocephalic and atraumatic.  Neck: supple with no carotid or supraclavicular bruits Cardiovascular: regular rate and rhythm, no murmurs Musculoskeletal: no deformity Skin:  no rash/petichiae Vascular:  Normal pulses all extremities Vitals:   12/17/21 1252  BP: (!) 153/87  Pulse: 73   Neurologic Exam Mental Status: Awake and fully alert. Oriented to place and time. Recent and remote memory intact. Attention span, concentration and fund of knowledge appropriate. Mood and affect appropriate.  Voice seems normal today.  No dysarthria. Cranial Nerves: Fundoscopic exam not done. Pupils equal, briskly reactive to light. Extraocular movements full without nystagmus. Visual fields full to confrontation. Hearing intact. Facial sensation intact. Face, tongue, palate moves normally and symmetrically.  Mild drooping of both eyelids on sustained upgaze but without subjective diplopia and no  ophthalmoplegia. Motor: Normal bulk and tone. Normal strength in all tested extremity muscles. Sensory.: intact to touch ,pinprick .position and vibratory sensation.  Coordination: Rapid alternating movements normal in all extremities. Finger-to-nose and heel-to-shin performed accurately bilaterally. Gait and Station: Arises from chair without difficulty. Stance is normal. Gait demonstrates normal stride length and balance .  Unable to heel, toe and tandem walk without difficulty.  Reflexes: 1+ and symmetric. Toes downgoing.      ASSESSMENT: 61 year old Caucasian male with sudden onset of slurred speech, diplopia and dysphagia in July 2022 likely from small brainstem infarct not visualized on MRI from small vessel disease.  MRI at that time had shown also right periventricular white matter infarct which was probably silent.  Multiple vascular risk factors of hypertension, diabetes, hyperlipidemia obesity and sleep apnea.  Patient has been randomized to the CPAP treatment arm of the sleep smart study.   Ongoing symptoms of increased fatigability tiredness and worsening speech and swallowing from antibody positive  myasthenia which seem to have responded to Vyvgart infusions but insurance company has refused further injections.Marland Kitchen     PLAN:I had a long d/w patient about his recent stroke,myasthenia gravis, risk for recurrent stroke/TIAs, personally independently reviewed imaging studies and stroke evaluation results and answered questions.Continue aspirin 81 mg daily  for secondary stroke prevention and maintain strict control of hypertension with blood pressure goal below 130/90, diabetes with hemoglobin A1c goal below 6.5% and lipids with LDL cholesterol goal below 70 mg/dL. I also advised the patient to eat a healthy diet with plenty of whole grains, cereals, fruits and vegetables, exercise regularly and maintain ideal body weight .I counseled the patient that the complex disease CPAP.  Check screening  carotid ultrasound study.  Patient did obtain improvement in his myasthenia symptoms with Vygart but insurance company is denying additional doses switch to Rystiggo instead  if approved.he has not tolerated Mestinon does not want to try prednisone followup in the future with my nurse practitioner in 3 months or call earlier if necessary. .Greater than 50% of time during this 45 minute visit was spent on counseling,explanation of diagnosis, planning of further management, discussion with patient and family and coordination of care Delia Heady, MD Note: This document was prepared with digital dictation and possible smart phrase technology. Any transcriptional errors that result from this process are unintentional

## 2021-12-17 NOTE — Patient Instructions (Signed)
I had a long d/w patient about his recent stroke,myasthenia gravis, risk for recurrent stroke/TIAs, personally independently reviewed imaging studies and stroke evaluation results and answered questions.Continue aspirin 81 mg daily  for secondary stroke prevention and maintain strict control of hypertension with blood pressure goal below 130/90, diabetes with hemoglobin A1c goal below 6.5% and lipids with LDL cholesterol goal below 70 mg/dL. I also advised the patient to eat a healthy diet with plenty of whole grains, cereals, fruits and vegetables, exercise regularly and maintain ideal body weight .I counseled the patient that the complex disease CPAP.  Check screening carotid ultrasound study.  Patient did obtain improvement in his myasthenia symptoms with Vygart but insurance company is denying additional doses switch to Rystiggo instead if approved.he has not tolerated Mestinon does not want to try prednisone followup in the future with my nurse practitioner in 3 months or call earlier if necessary.  Rozanolixizumab Injection What is this medication? ROZANOLIXIZUMAB (roz AN oh lix IZ ue mab) treats myasthenia gravis, a condition that causes muscles to easily weaken or fatigue. It works by decreasing muscle weakness and loss of movement. It is a monoclonal antibody. This medicine may be used for other purposes; ask your health care provider or pharmacist if you have questions. COMMON BRAND NAME(S): RYSTIGGO What should I tell my care team before I take this medication? They need to know if you have any of these conditions: Infection An unusual or allergic reaction to rozanolixizumab, other medications, foods, dyes, or preservatives Pregnant or trying to get pregnant Breastfeeding How should I use this medication? This medication is infused under the skin. It is given by your care team in a hospital or clinic setting. Talk to your care team about the use of this medication in children. Special care  may be needed. Overdosage: If you think you have taken too much of this medicine contact a poison control center or emergency room at once. NOTE: This medicine is only for you. Do not share this medicine with others. What if I miss a dose? Keep appointments for follow-up doses. It is important not to miss your dose. Call your care team if you are unable to keep an appointment. What may interact with this medication? Efgartigimod alfa Live virus vaccines This list may not describe all possible interactions. Give your health care provider a list of all the medicines, herbs, non-prescription drugs, or dietary supplements you use. Also tell them if you smoke, drink alcohol, or use illegal drugs. Some items may interact with your medicine. What should I watch for while using this medication? Visit your care team for regular checks on your progress. Tell your care team if your symptoms do not start to get better or if they get worse. This medication may increase your risk of getting an infection. Call your care team for advice if you get a fever, chills, sore throat, or other symptoms of a cold or flu. Do not treat yourself. Try to avoid being around people who are sick. Discuss this medication with your care team if you may be pregnant. There are benefits and risks to taking medications during pregnancy. Your care team can help you find the option that works for you. Talk to your care team before breastfeeding. Changes to your treatment plan may be needed. What side effects may I notice from receiving this medication? Side effects that you should report to your care team as soon as possible: Allergic reactions or angioedema--skin rash, itching or hives, swelling of  the face, eyes, lips, tongue, arms, or legs, trouble swallowing or breathing Fever, neck pain or stiffness, sensitivity to light, headache, nausea, vomiting, confusion Infection--fever, chills, cough, sore throat, wounds that don't heal, pain  or trouble when passing urine, general feeling of discomfort or being unwell Side effects that usually do not require medical attention (report these to your care team if they continue or are bothersome): Diarrhea Headache Nausea This list may not describe all possible side effects. Call your doctor for medical advice about side effects. You may report side effects to FDA at 1-800-FDA-1088. Where should I keep my medication? This medication is given in a hospital or clinic. It will not be stored at home. NOTE: This sheet is a summary. It may not cover all possible information. If you have questions about this medicine, talk to your doctor, pharmacist, or health care provider.  2023 Elsevier/Gold Standard (2021-08-21 00:00:00)

## 2021-12-23 ENCOUNTER — Telehealth: Payer: Self-pay | Admitting: Nurse Practitioner

## 2021-12-23 NOTE — Telephone Encounter (Signed)
No, needs OV first

## 2021-12-23 NOTE — Telephone Encounter (Signed)
Pt asked for a refill on his sensors. He does not have an appt coming up and his last appt was January. Please Advise

## 2021-12-23 NOTE — Telephone Encounter (Signed)
I called pt and left a VM of this information.

## 2021-12-24 ENCOUNTER — Telehealth: Payer: Self-pay

## 2021-12-24 NOTE — Telephone Encounter (Signed)
Rystiggo Start form has been faxed to intake team. Fax # [phone], confirmation received.

## 2021-12-25 ENCOUNTER — Ambulatory Visit: Payer: 59 | Attending: Neurology

## 2021-12-25 NOTE — Telephone Encounter (Signed)
We have received feedback from intake team. Pt has been approved for copay assistance from 12/24/2021-02/16/2022.

## 2021-12-27 NOTE — Progress Notes (Signed)
Kindly inform the patient that carotid ultrasound study did not show any significant blockage on either carotid artery in the neck

## 2021-12-30 ENCOUNTER — Telehealth: Payer: Self-pay

## 2021-12-30 NOTE — Telephone Encounter (Signed)
-----   Message from Micki Riley, MD sent at 12/27/2021  4:13 PM EST ----- Kindly inform the patient that carotid ultrasound study did not show any significant blockage on either carotid artery in the neck

## 2021-12-30 NOTE — Telephone Encounter (Signed)
I called patient.  I advised him that his carotid ultrasound study did not show any significant blockage on either carotid artery.  Patient verbalized understanding.  Patient denied any further questions or concerns,

## 2022-01-06 ENCOUNTER — Telehealth: Payer: Self-pay | Admitting: Neurology

## 2022-01-06 NOTE — Telephone Encounter (Signed)
I have attempted the PA for Rystiggo. I will call company back once I know the result of the PA.   (Key: [id number])  Your information has been submitted to [employer]. To check for an updated outcome later, reopen this PA request from your dashboard.  If [employer] has not responded to your request within 24 hours, contact [employer] at [phone]. If you think there may be a problem with your PA request, use our live chat feature at the bottom right.

## 2022-01-06 NOTE — Telephone Encounter (Signed)
Jenny Reichmann @ [facility]  is asking for a call back from Cristopher Peru, RN re: Rystiggo for pt

## 2022-01-07 ENCOUNTER — Encounter: Payer: Self-pay | Admitting: Nurse Practitioner

## 2022-01-07 ENCOUNTER — Ambulatory Visit: Payer: 59 | Admitting: Nurse Practitioner

## 2022-01-07 VITALS — BP 130/75 | HR 81 | Ht 68.0 in | Wt 235.0 lb

## 2022-01-07 DIAGNOSIS — I10 Essential (primary) hypertension: Secondary | ICD-10-CM

## 2022-01-07 DIAGNOSIS — Z794 Long term (current) use of insulin: Secondary | ICD-10-CM | POA: Diagnosis not present

## 2022-01-07 DIAGNOSIS — E782 Mixed hyperlipidemia: Secondary | ICD-10-CM

## 2022-01-07 DIAGNOSIS — E1165 Type 2 diabetes mellitus with hyperglycemia: Secondary | ICD-10-CM

## 2022-01-07 MED ORDER — INSULIN GLARGINE (1 UNIT DIAL) 300 UNIT/ML ~~LOC~~ SOPN: 20.0000 [IU] | PEN_INJECTOR | Freq: Every day | SUBCUTANEOUS | 3 refills | Status: DC

## 2022-01-07 MED ORDER — NOVOLOG FLEXPEN 100 UNIT/ML ~~LOC~~ SOPN: 5.0000 [IU] | PEN_INJECTOR | Freq: Three times a day (TID) | SUBCUTANEOUS | 3 refills | Status: DC

## 2022-01-07 MED ORDER — FREESTYLE LIBRE 2 SENSOR MISC: 3 refills | Status: DC

## 2022-01-07 NOTE — Progress Notes (Signed)
Endocrinology Follow Up Note       01/07/2022, 10:51 AM   Subjective:    Patient ID: Tony Combs, male    DOB: 1960-09-09.  Tony Combs is being seen in follow up after being seen in consultation for management of currently uncontrolled symptomatic diabetes requested by  Gilmore Laroche, FNP.   Past Medical History:  Diagnosis Date   CAD in native artery 03/11/2021   Small Non-dominant RCA - prox 90% (CTA FFR 0.66) - Not favorable for PCI - small caliber, non-dominant (<2 mm) vessel.  Mininal diffuse disease in LCx.   Diabetes mellitus without complication (HCC)    Dilated cardiomyopathy (HCC) 01/17/2021   In setting of stroke: TTE 08/31/2020: EF 30 and 35%.  Mildly decreased function.  Moderate LVH.  Unable to assess diastolic function.  Unable assess RV function.  Mild LA dilation.   Hyperlipidemia    Hypertension    Myasthenia gravis (HCC) 03/01/2021   Followed by Dr. 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    Past Surgical History:  Procedure Laterality Date   Coronary CT Angiogram  01/31/2021   Coronary calcium score 40.  No CAD RADS 3.  Moderate stenosis.  RCA has moderate obstructive plaque in the mid and distal vessel.  Otherwise 25-49% OM1. => CT FFR positive, 0.66 in the RCA.   HERNIA REPAIR     LEFT HEART CATH AND CORONARY ANGIOGRAPHY N/A 03/11/2021   Procedure: LEFT HEART CATH AND CORONARY ANGIOGRAPHY;  Surgeon: [name], MD;  Location: [facility];;   Prox RCA lesion is 90% stenosed.-Small to moderate caliber nondominant vessel (<2 mm) => not PCI amenable.  Not the cause of cardiomyopathy.   Otherwise minimal diffuse CAD and a Left Dominant System.   LV EDP is normal with Systemic Hypertension (Central Aop ~20 mmHg > BP Cuff)   TRANSTHORACIC ECHOCARDIOGRAM  08/31/2020   a) EF 30 and 35%.  Mildly  decreased function.  Moderate LVH.  Unable to assess diastolic function.  Unable assess RV function.  Mild LA dilation.; b) 12/21/2020: EF remains 30 to 35% with moderately reduced function.  Global HK.  GR 1 DD.  Normal RV size and function.  Normal aortic valves.  Cannot exclude small PFO. => 02/07/21 -> NEGATIVE BUBBLE STUDY.    Social History   Socioeconomic History   Marital status: Married    Spouse name: Not on file   Number of children: Not on file   Years of education: Not on file   Highest education level: Not on file  Occupational History   Not on file  Tobacco Use   Smoking status: Never   Smokeless tobacco: Never  Vaping Use   Vaping Use: Never used  Substance and Sexual Activity   Alcohol use: Not Currently   Drug use: Not Currently   Sexual activity: Not Currently  Other Topics Concern   Not on file  Social History Narrative   Not on file   Social Determinants of Health   Financial Resource Strain: Not on file  Food Insecurity: Not on file  Transportation  Needs: Not on file  Physical Activity: Not on file  Stress: Not on file  Social Connections: Not on file    Family History  Problem Relation Age of Onset   Hypertension Mother    Diabetes Mother    Heart failure Father [id number]   Heart attack Father [id number]   Hypertension Father    Diabetes type II Father    Coronary artery disease Father [id number]   Congestive Heart Failure Father    Diabetes Brother     Outpatient Encounter Medications as of 01/07/2022  Medication Sig   aspirin EC 81 MG tablet Take 81 mg by mouth in the morning. Swallow whole.   atorvastatin (LIPITOR) 80 MG tablet Take 1 tablet (80 mg total) by mouth daily.   B-D UF III MINI PEN NEEDLES 31G X 5 MM MISC SMARTSIG:1 Pre-Filled Pen Syringe SUB-Q 4 Times Daily   Continuous Blood Gluc Sensor (FREESTYLE LIBRE 2 SENSOR) MISC Use to monitor glucose continuously   Insulin Pen Needle (PEN NEEDLES) 30G X 5 MM MISC 1 pen by Does not apply route 4 (four)  times daily - after meals and at bedtime.   sacubitril-valsartan (ENTRESTO) 49-51 MG Take 1 tablet by mouth 2 (two) times daily.   spironolactone (ALDACTONE) 25 MG tablet Take 0.5 tablets (12.5 mg total) by mouth daily.   [DISCONTINUED] Continuous Blood Gluc Sensor (FREESTYLE LIBRE 3 SENSOR) MISC Inject 1 applicator into the skin every 14 (fourteen) days.   [DISCONTINUED] insulin glargine, 1 Unit Dial, (TOUJEO) 300 UNIT/ML Solostar Pen Inject 20 Units into the skin at bedtime.   [DISCONTINUED] NOVOLOG FLEXPEN 100 UNIT/ML FlexPen Inject 4-7 Units into the skin 3 (three) times daily with meals. (Patient taking differently: Inject 5-10 Units into the skin 3 (three) times daily with meals.)   carvedilol (COREG) 6.25 MG tablet Take 2 tablets (12.5 mg total) by mouth 2 (two) times daily with a meal.   insulin glargine, 1 Unit Dial, (TOUJEO) 300 UNIT/ML Solostar Pen Inject 20 Units into the skin at bedtime.   NOVOLOG FLEXPEN 100 UNIT/ML FlexPen Inject 5-11 Units into the skin 3 (three) times daily with meals.   Facility-Administered Encounter Medications as of 01/07/2022  Medication   sodium chloride flush (NS) 0.9 % injection 10 mL    ALLERGIES: No Known Allergies  VACCINATION STATUS: Immunization History  Administered Date(s) Administered   Tdap 11/28/2021   Zoster Recombinat (Shingrix) 11/28/2021    Diabetes He presents for his follow-up diabetic visit. He has type 2 diabetes mellitus. Onset time: Diagnosed at approx age of 61. His disease course has been worsening. There are no hypoglycemic associated symptoms. Pertinent negatives for diabetes include no fatigue. There are no hypoglycemic complications. Symptoms are stable. Diabetic complications include a CVA and heart disease. Risk factors for coronary artery disease include diabetes mellitus, dyslipidemia, family history, male sex, obesity, hypertension and sedentary lifestyle. Current diabetic treatment includes intensive insulin program.  He is compliant with treatment most of the time. His weight is stable. He is following a generally healthy diet. When asked about meal planning, he reported none. He has not had a previous visit with a dietitian. He rarely participates in exercise. His home blood glucose trend is fluctuating minimally. (He presents today with his CGM, no logs, showing no information since November 4th, when he ran out of sensors.  He has had long absence from practice and we would not refill his sensors until he came in for an OV.  He does report occasional fasting  hypoglycemia in the 70s, but says it is likely due to his meal timing.  Analysis of his CGM shows TIR 61%, TAR 39%, TBR 0% with a GMI of 7.3%.) An ACE inhibitor/angiotensin II receptor blocker is being taken. He does not see a podiatrist.Eye exam is current.  Hypertension This is a chronic problem. The current episode started more than 1 year ago. The problem has been resolved since onset. The problem is controlled. There are no associated agents to hypertension. Risk factors for coronary artery disease include diabetes mellitus, dyslipidemia, family history, obesity, male gender and sedentary lifestyle. Past treatments include beta blockers, diuretics and angiotensin blockers. The current treatment provides significant improvement. There are no compliance problems.  Hypertensive end-organ damage includes CAD/MI and CVA.  Hyperlipidemia This is a chronic problem. The current episode started more than 1 year ago. The problem is controlled. Recent lipid tests were reviewed and are normal. Exacerbating diseases include diabetes and obesity. Factors aggravating his hyperlipidemia include beta blockers and fatty foods. Current antihyperlipidemic treatment includes statins. The current treatment provides moderate improvement of lipids. Compliance problems include adherence to diet and adherence to exercise.  Risk factors for coronary artery disease include diabetes  mellitus, dyslipidemia, family history, hypertension, male sex, obesity and a sedentary lifestyle.     Review of systems  Constitutional: + Minimally fluctuating body weight, current Body mass index is 35.73 kg/m., + fatigue, no subjective hyperthermia, no subjective hypothermia Eyes: no blurry vision, no xerophthalmia ENT: no sore throat, no nodules palpated in throat, + dysphagia from previous stroke, no hoarseness Cardiovascular: no chest pain, no shortness of breath, no palpitations, no leg swelling Respiratory: no cough, no shortness of breath Gastrointestinal: no nausea/vomiting/diarrhea Musculoskeletal: no muscle/joint aches Skin: no rashes, no hyperemia Neurological: no tremors, no numbness, no tingling, no dizziness Psychiatric: no depression, no anxiety  Objective:     BP 130/75 (BP Location: Right Arm, Patient Position: Sitting, Cuff Size: Large)   Pulse 81   Ht 5\' 8"  (1.727 m)   Wt 235 lb (106.6 kg)   BMI 35.73 kg/m   Wt Readings from Last 3 Encounters:  01/07/22 235 lb (106.6 kg)  12/17/21 232 lb (105.2 kg)  11/28/21 232 lb (105.2 kg)     BP Readings from Last 3 Encounters:  01/07/22 130/75  12/17/21 (!) 153/87  11/28/21 134/82      Physical Exam- Limited  Constitutional:  Body mass index is 35.73 kg/m. , not in acute distress, normal state of mind Eyes:  EOMI, no exophthalmos Neck: Supple Cardiovascular: RRR, no murmurs, rubs, or gallops, no edema Respiratory: Adequate breathing efforts, no crackles, rales, rhonchi, or wheezing Musculoskeletal: no gross deformities, strength intact in all four extremities, no gross restriction of joint movements Skin:  no rashes, no hyperemia Neurological: no tremor with outstretched hands    Diabetic Foot Exam - Simple   No data filed       CMP ( most recent) CMP     Component Value Date/Time   NA 140 11/08/2021 0822   K 4.8 11/08/2021 0822   CL 102 11/08/2021 0822   CO2 25 11/08/2021 0822   GLUCOSE  157 (H) 11/08/2021 0822   GLUCOSE 161 (H) 09/02/2020 0451   BUN 13 11/08/2021 0822   CREATININE 0.82 11/08/2021 0822   CALCIUM 9.0 11/08/2021 0822   PROT 6.7 11/08/2021 0822   ALBUMIN 4.0 11/08/2021 0822   AST 16 11/08/2021 0822   ALT 23 11/08/2021 0822   ALKPHOS 86 11/08/2021 0822  BILITOT 0.6 11/08/2021 0822   GFRNONAA 95 09/23/2020 0000   GFRNONAA >60 09/02/2020 0451     Diabetic Labs (most recent): Lab Results  Component Value Date   HGBA1C 8.9 (H) 11/08/2021   HGBA1C 7.1 (A) 02/19/2021   HGBA1C 10.5 09/22/2020   MICROALBUR 10 11/15/2020     Lipid Panel ( most recent) Lipid Panel     Component Value Date/Time   CHOL 122 11/08/2021 0822   TRIG 170 (H) 11/08/2021 0822   HDL 34 (L) 11/08/2021 0822   CHOLHDL 3.6 11/08/2021 0822   CHOLHDL 3.7 03/11/2021 0756   VLDL 21 03/11/2021 0756   LDLCALC 59 11/08/2021 0822   LABVLDL 29 11/08/2021 0822      Lab Results  Component Value Date   TSH 1.290 11/08/2021   TSH 0.787 09/01/2020   FREET4 1.20 11/08/2021           Assessment & Plan:   1) Type 2 diabetes mellitus with hyperglycemia, with long-term current use of insulin (HCC)  He presents today with his CGM, no logs, showing no information since November 4th, when he ran out of sensors.  He has had long absence from practice and we would not refill his sensors until he came in for an OV.  He does report occasional fasting hypoglycemia in the 70s, but says it is likely due to his meal timing.  Analysis of his CGM shows TIR 61%, TAR 39%, TBR 0% with a GMI of 7.3%.  - Tony Combs has currently uncontrolled symptomatic type 2 DM since 61 years of age.  -Recent labs reviewed.  - I had a long discussion with him about the progressive nature of diabetes and the pathology behind its complications. -his diabetes is complicated by CVA, CAD and he remains at a high risk for more acute and chronic complications which include CAD, CVA, CKD, retinopathy, and neuropathy.  These are all discussed in detail with him.  - Nutritional counseling repeated at each appointment due to patients tendency to fall back in to old habits.  - The patient admits there is a room for improvement in their diet and drink choices. -  Suggestion is made for the patient to avoid simple carbohydrates from their diet including Cakes, Sweet Desserts / Pastries, Ice Cream, Soda (diet and regular), Sweet Tea, Candies, Chips, Cookies, Sweet Pastries, Store Bought Juices, Alcohol in Excess of 1-2 drinks a day, Artificial Sweeteners, Coffee Creamer, and "Sugar-free" Products. This will help patient to have stable blood glucose profile and potentially avoid unintended weight gain.   - I encouraged the patient to switch to unprocessed or minimally processed complex starch and increased protein intake (animal or plant source), fruits, and vegetables.   - Patient is advised to stick to a routine mealtimes to eat 3 meals a day and avoid unnecessary snacks (to snack only to correct hypoglycemia).  - he will be scheduled with Norm Salt, RDN, CDE for diabetes education.    - I have approached him with the following individualized plan to manage his diabetes and patient agrees:   -Given the lack of recent logs to review, no changes will be made to his medications today.  He is advised to continue his Toujeo 20 units SQ nightly and Novolog 5-11 units TID with meals if glucose is above 90 and he is eating (Specific instructions on how to titrate insulin dosage based on glucose readings given to patient in writing).     -he is encouraged to continue  monitoring glucose 4 times daily (using his CGM), before meals and before bed, and to call the clinic if he has readings less than 70 or above 300 for 3 tests in a row.  I did send in refill for his Josephine Igo 2 today.  - he is warned not to take insulin without proper monitoring per orders. - Adjustment parameters are given to him for hypo and hyperglycemia in  writing.  - Specific targets for  A1c; LDL, HDL, and Triglycerides were discussed with the patient.  2) Blood Pressure /Hypertension:  his blood pressure is controlled to target.   he is advised to continue his current medications including Coreg 6.25 mg p.o. twice daily, Entresto 49-51 mg po daily, and Spironolactone 25 mg po daily.  3) Lipids/Hyperlipidemia:    Review of his recent lipid panel from 11/08/21 showed controlled LDL at 59 and elevated triglycerides of 170 (improving) .  he is advised to continue Lipitor 80 mg daily at bedtime.  Side effects and precautions discussed with him.  4)  Weight/Diet:  his Body mass index is 35.73 kg/m.  -  clearly complicating his diabetes care.   he is a candidate for weight loss. I discussed with him the fact that loss of 5 - 10% of his  current body weight will have the most impact on his diabetes management.  Exercise, and detailed carbohydrates information provided  -  detailed on discharge instructions.  5) Chronic Care/Health Maintenance: -he is on ACEI/ARB and Statin medications and is encouraged to initiate and continue to follow up with Ophthalmology, Dentist, Podiatrist at least yearly or according to recommendations, and advised to stay away from smoking. I have recommended yearly flu vaccine and pneumonia vaccine at least every 5 years; moderate intensity exercise for up to 150 minutes weekly; and sleep for at least 7 hours a day.  - he is advised to maintain close follow up with Gilmore Laroche, FNP for primary care needs, as well as his other providers for optimal and coordinated care.     I spent 35 minutes in the care of the patient today including review of labs from CMP, Lipids, Thyroid Function, Hematology (current and previous including abstractions from other facilities); face-to-face time discussing  his blood glucose readings/logs, discussing hypoglycemia and hyperglycemia episodes and symptoms, medications doses, his options of  short and long term treatment based on the latest standards of care / guidelines;  discussion about incorporating lifestyle medicine;  and documenting the encounter. Risk reduction counseling performed per USPSTF guidelines to reduce obesity and cardiovascular risk factors.     Please refer to Patient Instructions for Blood Glucose Monitoring and Insulin/Medications Dosing Guide"  in media tab for additional information. Please  also refer to " Patient Self Inventory" in the Media  tab for reviewed elements of pertinent patient history.  Tony Combs participated in the discussions, expressed understanding, and voiced agreement with the above plans.  All questions were answered to his satisfaction. he is encouraged to contact clinic should he have any questions or concerns prior to his return visit.     Follow up plan: - Return in about 4 months (around 05/08/2022) for Diabetes F/U with A1c in office, No previsit labs, Bring meter and logs.   Ronny Bacon, [facility] [address] [place], [place] [address] Phone: [phone] Fax: [phone]  01/07/2022, 10:51 AM

## 2022-01-20 NOTE — Telephone Encounter (Signed)
Received feedback from pt's insurance PA not approved this drug is excluded from the pt's policy.  I have reached out to [facility] the drug rep for this medication. She sts the intake team is working through this request still. In summary, the pt's pharmacy benefit denied coverage but there is a possibility he could have coverage throgh his medical benefit.  Jenneifer sts she will have Tammy Sours call me back to further discuss this.

## 2022-02-03 DIAGNOSIS — E559 Vitamin D deficiency, unspecified: Secondary | ICD-10-CM | POA: Diagnosis not present

## 2022-02-03 DIAGNOSIS — E0800 Diabetes mellitus due to underlying condition with hyperosmolarity without nonketotic hyperglycemic-hyperosmolar coma (NKHHC): Secondary | ICD-10-CM | POA: Diagnosis not present

## 2022-02-03 DIAGNOSIS — Z794 Long term (current) use of insulin: Secondary | ICD-10-CM | POA: Diagnosis not present

## 2022-02-03 DIAGNOSIS — I10 Essential (primary) hypertension: Secondary | ICD-10-CM | POA: Diagnosis not present

## 2022-02-03 DIAGNOSIS — E7849 Other hyperlipidemia: Secondary | ICD-10-CM | POA: Diagnosis not present

## 2022-02-03 DIAGNOSIS — E038 Other specified hypothyroidism: Secondary | ICD-10-CM | POA: Diagnosis not present

## 2022-02-04 LAB — CMP14+EGFR
ALT: 33 IU/L (ref 0–44)
AST: 22 IU/L (ref 0–40)
Albumin/Globulin Ratio: 1.6 (ref 1.2–2.2)
Albumin: 4.1 g/dL (ref 3.9–4.9)
Alkaline Phosphatase: 94 IU/L (ref 44–121)
BUN/Creatinine Ratio: 12 (ref 10–24)
BUN: 10 mg/dL (ref 8–27)
Bilirubin Total: 0.5 mg/dL (ref 0.0–1.2)
CO2: 26 mmol/L (ref 20–29)
Calcium: 8.9 mg/dL (ref 8.6–10.2)
Chloride: 99 mmol/L (ref 96–106)
Creatinine, Ser: 0.82 mg/dL (ref 0.76–1.27)
Globulin, Total: 2.6 g/dL (ref 1.5–4.5)
Glucose: 132 mg/dL — ABNORMAL HIGH (ref 70–99)
Potassium: 4.2 mmol/L (ref 3.5–5.2)
Sodium: 139 mmol/L (ref 134–144)
Total Protein: 6.7 g/dL (ref 6.0–8.5)
eGFR: 99 mL/min/{1.73_m2} (ref >59)

## 2022-02-04 LAB — VITAMIN D 25 HYDROXY (VIT D DEFICIENCY, FRACTURES): Vit D, 25-Hydroxy: 29.1 ng/mL — ABNORMAL LOW (ref 30.0–100.0)

## 2022-02-04 LAB — LIPID PANEL
Chol/HDL Ratio: 3.6 ratio (ref 0.0–5.0)
Cholesterol, Total: 124 mg/dL (ref 100–199)
HDL: 34 mg/dL — ABNORMAL LOW (ref >39)
LDL Chol Calc (NIH): 65 mg/dL (ref 0–99)
Triglycerides: 144 mg/dL (ref 0–149)
VLDL Cholesterol Cal: 25 mg/dL (ref 5–40)

## 2022-02-04 LAB — CBC WITH DIFFERENTIAL/PLATELET
Basophils Absolute: 0 10*3/uL (ref 0.0–0.2)
Basos: 1 %
EOS (ABSOLUTE): 0.3 10*3/uL (ref 0.0–0.4)
Eos: 4 %
Hematocrit: 38.1 % (ref 37.5–51.0)
Hemoglobin: 12.7 g/dL — ABNORMAL LOW (ref 13.0–17.7)
Immature Grans (Abs): 0 10*3/uL (ref 0.0–0.1)
Immature Granulocytes: 0 %
Lymphocytes Absolute: 2 10*3/uL (ref 0.7–3.1)
Lymphs: 31 %
MCH: 28.5 pg (ref 26.6–33.0)
MCHC: 33.3 g/dL (ref 31.5–35.7)
MCV: 85 fL (ref 79–97)
Monocytes Absolute: 0.5 10*3/uL (ref 0.1–0.9)
Monocytes: 7 %
Neutrophils Absolute: 3.7 10*3/uL (ref 1.4–7.0)
Neutrophils: 57 %
Platelets: 192 10*3/uL (ref 150–450)
RBC: 4.46 x10E6/uL (ref 4.14–5.80)
RDW: 13.6 % (ref 11.6–15.4)
WBC: 6.4 10*3/uL (ref 3.4–10.8)

## 2022-02-04 LAB — HEMOGLOBIN A1C
Est. average glucose Bld gHb Est-mCnc: 192 mg/dL
Hgb A1c MFr Bld: 8.3 % — ABNORMAL HIGH (ref 4.8–5.6)

## 2022-02-04 LAB — TSH+FREE T4
Free T4: 1.13 ng/dL (ref 0.82–1.77)
TSH: 1.92 u[IU]/mL (ref 0.450–4.500)

## 2022-02-06 ENCOUNTER — Encounter: Payer: Self-pay | Admitting: Family Medicine

## 2022-02-06 ENCOUNTER — Ambulatory Visit (INDEPENDENT_AMBULATORY_CARE_PROVIDER_SITE_OTHER): Payer: Self-pay | Admitting: Family Medicine

## 2022-02-06 VITALS — BP 137/78 | HR 75 | Ht 68.0 in | Wt 235.0 lb

## 2022-02-06 DIAGNOSIS — E785 Hyperlipidemia, unspecified: Secondary | ICD-10-CM

## 2022-02-06 DIAGNOSIS — I10 Essential (primary) hypertension: Secondary | ICD-10-CM

## 2022-02-06 DIAGNOSIS — E1169 Type 2 diabetes mellitus with other specified complication: Secondary | ICD-10-CM

## 2022-02-06 DIAGNOSIS — E1100 Type 2 diabetes mellitus with hyperosmolarity without nonketotic hyperglycemic-hyperosmolar coma (NKHHC): Secondary | ICD-10-CM

## 2022-02-06 DIAGNOSIS — Z23 Encounter for immunization: Secondary | ICD-10-CM

## 2022-02-06 NOTE — Progress Notes (Signed)
Established Patient Office Visit  Subjective:  Patient ID: Tony Combs, male    DOB: 03-Jul-1960  Age: 61 y.o. MRN: [id number]  CC:  Chief Complaint  Patient presents with   Diabetes    Patient is here for 2 month f/u, has no issues or side effects.    Hypertension    No issues or side effects.    Hyperlipidemia    No issues or side effects.     HPI Tony Combs is a 61 y.o. male with past medical history of type 2 diabetes, hyperlipidemia, and hypertension presents for f/u of  chronic medical conditions. For the details of today's visit, please refer to the assessment and plan.     Past Medical History:  Diagnosis Date   CAD in native artery 03/11/2021   Small Non-dominant RCA - prox 90% (CTA FFR 0.66) - Not favorable for PCI - small caliber, non-dominant (<2 mm) vessel.  Mininal diffuse disease in LCx.   Diabetes mellitus without complication ([place])    Dilated cardiomyopathy ([place]) 01/17/2021   In setting of stroke: TTE 08/31/2020: EF 30 and 35%.  Mildly decreased function.  Moderate LVH.  Unable to assess diastolic function.  Unable assess RV function.  Mild LA dilation.   Hyperlipidemia    Hypertension    Myasthenia gravis ([place]) 03/01/2021   Followed by Dr. 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    Past Surgical History:  Procedure Laterality Date   Coronary CT Angiogram  01/31/2021   Coronary calcium score 40.  No CAD RADS 3.  Moderate stenosis.  RCA has moderate obstructive plaque in the mid and distal vessel.  Otherwise 25-49% OM1. => CT FFR positive, 0.66 in the RCA.   HERNIA REPAIR     LEFT HEART CATH AND CORONARY ANGIOGRAPHY N/A 03/11/2021   Procedure: LEFT HEART CATH AND CORONARY ANGIOGRAPHY;  Surgeon: Leonie Man, MD;  Location: [facility];;   Prox RCA lesion is 90% stenosed.-Small to moderate caliber nondominant vessel (<2 mm) => not PCI amenable.  Not the cause of  cardiomyopathy.   Otherwise minimal diffuse CAD and a Left Dominant System.   LV EDP is normal with Systemic Hypertension (Central Aop ~20 mmHg > BP Cuff)   TRANSTHORACIC ECHOCARDIOGRAM  08/31/2020   a) EF 30 and 35%.  Mildly decreased function.  Moderate LVH.  Unable to assess diastolic function.  Unable assess RV function.  Mild LA dilation.; b) 12/21/2020: EF remains 30 to 35% with moderately reduced function.  Global HK.  GR 1 DD.  Normal RV size and function.  Normal aortic valves.  Cannot exclude small PFO. => 02/07/21 -> NEGATIVE BUBBLE STUDY.    Family History  Problem Relation Age of Onset   Hypertension Mother    Diabetes Mother    Heart failure Father 38   Heart attack Father 3   Hypertension Father    Diabetes type II Father    Coronary artery disease Father 46   Congestive Heart Failure Father    Diabetes Brother     Social History   Socioeconomic History   Marital status: Married    Spouse name: Not on file   Number of children: Not on file   Years of education: Not on file   Highest education level: Not on file  Occupational History   Not on file  Tobacco Use   Smoking status: Never  Smokeless tobacco: Never  Vaping Use   Vaping Use: Never used  Substance and Sexual Activity   Alcohol use: Not Currently   Drug use: Not Currently   Sexual activity: Not Currently  Other Topics Concern   Not on file  Social History Narrative   Not on file   Social Determinants of Health   Financial Resource Strain: Not on file  Food Insecurity: Not on file  Transportation Needs: Not on file  Physical Activity: Not on file  Stress: Not on file  Social Connections: Not on file  Intimate Partner Violence: Not on file    Outpatient Medications Prior to Visit  Medication Sig Dispense Refill   aspirin EC 81 MG tablet Take 81 mg by mouth in the morning. Swallow whole.     atorvastatin (LIPITOR) 80 MG tablet Take 1 tablet (80 mg total) by mouth daily. 30 tablet 0   B-D UF  III MINI PEN NEEDLES 31G X 5 MM MISC SMARTSIG:1 Pre-Filled Pen Syringe SUB-Q 4 Times Daily     Continuous Blood Gluc Sensor (FREESTYLE LIBRE 2 SENSOR) MISC Use to monitor glucose continuously 6 each 3   insulin glargine, 1 Unit Dial, (TOUJEO) 300 UNIT/ML Solostar Pen Inject 20 Units into the skin at bedtime. 18 mL 3   Insulin Pen Needle (PEN NEEDLES) 30G X 5 MM MISC 1 pen by Does not apply route 4 (four) times daily - after meals and at bedtime. 200 each 1   NOVOLOG FLEXPEN 100 UNIT/ML FlexPen Inject 5-11 Units into the skin 3 (three) times daily with meals. 15 mL 3   sacubitril-valsartan (ENTRESTO) 49-51 MG Take 1 tablet by mouth 2 (two) times daily. 180 tablet 3   carvedilol (COREG) 6.25 MG tablet Take 2 tablets (12.5 mg total) by mouth 2 (two) times daily with a meal. 120 tablet 0   spironolactone (ALDACTONE) 25 MG tablet Take 0.5 tablets (12.5 mg total) by mouth daily. 45 tablet 3   Facility-Administered Medications Prior to Visit  Medication Dose Route Frequency Provider Last Rate Last Admin   sodium chloride flush (NS) 0.9 % injection 10 mL  10 mL Intravenous PRN Leonie Man, MD   20 mL at 02/07/21 0925    No Known Allergies  ROS Review of Systems  Constitutional:  Negative for fatigue and fever.  Eyes:  Negative for visual disturbance.  Respiratory:  Negative for chest tightness and shortness of breath.   Cardiovascular:  Negative for chest pain and palpitations.  Neurological:  Negative for dizziness and headaches.      Objective:    Physical Exam HENT:     Head: Normocephalic.     Right Ear: External ear normal.     Left Ear: External ear normal.     Nose: No congestion or rhinorrhea.     Mouth/Throat:     Mouth: Mucous membranes are moist.  Cardiovascular:     Rate and Rhythm: Regular rhythm.     Heart sounds: No murmur heard. Pulmonary:     Effort: No respiratory distress.     Breath sounds: Normal breath sounds.  Neurological:     Mental Status: He is  alert.     BP 137/78   Pulse 75   Ht _0  (1.727 m)   Wt 235 lb (106.6 kg)   SpO2 96%   BMI 35.73 kg/m  Wt Readings from Last 3 Encounters:  02/06/22 235 lb (106.6 kg)  01/07/22 235 lb (106.6 kg)  12/17/21 232 lb (  105.2 kg)    Lab Results  Component Value Date   TSH 1.920 02/03/2022   Lab Results  Component Value Date   WBC 6.4 02/03/2022   HGB 12.7 (L) 02/03/2022   HCT 38.1 02/03/2022   MCV 85 02/03/2022   PLT 192 02/03/2022   Lab Results  Component Value Date   NA 139 02/03/2022   K 4.2 02/03/2022   CO2 26 02/03/2022   GLUCOSE 132 (H) 02/03/2022   BUN 10 02/03/2022   CREATININE 0.82 02/03/2022   BILITOT 0.5 02/03/2022   ALKPHOS 94 02/03/2022   AST 22 02/03/2022   ALT 33 02/03/2022   PROT 6.7 02/03/2022   ALBUMIN 4.1 02/03/2022   CALCIUM 8.9 02/03/2022   ANIONGAP 5 09/02/2020   EGFR 99 02/03/2022   Lab Results  Component Value Date   CHOL 124 02/03/2022   Lab Results  Component Value Date   HDL 34 (L) 02/03/2022   Lab Results  Component Value Date   LDLCALC 65 02/03/2022   Lab Results  Component Value Date   TRIG 144 02/03/2022   Lab Results  Component Value Date   CHOLHDL 3.6 02/03/2022   Lab Results  Component Value Date   HGBA1C 8.3 (H) 02/03/2022      Assessment & Plan:  Hyperlipidemia associated with type 2 diabetes mellitus ([place]) Assessment & Plan: He takes atorvastatin 80 mg daily LDL is at goal of <70 He denies muscle aches and pain Encouraged to continue treatment regimen Lab Results  Component Value Date   CHOL 124 02/03/2022   HDL 34 (L) 02/03/2022   LDLCALC 65 02/03/2022   TRIG 144 02/03/2022   CHOLHDL 3.6 02/03/2022       Essential hypertension Assessment & Plan: Controlled He takes  carvedilol 12.5 twice daily,Spironolactone 12.5 mg daily and Entresto 49- 51 mg twice daily He reports compliant with treatment regimen Encouraged to continue taking carvedilol 12.5 twice daily,Spironolactone 12.5 mg daily and  Entresto 49- 51 mg twice daily BP Readings from Last 3 Encounters:  02/06/22 137/78  01/07/22 130/75  12/17/21 (!) 153/87       Type 2 diabetes mellitus with hyperosmolarity without coma, without long-term current use of insulin (HCC) Assessment & Plan: He takes NovoLog 5 to 11 units 3 times daily and insulin glargine 20 units at bedtime He reports compliance with treatment regiment His hemoglobin A1c has decreased from 8.9 to 8.3 Encouraged to continue on treatment regimen Lab Results  Component Value Date   HGBA1C 8.3 (H) 02/03/2022       Need for shingles vaccine -     Varicella-zoster vaccine IM    Follow-up: Return in about 3 months (around 05/08/2022).   Alvira Monday, FNP

## 2022-02-06 NOTE — Assessment & Plan Note (Signed)
Controlled He takes  carvedilol 12.5 twice daily,Spironolactone 12.5 mg daily and Entresto 49- 51 mg twice daily He reports compliant with treatment regimen Encouraged to continue taking carvedilol 12.5 twice daily,Spironolactone 12.5 mg daily and Entresto 49- 51 mg twice daily BP Readings from Last 3 Encounters:  02/06/22 137/78  01/07/22 130/75  12/17/21 (!) 153/87

## 2022-02-06 NOTE — Assessment & Plan Note (Addendum)
He takes atorvastatin 80 mg daily LDL is at goal of <70 He denies muscle aches and pain Encouraged to continue treatment regimen Lab Results  Component Value Date   CHOL 124 02/03/2022   HDL 34 (L) 02/03/2022   LDLCALC 65 02/03/2022   TRIG 144 02/03/2022   CHOLHDL 3.6 02/03/2022

## 2022-02-06 NOTE — Assessment & Plan Note (Addendum)
He takes NovoLog 5 to 11 units 3 times daily and insulin glargine 20 units at bedtime He reports compliance with treatment regiment His hemoglobin A1c has decreased from 8.9 to 8.3 Encouraged to continue on treatment regimen Lab Results  Component Value Date   HGBA1C 8.3 (H) 02/03/2022

## 2022-02-06 NOTE — Patient Instructions (Signed)
I appreciate the opportunity to provide care to you today!    Follow up:  3 months  Labs: please stop by the lab 2-3 days before your next appt to get your blood drawn (CBC, CMP, TSH, Lipid profile, HgA1c, Vit D)     Please continue to a heart-healthy diet and increase your physical activities. Try to exercise for at least three times a week.      It was a pleasure to see you and I look forward to continuing to work together on your health and well-being. Please do not hesitate to call the office if you need care or have questions about your care.   Have a wonderful day and week. With Gratitude, Gilmore Laroche MSN, FNP-BC

## 2022-02-21 ENCOUNTER — Other Ambulatory Visit: Payer: Self-pay

## 2022-02-26 ENCOUNTER — Encounter: Payer: Self-pay | Admitting: Family Medicine

## 2022-02-26 ENCOUNTER — Ambulatory Visit (INDEPENDENT_AMBULATORY_CARE_PROVIDER_SITE_OTHER): Payer: 59 | Admitting: Family Medicine

## 2022-02-26 VITALS — BP 132/81 | HR 76 | Ht 68.0 in | Wt 233.0 lb

## 2022-02-26 DIAGNOSIS — I6930 Unspecified sequelae of cerebral infarction: Secondary | ICD-10-CM | POA: Diagnosis not present

## 2022-02-26 DIAGNOSIS — I639 Cerebral infarction, unspecified: Secondary | ICD-10-CM

## 2022-02-26 NOTE — Assessment & Plan Note (Signed)
Patient is on long-term disability He has forms to be completed by PCP Will complete form and fax back in the week

## 2022-02-26 NOTE — Progress Notes (Signed)
Established Patient Office Visit  Subjective:  Patient ID: Tony Combs, male    DOB: Jul 26, 1960  Age: 62 y.o. MRN: [id number]  CC:  Chief Complaint  Patient presents with   Forms    Pt here for completion of disability forms, pt states this is ongoing, is due for renewal of completion, needs it completed from his PCP.     HPI Tony Combs is a 62 y.o. male with past medical history of acute cerebrovascular accident, essential hypertension, pulmonary nodule, and dizziness presents for f/u of  chronic medical conditions. For the details of today's visit, please refer to the assessment and plan.     Past Medical History:  Diagnosis Date   CAD in native artery 03/11/2021   Small Non-dominant RCA - prox 90% (CTA FFR 0.66) - Not favorable for PCI - small caliber, non-dominant (<2 mm) vessel.  Mininal diffuse disease in LCx.   Diabetes mellitus without complication ([place])    Dilated cardiomyopathy ([place]) 01/17/2021   In setting of stroke: TTE 08/31/2020: EF 30 and 35%.  Mildly decreased function.  Moderate LVH.  Unable to assess diastolic function.  Unable assess RV function.  Mild LA dilation.   Hyperlipidemia    Hypertension    Myasthenia gravis ([place]) 03/01/2021   Followed by Dr. 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    Past Surgical History:  Procedure Laterality Date   Coronary CT Angiogram  01/31/2021   Coronary calcium score 40.  No CAD RADS 3.  Moderate stenosis.  RCA has moderate obstructive plaque in the mid and distal vessel.  Otherwise 25-49% OM1. => CT FFR positive, 0.66 in the RCA.   HERNIA REPAIR     LEFT HEART CATH AND CORONARY ANGIOGRAPHY N/A 03/11/2021   Procedure: LEFT HEART CATH AND CORONARY ANGIOGRAPHY;  Surgeon: Leonie Man, MD;  Location: [facility];;   Prox RCA lesion is 90% stenosed.-Small to moderate caliber nondominant vessel (<2 mm) => not PCI amenable.  Not the  cause of cardiomyopathy.   Otherwise minimal diffuse CAD and a Left Dominant System.   LV EDP is normal with Systemic Hypertension (Central Aop ~20 mmHg > BP Cuff)   TRANSTHORACIC ECHOCARDIOGRAM  08/31/2020   a) EF 30 and 35%.  Mildly decreased function.  Moderate LVH.  Unable to assess diastolic function.  Unable assess RV function.  Mild LA dilation.; b) 12/21/2020: EF remains 30 to 35% with moderately reduced function.  Global HK.  GR 1 DD.  Normal RV size and function.  Normal aortic valves.  Cannot exclude small PFO. => 02/07/21 -> NEGATIVE BUBBLE STUDY.    Family History  Problem Relation Age of Onset   Hypertension Mother    Diabetes Mother    Heart failure Father 67   Heart attack Father 8   Hypertension Father    Diabetes type II Father    Coronary artery disease Father 52   Congestive Heart Failure Father    Diabetes Brother     Social History   Socioeconomic History   Marital status: Married    Spouse name: Not on file   Number of children: Not on file   Years of education: Not on file   Highest education level: Not on file  Occupational History   Not on file  Tobacco Use   Smoking status: Never   Smokeless tobacco: Never  Vaping Use  Vaping Use: Never used  Substance and Sexual Activity   Alcohol use: Not Currently   Drug use: Not Currently   Sexual activity: Not Currently  Other Topics Concern   Not on file  Social History Narrative   Not on file   Social Determinants of Health   Financial Resource Strain: Not on file  Food Insecurity: Not on file  Transportation Needs: Not on file  Physical Activity: Not on file  Stress: Not on file  Social Connections: Not on file  Intimate Partner Violence: Not on file    Outpatient Medications Prior to Visit  Medication Sig Dispense Refill   aspirin EC 81 MG tablet Take 81 mg by mouth in the morning. Swallow whole.     atorvastatin (LIPITOR) 80 MG tablet Take 1 tablet (80 mg total) by mouth daily. 30 tablet 0    B-D UF III MINI PEN NEEDLES 31G X 5 MM MISC SMARTSIG:1 Pre-Filled Pen Syringe SUB-Q 4 Times Daily     Continuous Blood Gluc Sensor (FREESTYLE LIBRE 2 SENSOR) MISC Use to monitor glucose continuously 6 each 3   insulin glargine, 1 Unit Dial, (TOUJEO) 300 UNIT/ML Solostar Pen Inject 20 Units into the skin at bedtime. 18 mL 3   Insulin Pen Needle (PEN NEEDLES) 30G X 5 MM MISC 1 pen by Does not apply route 4 (four) times daily - after meals and at bedtime. 200 each 1   NOVOLOG FLEXPEN 100 UNIT/ML FlexPen Inject 5-11 Units into the skin 3 (three) times daily with meals. 15 mL 3   sacubitril-valsartan (ENTRESTO) 49-51 MG Take 1 tablet by mouth 2 (two) times daily. 180 tablet 3   carvedilol (COREG) 6.25 MG tablet Take 2 tablets (12.5 mg total) by mouth 2 (two) times daily with a meal. 120 tablet 0   spironolactone (ALDACTONE) 25 MG tablet Take 0.5 tablets (12.5 mg total) by mouth daily. 45 tablet 3   Facility-Administered Medications Prior to Visit  Medication Dose Route Frequency Provider Last Rate Last Admin   sodium chloride flush (NS) 0.9 % injection 10 mL  10 mL Intravenous PRN Leonie Man, MD   20 mL at 02/07/21 0925    No Known Allergies  ROS Review of Systems  Constitutional:  Negative for fatigue and fever.  Eyes:  Negative for visual disturbance.  Respiratory:  Negative for chest tightness and shortness of breath.   Cardiovascular:  Negative for chest pain and palpitations.  Neurological:  Negative for dizziness and headaches.      Objective:    Physical Exam HENT:     Head: Normocephalic.     Right Ear: External ear normal.     Left Ear: External ear normal.     Nose: No congestion or rhinorrhea.     Mouth/Throat:     Mouth: Mucous membranes are moist.  Cardiovascular:     Rate and Rhythm: Regular rhythm.     Heart sounds: No murmur heard. Pulmonary:     Effort: No respiratory distress.     Breath sounds: Normal breath sounds.  Neurological:     Mental Status:  He is alert.     BP 132/81   Pulse 76   Ht 5\' 8"  (1.727 m)   Wt 233 lb (105.7 kg)   SpO2 93%   BMI 35.43 kg/m  Wt Readings from Last 3 Encounters:  02/26/22 233 lb (105.7 kg)  02/06/22 235 lb (106.6 kg)  01/07/22 235 lb (106.6 kg)    Lab Results  Component Value Date   TSH 1.920 02/03/2022   Lab Results  Component Value Date   WBC 6.4 02/03/2022   HGB 12.7 (L) 02/03/2022   HCT 38.1 02/03/2022   MCV 85 02/03/2022   PLT 192 02/03/2022   Lab Results  Component Value Date   NA 139 02/03/2022   K 4.2 02/03/2022   CO2 26 02/03/2022   GLUCOSE 132 (H) 02/03/2022   BUN 10 02/03/2022   CREATININE 0.82 02/03/2022   BILITOT 0.5 02/03/2022   ALKPHOS 94 02/03/2022   AST 22 02/03/2022   ALT 33 02/03/2022   PROT 6.7 02/03/2022   ALBUMIN 4.1 02/03/2022   CALCIUM 8.9 02/03/2022   ANIONGAP 5 09/02/2020   EGFR 99 02/03/2022   Lab Results  Component Value Date   CHOL 124 02/03/2022   Lab Results  Component Value Date   HDL 34 (L) 02/03/2022   Lab Results  Component Value Date   LDLCALC 65 02/03/2022   Lab Results  Component Value Date   TRIG 144 02/03/2022   Lab Results  Component Value Date   CHOLHDL 3.6 02/03/2022   Lab Results  Component Value Date   HGBA1C 8.3 (H) 02/03/2022      Assessment & Plan:  Acute CVA (cerebrovascular accident) [facility]) Assessment & Plan: Patient is on long-term disability He has forms to be completed by PCP Will complete form and fax back in the week      Follow-up: No follow-ups on file.   Gilmore Laroche, FNP

## 2022-02-26 NOTE — Patient Instructions (Addendum)
I appreciate the opportunity to provide care to you today!    Follow up:  05/08/22  Your forms will be completed and faxed by the end of the week   Please continue to a heart-healthy diet and increase your physical activities. Try to exercise for 15mins at least five times a week.      It was a pleasure to see you and I look forward to continuing to work together on your health and well-being. Please do not hesitate to call the office if you need care or have questions about your care.   Have a wonderful day and week. With Gratitude, Alvira Monday MSN, FNP-BC

## 2022-03-02 ENCOUNTER — Other Ambulatory Visit: Payer: Self-pay | Admitting: Family Medicine

## 2022-03-12 NOTE — Telephone Encounter (Signed)
I received call from [facility] with [employer] [facility]. Loss adjuster, chartered are battling for an approval for this drug. Medical PA will be needed from our office at some point but [facility] has to clear the med to be administered and the means to do so.   Marya Amsler will call back with further instructions and when we can submit the PA. Will wait for call back.

## 2022-03-16 DIAGNOSIS — I959 Hypotension, unspecified: Secondary | ICD-10-CM | POA: Diagnosis not present

## 2022-03-16 DIAGNOSIS — R110 Nausea: Secondary | ICD-10-CM | POA: Diagnosis not present

## 2022-03-16 DIAGNOSIS — J329 Chronic sinusitis, unspecified: Secondary | ICD-10-CM | POA: Diagnosis not present

## 2022-03-16 DIAGNOSIS — R5381 Other malaise: Secondary | ICD-10-CM | POA: Diagnosis not present

## 2022-03-16 DIAGNOSIS — R519 Headache, unspecified: Secondary | ICD-10-CM | POA: Diagnosis not present

## 2022-03-16 DIAGNOSIS — M47812 Spondylosis without myelopathy or radiculopathy, cervical region: Secondary | ICD-10-CM | POA: Diagnosis not present

## 2022-03-16 DIAGNOSIS — R531 Weakness: Secondary | ICD-10-CM | POA: Diagnosis not present

## 2022-03-16 DIAGNOSIS — R61 Generalized hyperhidrosis: Secondary | ICD-10-CM | POA: Diagnosis not present

## 2022-03-16 DIAGNOSIS — R55 Syncope and collapse: Secondary | ICD-10-CM | POA: Diagnosis not present

## 2022-03-16 DIAGNOSIS — M50821 Other cervical disc disorders at C4-C5 level: Secondary | ICD-10-CM | POA: Diagnosis not present

## 2022-03-21 ENCOUNTER — Other Ambulatory Visit: Payer: Self-pay

## 2022-03-26 NOTE — Telephone Encounter (Signed)
Resubmitted a Prior Authorization request to [employer] for  Rystiggo  via CoverMyMeds. Will update once we receive a response.  (KeyElwin Mocha) - [id number]

## 2022-03-26 NOTE — Telephone Encounter (Signed)
Edwin from [facility] has called to report it is time to submit the PA for Rystiggo. Phone rep provided him office fax # if there are questions their call back # is [phone] for the team that works this medication go with xt [phone]

## 2022-04-02 NOTE — Telephone Encounter (Signed)
Tony Combs called again wanting to know the update on this PA. He states that the request needs to be sent to her [facility] [id number]

## 2022-04-02 NOTE — Telephone Encounter (Signed)
Per call from pharmacy staff PA still pending.

## 2022-04-08 ENCOUNTER — Other Ambulatory Visit (HOSPITAL_COMMUNITY): Payer: Self-pay

## 2022-04-08 NOTE — Progress Notes (Addendum)
[facility] [address] [place]. [place] [address] [phone]       OFFICE FOLLOW-UP NOTE  Tony Combs Date of Birth:  [birth year] Medical Record Number:  [id number]   HPI:   Last visit 12/06/2020 Tony Combs is a 62 year old Caucasian male seen today for initial office follow-up visit following hospital consultation for stroke in July 2022.  History is obtained from the Tony Combs and his wife and review of electronic medical records and I personally reviewed pertinent imaging films in PACS.  Tony Combs has past medical history of hypertension, hyperlipidemia, diabetes, coronary artery disease who presented to [facility] ED on 08/31/2020 for sudden onset of slurred speech with visual blurring and double vision.  Symptoms began the day prior to admission she went to [facility] where his noncontrast CT scan of the head was done which was unremarkable but before seeing the doctor she left [place].  She presented to [facility] ED the next day with complaints of persistent slurred speech with headache and blurred and double vision.  Tony Combs also noted right side of the face felt weak and tingling.  Tony Combs also felt off balance and dizzy at work but denies any focal extremity weakness.  MRI scan of the brain showed a 12 mm acute infarct in the anterior medial right frontal subcortical white matter.  Remote age tiny chronic left thalamic infarct was also noted.  MR angiogram study of the brain showed multifocal stenoses.with mild-to-moderate stenosis within the distal A2 segment of the right anterior cerebral artery. Severe stenosis within the left posterior cerebral artery at the P1/P2 junction. and MRA of the neck was unremarkable.  2D echo showed diminished ejection fraction of 30 to 35% but no clot.  LDL cholesterol 61 mg percent and hemoglobin A1c was elevated at 11.7.  Tony Combs was felt to be at high risk for sleep apnea and offered participation in the sleep smart study  for stroke prevention and agreed to participate.  Tony Combs was randomized to this CPAP treatment for his sleep apnea.  On exam Tony Combs was felt to have dysarthric and hypophonic speech with subtle ptosis of his left eye with left pupil being smaller in size in the right and because of his subjective complaints of diplopia, dysarthria and gait ataxia felt to have a small brainstem infarct not visualized on the MRI in addition to which was seen in the right subcortical region which could not explain his symptoms.  Tony Combs was discharged on aspirin and Plavix for 3 months followed by aspirin alone.  Tony Combs states that is obtained some improvement but his wife still becomes soft and slurred when Tony Combs is tired.  Is also has some intermittent swallowing difficulties.  Tony Combs also complains of persistent blurred and double vision particularly towards the end of the day.  His mornings are much better.  His balance is still not right so uses a cane to walk.  Tony Combs has had no falls or injuries.  His wife has noted that at the end of the day his eyes are not synchronous and the left eye is does not move as much as the right.  Tony Combs is tolerating aspirin well without bruising or bleeding.  His blood pressure is well controlled today it is 129/75.  His sugars are doing better and hemoglobin A1c is now down to 7.  His average blood glucose for the last 10 days is 142.  Tony Combs remains on Lipitor which is tolerating well without side effects.  Tony Combs has been compliant with using his CPAP mask which Tony Combs got through the sleep smart study and feels it is helping him.  Update 02/26/2021 : Tony Combs returns for follow-up after last visit 2-1/2 months ago.  Is recommended by his wife.  Tony Combs continues to have speech and swallowing difficulties which is more or less unchanged.  In his diet his speech often becomes slurred.  Tony Combs denies any double vision but wife does notice some drooping eyelids to the end of the day.  Tony Combs also has trouble swallowing at times.  Tony Combs  did not tolerate Mestinon due to cramps and diarrhea and Tony Combs takes only 30 mg once a day Tony Combs is not sure if this is helping him.  Tony Combs denies any trouble getting out of chair or walking or weakness of proximal shoulder or thigh muscles.  Lab work on 12/06/2020 shows strongly positive acetylcholine receptor binding antibody titer of 3.62 as well as acetylcholine blocking antibody in a titer of 95.  Tony Combs previously had CT scan of the chest done on 08/30/2020 which did not show any thymoma but showed some mediastinal lymphadenopathy.  Hemoglobin A1c done on 02/19/2021 was elevated at 7.1.  EMG nerve conduction study with rapid repetitive stimulation on 01/24/2021 was positive for decrement confirming diagnosis of myasthenia.  It also showed evidence of moderate right carpal tunnel and right ulnar nerve entrapment at the elbow.  Tony Combs does complain of hand paresthesias and some grip weakness.  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 Update 07/04/2021 ; Tony Combs returns for follow-up after last visit 4 months ago.  Tony Combs is accompanied by his exposures.  Tony Combs states Tony Combs is finished for injections of Vyvgart at 1 weekly intervals on 05/15/2021 and has noticed improvement in his speech and swallowing.  Tony Combs does occasionally get intermittent diplopia which is transient.  Tony Combs denies any drooping of eyelids or significant dysphagia or muscle weakness or fatigability.H his speech has remained quite good over the last 1 month despite not having injections.  Tony Combs had trouble tolerating Mestinon due to diarrhea in the past and is not a good candidate for long-term steroids given his obesity and comorbidities.  Tony Combs has had no recurrent stroke or TIA symptoms.  Tony Combs remains on aspirin which is tolerating well without bruising or bleeding and Lipitor without muscle aches and pains.  Tony Combs states has not been quite regular using his CPAP for his sleep apnea.  His blood pressures under good control today it is 139/73.  His  diabetes is yet suboptimally controlled with last A1c being 8.2.  Tony Combs has no new complaints.  Update 12/17/2021 Dr. Leonie Man: Tony Combs returns for follow-up after last visit 5 months ago.  Tony Combs is accompanied by his wife.  Tony Combs continues to have symptoms of his myasthenia, generalized fatigability and tiredness symptoms have diurnal fluctuation and appears more prominent when Tony Combs is tired in the meantime.  Eating as well.  Tony Combs voids also.  Tony Combs has trouble tolerating due to side effects and does not want to take steroids or  CellCept due to his uncontrolled diabetes and medical comorbidities.  Tony Combs was treated with 2 injections of Vygardt quite substantial improvement in all the symptoms of myasthenia that Tony Combs was having and this was sustained for around 2 months but his symptoms have returned since then.  Tony Combs can barely look up without seeing double with his diet to a midday and has trouble doing his meals and his wife today.  Insurance company refused further injections we will try Rystiggo instead at this time.  Continues to do well from stroke standpoint without recurrent stroke or TIA symptoms.  Tony Combs remains on aspirin which is tolerating well without side effects.  Tony Combs is tolerating Lipitor well and last lipid profile on 11/08/2021 showed LDL cholesterol to be optimal at 59.  His diabetes remains poorly controlled and last A1c was 8.9.  Tony Combs is not too compliant with his CPAP for his sleep apnea and I counseled him.   Update 04/09/2022 JM: Tony Combs returns for follow-up accompanied by his wife.  At prior visit, initiated [place] for [facility] but still waiting insurance approval, last update in epic 2/14 still pending. Overall feels about the same since last visit, continues to have fluctuation of generalized fatigability and weakness, can have good days and bad days. Usually does fairly well in the morning and symptoms can worsen in the evening.  Continue to use a cane for ambulation.  Will have occasional double vision  especially when looking from side to side or looking up.  Tony Combs continues to have swallowing difficulties, believes this is unchanged.  Stable from stroke standpoint without new or reoccurring stroke/TIA symptoms.  Compliant on aspirin and atorvastatin.  Blood pressure well-controlled.  Routinely follows with PCP.     ROS:   14 system review of systems is positive for those listed in HPI and all other systems negative  PMH:  Past Medical History:  Diagnosis Date   CAD in native artery 03/11/2021   Small Non-dominant RCA - prox 90% (CTA FFR 0.66) - Not favorable for PCI - small caliber, non-dominant (<2 mm) vessel.  Mininal diffuse disease in LCx.   Diabetes mellitus without complication ([place])    Dilated cardiomyopathy ([place]) 01/17/2021   In setting of stroke: TTE 08/31/2020: EF 30 and 35%.  Mildly decreased function.  Moderate LVH.  Unable to assess diastolic function.  Unable assess RV function.  Mild LA dilation.   Hyperlipidemia    Hypertension    Myasthenia gravis ([place]) 03/01/2021   Followed by Dr. Robyne Askew half dose Mestinon   Stroke ([place]) 08/30/2020   Right frontal lobe stroke with dysarthria and blurred vision.    Social History:  Social History   Socioeconomic History   Marital status: Married    Spouse name: Not on file   Number of children: Not on file   Years of education: Not on file   Highest education level: Not on file  Occupational History   Not on file  Tobacco Use   Smoking status: Never   Smokeless tobacco: Never  Vaping Use   Vaping Use: Never used  Substance and Sexual Activity   Alcohol use: Not Currently   Drug use: Not Currently   Sexual activity: Not Currently  Other Topics Concern   Not on file  Social History Narrative   Not on file   Social Determinants of Health   Financial Resource Strain: Not on file  Food Insecurity: Not on file  Transportation Needs: Not on file  Physical Activity: Not on file  Stress: Not on file  Social  Connections: Not on file  Intimate Partner Violence: Not on file    Medications:   Current Outpatient Medications on File Prior to Visit  Medication Sig Dispense Refill   aspirin EC 81 MG tablet Take 81 mg by mouth in the morning. Swallow whole.     atorvastatin (LIPITOR) 80 MG tablet Take 1 tablet (80 mg total) by mouth  daily. 30 tablet 0   B-D UF III MINI PEN NEEDLES 31G X 5 MM MISC SMARTSIG:1 Pre-Filled Pen Syringe SUB-Q 4 Times Daily     carvedilol (COREG) 6.25 MG tablet Take 2 tablets (12.5 mg total) by mouth 2 (two) times daily with a meal. 120 tablet 0   Continuous Blood Gluc Sensor (FREESTYLE LIBRE 2 SENSOR) MISC Use to monitor glucose continuously 6 each 3   insulin glargine, 1 Unit Dial, (TOUJEO) 300 UNIT/ML Solostar Pen Inject 20 Units into the skin at bedtime. 18 mL 3   Insulin Pen Needle (PEN NEEDLES) 30G X 5 MM MISC 1 pen by Does not apply route 4 (four) times daily - after meals and at bedtime. 200 each 1   NOVOLOG FLEXPEN 100 UNIT/ML FlexPen Inject 5-11 Units into the skin 3 (three) times daily with meals. 15 mL 3   spironolactone (ALDACTONE) 25 MG tablet Take 0.5 tablets (12.5 mg total) by mouth daily. 45 tablet 3   Current Facility-Administered Medications on File Prior to Visit  Medication Dose Route Frequency Provider Last Rate Last Admin   sodium chloride flush (NS) 0.9 % injection 10 mL  10 mL Intravenous PRN Leonie Man, MD   20 mL at 02/07/21 0925    Allergies:  No Known Allergies  Physical Exam Today's Vitals   04/09/22 1330  BP: 138/84  Pulse: 92  Weight: 234 lb (106.1 kg)  Height: '5\' 8"'$  (1.727 m)   Body mass index is 35.58 kg/m.   General: Obese middle-aged Caucasian male, seated, in no evident distress Head: head normocephalic and atraumatic.  Neck: supple with no carotid or supraclavicular bruits Cardiovascular: regular rate and rhythm, no murmurs Musculoskeletal: no deformity Skin:  no rash/petichiae Vascular:  Normal pulses all  extremities  Neurologic Exam Mental Status: Awake and fully alert. Oriented to place and time. Recent and remote memory intact. Attention span, concentration and fund of knowledge appropriate. Mood and affect appropriate.  Voice seems normal today.  No dysarthria. Cranial Nerves: Pupils equal, briskly reactive to light. Extraocular movements full without nystagmus. Visual fields full to confrontation. Hearing intact. Facial sensation intact. Face, tongue, palate moves normally and symmetrically.  Mild drooping of both eyelids on sustained upgaze but without subjective diplopia and no ophthalmoplegia. Motor: Normal bulk and tone. Normal strength in all tested extremity muscles. Sensory.: intact to touch ,pinprick .position and vibratory sensation.  Coordination: Rapid alternating movements normal in all extremities. Finger-to-nose and heel-to-shin performed accurately bilaterally. Gait and Station: Arises from chair without difficulty. Stance is normal. Gait demonstrates good stride length with decreased step height bilaterally and mild unsteadiness with use of cane.  Tandem walk and heel toe not attempted. Reflexes: 1+ and symmetric. Toes downgoing.        ASSESSMENT/PLAN: 62 year old Caucasian male with sudden onset of slurred speech, diplopia and dysphagia in July 2022 likely from small brainstem infarct not visualized on MRI from small vessel disease.  MRI at that time had shown also right periventricular white matter infarct which was probably silent.  Multiple vascular risk factors of hypertension, diabetes, hyperlipidemia obesity and sleep apnea.  Tony Combs has been randomized to the CPAP treatment arm of the sleep smart study.   Ongoing symptoms of increased fatigability tiredness and worsening speech and swallowing from antibody positive  myasthenia which responded well to Vyvgart infusions but insurance company has refused further injections therefore transitioned to [employer].      1.   Myasthenia gravis  -Laboratory evaluation 11/2020 - AChR 3.62,  acetylchol block 95  -symptoms generally stable since prior visit -fluctuation of extremity weakness worse towards the end of the day, intermittent diplopia, swallowing difficulty  -MGFA class II-III -- symptoms can fluctuate between mild to moderate extremity weakness  -MG-ADL score 7 -Rystiggo authorization still pending - last update 2/14. Will follow up next week to see if any updates  -Not currently on treatment  -Prior intolerance to Mestinon, steroids and cellcept contraindicated due to uncontrolled diabetes   -Insurance will no longer cover Vyvgart infusions  ADDENDUM 04/23/2022: Insurance continues to deny Rystigo. Will retry [employer] authorization for Vyvgart as Tony Combs was on this previously with great benefit.    2.  History of stroke  -Continue aspirin and atorvastatin for secondary stroke prevention measures  -Continue to follow closely with PCP for aggressive stroke risk factor management  -Stroke labs 01/2022: LDL 65, A1c 8.3  -Carotid ultrasound 12/2021 near normal      I spent 34 minutes of face-to-face and non-face-to-face time with Tony Combs and wife.  This included previsit chart review, lab review, study review, order entry, electronic health record documentation, Tony Combs and wife education and discussion regarding above diagnoses and treatment plan and answered all other questions to patients satisfaction   Frann Rider, [facility]  [facility] [address] [place], [place] [address]  Phone [phone] Fax [phone] Note: This document was prepared with digital dictation and possible smart phrase technology. Any transcriptional errors that result from this process are unintentional.

## 2022-04-09 ENCOUNTER — Encounter: Payer: Self-pay | Admitting: Adult Health

## 2022-04-09 ENCOUNTER — Ambulatory Visit: Payer: 59 | Admitting: Adult Health

## 2022-04-09 VITALS — BP 138/84 | HR 92 | Ht 68.0 in | Wt 234.0 lb

## 2022-04-09 DIAGNOSIS — Z8673 Personal history of transient ischemic attack (TIA), and cerebral infarction without residual deficits: Secondary | ICD-10-CM | POA: Diagnosis not present

## 2022-04-09 DIAGNOSIS — G7000 Myasthenia gravis without (acute) exacerbation: Secondary | ICD-10-CM | POA: Diagnosis not present

## 2022-04-09 NOTE — Telephone Encounter (Signed)
Greg @ [facility] is asking for a call re: the PA status for Rystiggo. Reg states [facility] is trying to get pt set up for home infusions.  Marya Amsler can be called back at [phone]

## 2022-04-09 NOTE — Patient Instructions (Signed)
Will follow up regarding approval for Rystiggo - you can also contact your insurance company to see what the status is. If you do not hear from them by next week, please call our office   Continue to follow with PCP for aggressive stroke risk factor management

## 2022-04-14 NOTE — Telephone Encounter (Signed)
Attempted to call, placed on hold, will call back again

## 2022-04-16 NOTE — Telephone Encounter (Signed)
Paperwork faxed to [facility]

## 2022-04-16 NOTE — Telephone Encounter (Signed)
Spoke to [place], states that Medical PA is still pending,  Informed him that we have not receive update from our PA team. Marya Amsler asked that I speak to Gwendalyn Ege at [facility] to for update. Attempted to call and LVM. If a new medical PA  or additional information is needed, we need that faxed to our office only at [phone]. Will call Roselyn Reef back today.

## 2022-04-16 NOTE — Telephone Encounter (Signed)
Spoke to [employer] medical form faxed to our office Pending

## 2022-04-16 NOTE — Telephone Encounter (Signed)
Can this please be followed up on again? Patient has been waiting since October to get this medication started. Thank you!

## 2022-04-23 ENCOUNTER — Telehealth: Payer: Self-pay | Admitting: Neurology

## 2022-04-23 DIAGNOSIS — G7000 Myasthenia gravis without (acute) exacerbation: Secondary | ICD-10-CM

## 2022-04-23 NOTE — Telephone Encounter (Signed)
Received a notification from [facility] that the PA was denied.

## 2022-04-23 NOTE — Telephone Encounter (Signed)
I have completed the order for for infusion for Vyvgart for the patient. I have attached all the paperwork and submitted to [facility] to have them re initiate authorization for the IV treatment for the patient.

## 2022-04-24 ENCOUNTER — Other Ambulatory Visit (INDEPENDENT_AMBULATORY_CARE_PROVIDER_SITE_OTHER): Payer: Self-pay

## 2022-04-24 DIAGNOSIS — G7000 Myasthenia gravis without (acute) exacerbation: Secondary | ICD-10-CM

## 2022-04-24 DIAGNOSIS — Z0289 Encounter for other administrative examinations: Secondary | ICD-10-CM

## 2022-04-24 NOTE — Addendum Note (Signed)
Addended by: Darleen Crocker on: 04/24/2022 09:04 AM   Modules accepted: Orders

## 2022-04-24 NOTE — Telephone Encounter (Signed)
Called the patient to advise that since the rystigo appeal is still denied we are going to attempt to complete PA again for vyvgart. Advised that because we are starting fresh with a new PA we want to make sure everything is submitted correctly so that we can get insurance approval  [facility]fusion [facility] has reached out stating that updated labs are needed prior to being able to submit PA. Pt made aware that we need to have this before we can submit auth. Advised of the [employer] hours.

## 2022-05-01 LAB — MYASTHENIA GRAVIS PROFILE
AChR Binding Ab, Serum: 2.27 nmol/L — ABNORMAL HIGH (ref 0.00–0.24)
AChR-modulating Ab: 67 % — ABNORMAL HIGH (ref 0–45)
Acetylchol Block Ab: 82 % — ABNORMAL HIGH (ref 0–25)
Anti-striation Abs: 1:3200 {titer} — ABNORMAL HIGH

## 2022-05-08 ENCOUNTER — Other Ambulatory Visit: Payer: Self-pay | Admitting: Nurse Practitioner

## 2022-05-08 ENCOUNTER — Ambulatory Visit: Payer: 59 | Admitting: Nurse Practitioner

## 2022-05-08 ENCOUNTER — Other Ambulatory Visit: Payer: Self-pay | Admitting: *Deleted

## 2022-05-08 ENCOUNTER — Encounter: Payer: Self-pay | Admitting: Family Medicine

## 2022-05-08 ENCOUNTER — Ambulatory Visit: Payer: 59 | Admitting: Family Medicine

## 2022-05-08 ENCOUNTER — Encounter: Payer: Self-pay | Admitting: Nurse Practitioner

## 2022-05-08 VITALS — BP 140/80 | HR 93 | Ht 68.0 in | Wt 239.4 lb

## 2022-05-08 VITALS — BP 167/94 | HR 93 | Ht 68.0 in | Wt 239.1 lb

## 2022-05-08 DIAGNOSIS — E1169 Type 2 diabetes mellitus with other specified complication: Secondary | ICD-10-CM

## 2022-05-08 DIAGNOSIS — E782 Mixed hyperlipidemia: Secondary | ICD-10-CM | POA: Diagnosis not present

## 2022-05-08 DIAGNOSIS — E7849 Other hyperlipidemia: Secondary | ICD-10-CM

## 2022-05-08 DIAGNOSIS — Z794 Long term (current) use of insulin: Secondary | ICD-10-CM

## 2022-05-08 DIAGNOSIS — Z1159 Encounter for screening for other viral diseases: Secondary | ICD-10-CM

## 2022-05-08 DIAGNOSIS — E785 Hyperlipidemia, unspecified: Secondary | ICD-10-CM

## 2022-05-08 DIAGNOSIS — I10 Essential (primary) hypertension: Secondary | ICD-10-CM | POA: Diagnosis not present

## 2022-05-08 DIAGNOSIS — I639 Cerebral infarction, unspecified: Secondary | ICD-10-CM

## 2022-05-08 DIAGNOSIS — E559 Vitamin D deficiency, unspecified: Secondary | ICD-10-CM | POA: Diagnosis not present

## 2022-05-08 DIAGNOSIS — E1165 Type 2 diabetes mellitus with hyperglycemia: Secondary | ICD-10-CM

## 2022-05-08 DIAGNOSIS — E1100 Type 2 diabetes mellitus with hyperosmolarity without nonketotic hyperglycemic-hyperosmolar coma (NKHHC): Secondary | ICD-10-CM

## 2022-05-08 DIAGNOSIS — E0789 Other specified disorders of thyroid: Secondary | ICD-10-CM | POA: Diagnosis not present

## 2022-05-08 LAB — POCT GLYCOSYLATED HEMOGLOBIN (HGB A1C): Hemoglobin A1C: 7.6 % — AB (ref 4.0–5.6)

## 2022-05-08 LAB — POCT UA - MICROALBUMIN

## 2022-05-08 MED ORDER — INSULIN GLARGINE (1 UNIT DIAL) 300 UNIT/ML ~~LOC~~ SOPN: 16.0000 [IU] | PEN_INJECTOR | Freq: Every day | SUBCUTANEOUS | 3 refills | Status: DC

## 2022-05-08 MED ORDER — NOVOLOG FLEXPEN 100 UNIT/ML ~~LOC~~ SOPN: 4.0000 [IU] | PEN_INJECTOR | Freq: Three times a day (TID) | SUBCUTANEOUS | 3 refills | Status: DC

## 2022-05-08 MED ORDER — TRESIBA FLEXTOUCH 100 UNIT/ML ~~LOC~~ SOPN: 16.0000 [IU] | PEN_INJECTOR | Freq: Every day | SUBCUTANEOUS | 3 refills | Status: DC

## 2022-05-08 MED ORDER — SPIRONOLACTONE 25 MG PO TABS: 12.5000 mg | ORAL_TABLET | Freq: Two times a day (BID) | ORAL | 1 refills | Status: DC

## 2022-05-08 MED ORDER — SPIRONOLACTONE 25 MG PO TABS: 25.0000 mg | ORAL_TABLET | Freq: Every day | ORAL | 1 refills | Status: DC

## 2022-05-08 NOTE — Telephone Encounter (Signed)
A Rx was sent to the patient's pharmacy for [place].

## 2022-05-08 NOTE — Assessment & Plan Note (Signed)
He has an appointment with Loree Fee, his endocrinologist today He reports compliance with his treatment regimen on Novolog 5 to 11 units 3 times daily and insulin glargine 20 units at bedtime

## 2022-05-08 NOTE — Progress Notes (Signed)
Established Patient Office Visit  Subjective:  Patient ID: Tony Combs, male    DOB: July 27, 1960  Age: 62 y.o. MRN: [id number]  CC:  Chief Complaint  Patient presents with   Follow-up    3 month f/u, pt would like a referral to a new cardiology.     HPI Tony Combs is a 62 y.o. male with past medical history of hypertension, type 2 diabetes, hyperlipidemia, and  chronic combined systolic and diastolic heart failure presents for f/u of  chronic medical conditions. For the details of today's visit, please refer to the assessment and plan.     Past Medical History:  Diagnosis Date   CAD in native artery 03/11/2021   Small Non-dominant RCA - prox 90% (CTA FFR 0.66) - Not favorable for PCI - small caliber, non-dominant (<2 mm) vessel.  Mininal diffuse disease in LCx.   Diabetes mellitus without complication ([place])    Dilated cardiomyopathy ([place]) 01/17/2021   In setting of stroke: TTE 08/31/2020: EF 30 and 35%.  Mildly decreased function.  Moderate LVH.  Unable to assess diastolic function.  Unable assess RV function.  Mild LA dilation.   Hyperlipidemia    Hypertension    Myasthenia gravis ([place]) 03/01/2021   Followed by Dr. Robyne Askew half dose Mestinon   Stroke (Tony Combs) 08/30/2020   Right frontal lobe stroke with dysarthria and blurred vision.    Past Surgical History:  Procedure Laterality Date   Coronary CT Angiogram  01/31/2021   Coronary calcium score 40.  No CAD RADS 3.  Moderate stenosis.  RCA has moderate obstructive plaque in the mid and distal vessel.  Otherwise 25-49% OM1. => CT FFR positive, 0.66 in the RCA.   HERNIA REPAIR     LEFT HEART CATH AND CORONARY ANGIOGRAPHY N/A 03/11/2021   Procedure: LEFT HEART CATH AND CORONARY ANGIOGRAPHY;  Surgeon: Leonie Man, MD;  Location: [facility];;   Prox RCA lesion is 90% stenosed.-Small to moderate caliber nondominant vessel (<2 mm) => not PCI amenable.  Not the cause of cardiomyopathy.   Otherwise minimal diffuse CAD and  a Left Dominant System.   LV EDP is normal with Systemic Hypertension (Central Aop ~20 mmHg > BP Cuff)   TRANSTHORACIC ECHOCARDIOGRAM  08/31/2020   a) EF 30 and 35%.  Mildly decreased function.  Moderate LVH.  Unable to assess diastolic function.  Unable assess RV function.  Mild LA dilation.; b) 12/21/2020: EF remains 30 to 35% with moderately reduced function.  Global HK.  GR 1 DD.  Normal RV size and function.  Normal aortic valves.  Cannot exclude small PFO. => 02/07/21 -> NEGATIVE BUBBLE STUDY.    Family History  Problem Relation Age of Onset   Hypertension Mother    Diabetes Mother    Heart failure Father 21   Heart attack Father 78   Hypertension Father    Diabetes type II Father    Coronary artery disease Father 31   Congestive Heart Failure Father    Diabetes Brother     Social History   Socioeconomic History   Marital status: Married    Spouse name: Not on file   Number of children: Not on file   Years of education: Not on file   Highest education level: Not on file  Occupational History   Not on file  Tobacco Use   Smoking status: Never   Smokeless tobacco: Never  Vaping Use   Vaping Use: Never used  Substance and  Sexual Activity   Alcohol use: Not Currently   Drug use: Not Currently   Sexual activity: Not Currently  Other Topics Concern   Not on file  Social History Narrative   Not on file   Social Determinants of Health   Financial Resource Strain: Not on file  Food Insecurity: Not on file  Transportation Needs: Not on file  Physical Activity: Not on file  Stress: Not on file  Social Connections: Not on file  Intimate Partner Violence: Not on file    Outpatient Medications Prior to Visit  Medication Sig Dispense Refill   aspirin EC 81 MG tablet Take 81 mg by mouth in the morning. Swallow whole.     atorvastatin (LIPITOR) 80 MG tablet Take 1 tablet (80 mg total) by mouth daily. 30 tablet 0   B-D UF III MINI PEN NEEDLES 31G X 5 MM MISC SMARTSIG:1  Pre-Filled Pen Syringe SUB-Q 4 Times Daily     Continuous Blood Gluc Sensor (FREESTYLE LIBRE 2 SENSOR) MISC Use to monitor glucose continuously 6 each 3   Insulin Pen Needle (PEN NEEDLES) 30G X 5 MM MISC 1 pen by Does not apply route 4 (four) times daily - after meals and at bedtime. 200 each 1   insulin glargine, 1 Unit Dial, (TOUJEO) 300 UNIT/ML Solostar Pen Inject 20 Units into the skin at bedtime. 18 mL 3   NOVOLOG FLEXPEN 100 UNIT/ML FlexPen Inject 5-11 Units into the skin 3 (three) times daily with meals. 15 mL 3   carvedilol (COREG) 6.25 MG tablet Take 2 tablets (12.5 mg total) by mouth 2 (two) times daily with a meal. 120 tablet 0   spironolactone (ALDACTONE) 25 MG tablet Take 0.5 tablets (12.5 mg total) by mouth daily. (Patient not taking: Reported on 05/08/2022) 45 tablet 3   Facility-Administered Medications Prior to Visit  Medication Dose Route Frequency Provider Last Rate Last Admin   sodium chloride flush (NS) 0.9 % injection 10 mL  10 mL Intravenous PRN Leonie Man, MD   20 mL at 02/07/21 0925    No Known Allergies  ROS Review of Systems  Constitutional:  Negative for fatigue and fever.  Eyes:  Negative for visual disturbance.  Respiratory:  Negative for chest tightness and shortness of breath.   Cardiovascular:  Negative for chest pain and palpitations.  Neurological:  Negative for dizziness and headaches.      Objective:    Physical Exam HENT:     Head: Normocephalic.     Right Ear: External ear normal.     Left Ear: External ear normal.     Nose: No congestion or rhinorrhea.     Mouth/Throat:     Mouth: Mucous membranes are moist.  Cardiovascular:     Rate and Rhythm: Regular rhythm.     Heart sounds: No murmur heard. Pulmonary:     Effort: No respiratory distress.     Breath sounds: Normal breath sounds.  Neurological:     Mental Status: He is alert.     BP (!) 167/94   Pulse 93   Ht 5\' 8"  (1.727 m)   Wt 239 lb 1.3 oz (108.4 kg)   SpO2 97%    BMI 36.35 kg/m  Wt Readings from Last 3 Encounters:  05/08/22 239 lb 6.4 oz (108.6 kg)  05/08/22 239 lb 1.3 oz (108.4 kg)  04/09/22 234 lb (106.1 kg)    Lab Results  Component Value Date   TSH 1.920 02/03/2022   Lab Results  Component Value Date   WBC 6.4 02/03/2022   HGB 12.7 (L) 02/03/2022   HCT 38.1 02/03/2022   MCV 85 02/03/2022   PLT 192 02/03/2022   Lab Results  Component Value Date   NA 139 02/03/2022   K 4.2 02/03/2022   CO2 26 02/03/2022   GLUCOSE 132 (H) 02/03/2022   BUN 10 02/03/2022   CREATININE 0.82 02/03/2022   BILITOT 0.5 02/03/2022   ALKPHOS 94 02/03/2022   AST 22 02/03/2022   ALT 33 02/03/2022   PROT 6.7 02/03/2022   ALBUMIN 4.1 02/03/2022   CALCIUM 8.9 02/03/2022   ANIONGAP 5 09/02/2020   EGFR 99 02/03/2022   Lab Results  Component Value Date   CHOL 124 02/03/2022   Lab Results  Component Value Date   HDL 34 (L) 02/03/2022   Lab Results  Component Value Date   LDLCALC 65 02/03/2022   Lab Results  Component Value Date   TRIG 144 02/03/2022   Lab Results  Component Value Date   CHOLHDL 3.6 02/03/2022   Lab Results  Component Value Date   HGBA1C 7.6 (A) 05/08/2022      Assessment & Plan:  Essential hypertension Assessment & Plan: Uncontrolled He reports that he has not been taking Entresto 49-51 twice daily due to his insurance not paying for the medication He has been taking carvedilol 12.5 mg twice daily and spironolactone 12.5 mg daily The patient is asymptomatic today in the clinic The patient's partner reports that he had an episode of syncope with low BPs while in church a week ago Unclear etiology of his syncope episode Today, his blood pressure is elevated, and the patient would like a referral to a new cardiologist Referral placed Encouraged the patient to take spironolactone 12.5 mg twice daily Encouraged the patient to check his BP daily at home and hold his antihypertensive if his blood pressure is less than  90/60 Will follow up on his BP in the week   Orders: -     Spironolactone; Take 0.5 tablets (12.5 mg total) by mouth 2 (two) times daily.  Dispense: 60 tablet; Refill: 1  Hyperlipidemia associated with type 2 diabetes mellitus ([place]) Assessment & Plan: He currently takes atorvastatin 80 mg daily Denies muscle aches and pain Pending lipid panel Lab Results  Component Value Date   CHOL 124 02/03/2022   HDL 34 (L) 02/03/2022   LDLCALC 65 02/03/2022   TRIG 144 02/03/2022   CHOLHDL 3.6 02/03/2022      Type 2 diabetes mellitus with hyperosmolarity without coma, without long-term current use of insulin ([facility]) Assessment & Plan: He has an appointment with Loree Fee, his endocrinologist today He reports compliance with his treatment regimen on Novolog 5 to 11 units 3 times daily and insulin glargine 20 units at bedtime   Orders: -     Microalbumin / creatinine urine ratio -     CBC with Differential/Platelet -     CMP14+EGFR -     Hemoglobin A1c  Other hyperlipidemia -     Lipid panel  Vitamin D deficiency -     VITAMIN D 25 Hydroxy (Vit-D Deficiency, Fractures)  Encounter for hepatitis C screening test for low risk patient -     Hepatitis C antibody  Other specified disorders of thyroid -     TSH  Acute CVA (cerebrovascular accident) ([place]) -     Ambulatory referral to Cardiology    Follow-up: Return in about 1 month (around 06/08/2022) for BP.  Alvira Monday, FNP

## 2022-05-08 NOTE — Progress Notes (Signed)
Endocrinology Follow Up Note       05/08/2022, 10:49 AM   Subjective:    Patient ID: Tony Combs, male    DOB: Mar 05, 1960.  Tony Combs is being seen in follow up after being seen in consultation for management of currently uncontrolled symptomatic diabetes requested by  Alvira Monday, [place].   Past Medical History:  Diagnosis Date   CAD in native artery 03/11/2021   Small Non-dominant RCA - prox 90% (CTA FFR 0.66) - Not favorable for PCI - small caliber, non-dominant (<2 mm) vessel.  Mininal diffuse disease in LCx.   Diabetes mellitus without complication ([place])    Dilated cardiomyopathy ([place]) 01/17/2021   In setting of stroke: TTE 08/31/2020: EF 30 and 35%.  Mildly decreased function.  Moderate LVH.  Unable to assess diastolic function.  Unable assess RV function.  Mild LA dilation.   Hyperlipidemia    Hypertension    Myasthenia gravis ([place]) 03/01/2021   Followed by Dr. 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    Past Surgical History:  Procedure Laterality Date   Coronary CT Angiogram  01/31/2021   Coronary calcium score 40.  No CAD RADS 3.  Moderate stenosis.  RCA has moderate obstructive plaque in the mid and distal vessel.  Otherwise 25-49% OM1. => CT FFR positive, 0.66 in the RCA.   HERNIA REPAIR     LEFT HEART CATH AND CORONARY ANGIOGRAPHY N/A 03/11/2021   Procedure: LEFT HEART CATH AND CORONARY ANGIOGRAPHY;  Surgeon: Leonie Man, MD;  Location: [facility];;   Prox RCA lesion is 90% stenosed.-Small to moderate caliber nondominant vessel (<2 mm) => not PCI amenable.  Not the cause of cardiomyopathy.   Otherwise minimal diffuse CAD and a Left Dominant System.   LV EDP is normal with Systemic Hypertension (Central Aop ~20 mmHg > BP Cuff)   TRANSTHORACIC ECHOCARDIOGRAM  08/31/2020   a) EF 30 and 35%.  Mildly decreased  function.  Moderate LVH.  Unable to assess diastolic function.  Unable assess RV function.  Mild LA dilation.; b) 12/21/2020: EF remains 30 to 35% with moderately reduced function.  Global HK.  GR 1 DD.  Normal RV size and function.  Normal aortic valves.  Cannot exclude small PFO. => 02/07/21 -> NEGATIVE BUBBLE STUDY.    Social History   Socioeconomic History   Marital status: Married    Spouse name: Not on file   Number of children: Not on file   Years of education: Not on file   Highest education level: Not on file  Occupational History   Not on file  Tobacco Use   Smoking status: Never   Smokeless tobacco: Never  Vaping Use   Vaping Use: Never used  Substance and Sexual Activity   Alcohol use: Not Currently   Drug use: Not Currently   Sexual activity: Not Currently  Other Topics Concern   Not on file  Social History Narrative   Not on file   Social Determinants of Health   Financial Resource Strain: Not on file  Food Insecurity: Not on file  Transportation  Needs: Not on file  Physical Activity: Not on file  Stress: Not on file  Social Connections: Not on file    Family History  Problem Relation Age of Onset   Hypertension Mother    Diabetes Mother    Heart failure Father 73   Heart attack Father 73   Hypertension Father    Diabetes type II Father    Coronary artery disease Father 37   Congestive Heart Failure Father    Diabetes Brother     Outpatient Encounter Medications as of 05/08/2022  Medication Sig   aspirin EC 81 MG tablet Take 81 mg by mouth in the morning. Swallow whole.   atorvastatin (LIPITOR) 80 MG tablet Take 1 tablet (80 mg total) by mouth daily.   B-D UF III MINI PEN NEEDLES 31G X 5 MM MISC SMARTSIG:1 Pre-Filled Pen Syringe SUB-Q 4 Times Daily   carvedilol (COREG) 6.25 MG tablet Take 2 tablets (12.5 mg total) by mouth 2 (two) times daily with a meal.   Continuous Blood Gluc Sensor (FREESTYLE LIBRE 2 SENSOR) MISC Use to monitor glucose  continuously   Insulin Pen Needle (PEN NEEDLES) 30G X 5 MM MISC 1 pen by Does not apply route 4 (four) times daily - after meals and at bedtime.   spironolactone (ALDACTONE) 25 MG tablet Take 0.5 tablets (12.5 mg total) by mouth 2 (two) times daily.   [DISCONTINUED] insulin glargine, 1 Unit Dial, (TOUJEO) 300 UNIT/ML Solostar Pen Inject 20 Units into the skin at bedtime.   [DISCONTINUED] NOVOLOG FLEXPEN 100 UNIT/ML FlexPen Inject 5-11 Units into the skin 3 (three) times daily with meals.   insulin glargine, 1 Unit Dial, (TOUJEO) 300 UNIT/ML Solostar Pen Inject 16 Units into the skin at bedtime.   NOVOLOG FLEXPEN 100 UNIT/ML FlexPen Inject 4-10 Units into the skin 3 (three) times daily with meals.   spironolactone (ALDACTONE) 25 MG tablet Take 0.5 tablets (12.5 mg total) by mouth daily. (Patient not taking: Reported on 05/08/2022)   [DISCONTINUED] spironolactone (ALDACTONE) 25 MG tablet Take 1 tablet (25 mg total) by mouth daily.   Facility-Administered Encounter Medications as of 05/08/2022  Medication   sodium chloride flush (NS) 0.9 % injection 10 mL    ALLERGIES: No Known Allergies  VACCINATION STATUS: Immunization History  Administered Date(s) Administered   Tdap 11/28/2021   Zoster Recombinat (Shingrix) 11/28/2021, 02/06/2022    Diabetes He presents for his follow-up diabetic visit. He has type 2 diabetes mellitus. Onset time: Diagnosed at approx age of 63. His disease course has been improving. There are no hypoglycemic associated symptoms. Pertinent negatives for diabetes include no fatigue. There are no hypoglycemic complications. Symptoms are stable. Diabetic complications include a CVA and heart disease. Risk factors for coronary artery disease include diabetes mellitus, dyslipidemia, family history, male sex, obesity, hypertension and sedentary lifestyle. Current diabetic treatment includes intensive insulin program. He is compliant with treatment most of the time. His weight is  stable. He is following a generally healthy diet. When asked about meal planning, he reported none. He has not had a previous visit with a dietitian. He rarely participates in exercise. His home blood glucose trend is decreasing steadily. His overall blood glucose range is 140-180 mg/dl. (He presents today with his CGM, no logs, showing improved, mostly at goal glycemic profile.  His POCT A1c today is 7.6% improving from last visit of 8.3%.  Analysis of his CGM shows TIR 52%, TAR 47%, TBR 1% with a GMI of 7.6%.  He does have  some mild hypoglycemia noted at random times, perhaps due to meal timing being off.  He notes he has been taking his Novolog after he eats and is really trying not to have to inject it altogether, says if it goes over 200 that's typically when he will inject Novolog, usually does 10 units regardless of what the SSI chart recommends.) An ACE inhibitor/angiotensin II receptor blocker is being taken. He does not see a podiatrist.Eye exam is current.  Hypertension This is a chronic problem. The current episode started more than 1 year ago. The problem has been resolved since onset. The problem is controlled. There are no associated agents to hypertension. Risk factors for coronary artery disease include diabetes mellitus, dyslipidemia, family history, obesity, male gender and sedentary lifestyle. Past treatments include beta blockers, diuretics and angiotensin blockers. The current treatment provides significant improvement. There are no compliance problems.  Hypertensive end-organ damage includes CAD/MI and CVA.  Hyperlipidemia This is a chronic problem. The current episode started more than 1 year ago. The problem is controlled. Recent lipid tests were reviewed and are normal. Exacerbating diseases include diabetes and obesity. Factors aggravating his hyperlipidemia include beta blockers and fatty foods. Current antihyperlipidemic treatment includes statins. The current treatment provides  moderate improvement of lipids. Compliance problems include adherence to diet and adherence to exercise.  Risk factors for coronary artery disease include diabetes mellitus, dyslipidemia, family history, hypertension, male sex, obesity and a sedentary lifestyle.     Review of systems  Constitutional: + Minimally fluctuating body weight, current Body mass index is 36.4 kg/m., + fatigue, no subjective hyperthermia, no subjective hypothermia Eyes: no blurry vision, no xerophthalmia ENT: no sore throat, no nodules palpated in throat, + dysphagia from previous stroke, no hoarseness Cardiovascular: no chest pain, no shortness of breath, no palpitations, no leg swelling Respiratory: no cough, no shortness of breath Gastrointestinal: no nausea/vomiting/diarrhea Musculoskeletal: no muscle/joint aches Skin: no rashes, no hyperemia Neurological: no tremors, no numbness, no tingling, no dizziness Psychiatric: no depression, no anxiety  Objective:     BP (!) 140/80 (BP Location: Right Arm, Patient Position: Sitting, Cuff Size: Large) Comment: Retake manuel cuff-patient has not taken BP med this morning  Pulse 93   Ht 5\' 8"  (1.727 m)   Wt 239 lb 6.4 oz (108.6 kg)   BMI 36.40 kg/m   Wt Readings from Last 3 Encounters:  05/08/22 239 lb 6.4 oz (108.6 kg)  05/08/22 239 lb 1.3 oz (108.4 kg)  04/09/22 234 lb (106.1 kg)     BP Readings from Last 3 Encounters:  05/08/22 (!) 140/80  05/08/22 (!) 167/94  04/09/22 138/84      Physical Exam- Limited  Constitutional:  Body mass index is 36.4 kg/m. , not in acute distress, normal state of mind Eyes:  EOMI, no exophthalmos Musculoskeletal: no gross deformities, strength intact in all four extremities, no gross restriction of joint movements Skin:  no rashes, no hyperemia Neurological: no tremor with outstretched hands    Diabetic Foot Exam - Simple   No data filed       CMP ( most recent) CMP     Component Value Date/Time   NA 139  02/03/2022 0817   K 4.2 02/03/2022 0817   CL 99 02/03/2022 0817   CO2 26 02/03/2022 0817   GLUCOSE 132 (H) 02/03/2022 0817   GLUCOSE 161 (H) 09/02/2020 0451   BUN 10 02/03/2022 0817   CREATININE 0.82 02/03/2022 0817   CALCIUM 8.9 02/03/2022 0817   PROT  6.7 02/03/2022 0817   ALBUMIN 4.1 02/03/2022 0817   AST 22 02/03/2022 0817   ALT 33 02/03/2022 0817   ALKPHOS 94 02/03/2022 0817   BILITOT 0.5 02/03/2022 0817   GFRNONAA 95 09/23/2020 0000   GFRNONAA >60 09/02/2020 0451     Diabetic Labs (most recent): Lab Results  Component Value Date   HGBA1C 7.6 (A) 05/08/2022   HGBA1C 8.3 (H) 02/03/2022   HGBA1C 8.9 (H) 11/08/2021   MICROALBUR 80mg /L 05/08/2022   MICROALBUR 10 11/15/2020     Lipid Panel ( most recent) Lipid Panel     Component Value Date/Time   CHOL 124 02/03/2022 0817   TRIG 144 02/03/2022 0817   HDL 34 (L) 02/03/2022 0817   CHOLHDL 3.6 02/03/2022 0817   CHOLHDL 3.7 03/11/2021 0756   VLDL 21 03/11/2021 0756   LDLCALC 65 02/03/2022 0817   LABVLDL 25 02/03/2022 0817      Lab Results  Component Value Date   TSH 1.920 02/03/2022   TSH 1.290 11/08/2021   TSH 0.787 09/01/2020   FREET4 1.13 02/03/2022   FREET4 1.20 11/08/2021           Assessment & Plan:   1) Type 2 diabetes mellitus with hyperglycemia, with long-term current use of insulin ([place])  He presents today with his CGM, no logs, showing improved, mostly at goal glycemic profile.  His POCT A1c today is 7.6% improving from last visit of 8.3%.  Analysis of his CGM shows TIR 52%, TAR 47%, TBR 1% with a GMI of 7.6%.  He does have some mild hypoglycemia noted at random times, perhaps due to meal timing being off.  He notes he has been taking his Novolog after he eats and is really trying not to have to inject it altogether, says if it goes over 200 that's typically when he will inject Novolog, usually does 10 units regardless of what the SSI chart recommends.  - Tony Combs has currently uncontrolled  symptomatic type 2 DM since 62 years of age.  -Recent labs reviewed.  - I had a long discussion with him about the progressive nature of diabetes and the pathology behind its complications. -his diabetes is complicated by CVA, CAD and he remains at a high risk for more acute and chronic complications which include CAD, CVA, CKD, retinopathy, and neuropathy. These are all discussed in detail with him.  - Nutritional counseling repeated at each appointment due to patients tendency to fall back in to old habits.  - The patient admits there is a room for improvement in their diet and drink choices. -  Suggestion is made for the patient to avoid simple carbohydrates from their diet including Cakes, Sweet Desserts / Pastries, Ice Cream, Soda (diet and regular), Sweet Tea, Candies, Chips, Cookies, Sweet Pastries, Store Bought Juices, Alcohol in Excess of 1-2 drinks a day, Artificial Sweeteners, Coffee Creamer, and "Sugar-free" Products. This will help patient to have stable blood glucose profile and potentially avoid unintended weight gain.   - I encouraged the patient to switch to unprocessed or minimally processed complex starch and increased protein intake (animal or plant source), fruits, and vegetables.   - Patient is advised to stick to a routine mealtimes to eat 3 meals a day and avoid unnecessary snacks (to snack only to correct hypoglycemia).  - he will be scheduled with Jearld Fenton, RDN, CDE for diabetes education.    - I have approached him with the following individualized plan to manage his diabetes and  patient agrees:   -He is advised to lower his Toujeo to 16 units SQ nightly and lower his Novolog to 4-10 units TID with meals if glucose is above 90 and he is eating (Specific instructions on how to titrate insulin dosage based on glucose readings given to patient in writing).  I encouraged him to stick to the SSI chart and to inject more consistently before meals.   -he is encouraged  to continue monitoring glucose 4 times daily (using his CGM), before meals and before bed, and to call the clinic if he has readings less than 70 or above 300 for 3 tests in a row.    - he is warned not to take insulin without proper monitoring per orders. - Adjustment parameters are given to him for hypo and hyperglycemia in writing.  - Specific targets for  A1c; LDL, HDL, and Triglycerides were discussed with the patient.  2) Blood Pressure /Hypertension:  his blood pressure is not controlled to target but he had not yet taken his medication prior to today's appt.   he is advised to continue his current medications including Coreg 6.25 mg p.o. twice daily, Entresto 49-51 mg po daily, and Spironolactone 25 mg po daily.  3) Lipids/Hyperlipidemia:    Review of his recent lipid panel from 02/03/22 showed controlled LDL at 65.  he is advised to continue Lipitor 80 mg daily at bedtime.  Side effects and precautions discussed with him.  4)  Weight/Diet:  his Body mass index is 36.4 kg/m.  -  clearly complicating his diabetes care.   he is a candidate for weight loss. I discussed with him the fact that loss of 5 - 10% of his  current body weight will have the most impact on his diabetes management.  Exercise, and detailed carbohydrates information provided  -  detailed on discharge instructions.  5) Vitamin D Deficiency His recent vitamin D level on 02/03/22 was 29.1.  He is currently on supplementation.  6) Chronic Care/Health Maintenance: -he is on ACEI/ARB and Statin medications and is encouraged to initiate and continue to follow up with Ophthalmology, Dentist, Podiatrist at least yearly or according to recommendations, and advised to stay away from smoking. I have recommended yearly flu vaccine and pneumonia vaccine at least every 5 years; moderate intensity exercise for up to 150 minutes weekly; and sleep for at least 7 hours a day.  - he is advised to maintain close follow up with Alvira Monday, FNP for primary care needs, as well as his other providers for optimal and coordinated care.      I spent  43  minutes in the care of the patient today including review of labs from [facility], Lipids, Thyroid Function, Hematology (current and previous including abstractions from other facilities); face-to-face time discussing  his blood glucose readings/logs, discussing hypoglycemia and hyperglycemia episodes and symptoms, medications doses, his options of short and long term treatment based on the latest standards of care / guidelines;  discussion about incorporating lifestyle medicine;  and documenting the encounter. Risk reduction counseling performed per USPSTF guidelines to reduce obesity and cardiovascular risk factors.     Please refer to Patient Instructions for Blood Glucose Monitoring and Insulin/Medications Dosing Guide"  in media tab for additional information. Please  also refer to " Patient Self Inventory" in the Media  tab for reviewed elements of pertinent patient history.  Tony Combs participated in the discussions, expressed understanding, and voiced agreement with the above plans.  All  questions were answered to his satisfaction. he is encouraged to contact clinic should he have any questions or concerns prior to his return visit.     Follow up plan: - Return in about 4 months (around 09/07/2022) for Diabetes F/U with A1c in office, No previsit labs, Bring meter and logs.   Rayetta Pigg, [facility] [address] [place], [place] [address] Phone: [phone] Fax: [phone]  05/08/2022, 10:49 AM

## 2022-05-08 NOTE — Telephone Encounter (Signed)
May change to different long acting insulin that is covered under his plan.

## 2022-05-08 NOTE — Assessment & Plan Note (Signed)
He currently takes atorvastatin 80 mg daily Denies muscle aches and pain Pending lipid panel Lab Results  Component Value Date   CHOL 124 02/03/2022   HDL 34 (L) 02/03/2022   LDLCALC 65 02/03/2022   TRIG 144 02/03/2022   CHOLHDL 3.6 02/03/2022

## 2022-05-08 NOTE — Patient Instructions (Addendum)
   I appreciate the opportunity to provide care to you today!    Follow up:  1 month for you BP  Labs: please stop by the lab today/ during the week  to get your blood drawn (CBC, CMP, TSH, Lipid profile, HgA1c, Vit D)  Please start taking Spironolactone 12.5 mg twice daily Please check your blood pressure daily and hold your antihypertensive if your blood pressure is less than 90/60 Your blood pressure goal is less than 140/90 Please increase your fluid intake, at least 64 ounces daily I recommend low-sodium diet with increased physical activity   Please schedule your annual eye exam  Referrals today-  Cardiology   Please continue to a heart-healthy diet and increase your physical activities. Try to exercise for 40mins at least five days a week.      It was a pleasure to see you and I look forward to continuing to work together on your health and well-being. Please do not hesitate to call the office if you need care or have questions about your care.   Have a wonderful day and week. With Gratitude, Alvira Monday MSN, FNP-BC

## 2022-05-08 NOTE — Assessment & Plan Note (Addendum)
Uncontrolled He reports that he has not been taking Entresto 49-51 twice daily due to his insurance not paying for the medication He has been taking carvedilol 12.5 mg twice daily and spironolactone 12.5 mg daily The patient is asymptomatic today in the clinic The patient's partner reports that he had an episode of syncope with low BPs while in church a week ago Unclear etiology of his syncope episode Today, his blood pressure is elevated, and the patient would like a referral to a new cardiologist Referral placed Encouraged the patient to take spironolactone 12.5 mg twice daily Encouraged the patient to check his BP daily at home and hold his antihypertensive if his blood pressure is less than 90/60 Will follow up on his BP in the week

## 2022-05-09 ENCOUNTER — Telehealth: Payer: Self-pay | Admitting: Family Medicine

## 2022-05-09 NOTE — Telephone Encounter (Signed)
Patient spouse called said the referral was sent to the wrong place, it was sent to Dr Selena Batten wrong provider, needs to be sent to  Dr Rudean Haskell.

## 2022-05-13 ENCOUNTER — Telehealth: Payer: Self-pay | Admitting: Cardiology

## 2022-05-13 NOTE — Telephone Encounter (Signed)
New Message:    Patient would like to switch from Dr Ellyn Hack to Dr Adline Peals . His wife is a patient of DR Gasper Sells, he would like to be his patient too. Is this alright with both of you?

## 2022-05-16 NOTE — Telephone Encounter (Signed)
Fine by me 

## 2022-05-21 ENCOUNTER — Other Ambulatory Visit: Payer: Self-pay | Admitting: Cardiology

## 2022-05-28 DIAGNOSIS — G7000 Myasthenia gravis without (acute) exacerbation: Secondary | ICD-10-CM | POA: Diagnosis not present

## 2022-05-28 DIAGNOSIS — G7001 Myasthenia gravis with (acute) exacerbation: Secondary | ICD-10-CM | POA: Diagnosis not present

## 2022-06-04 DIAGNOSIS — G7000 Myasthenia gravis without (acute) exacerbation: Secondary | ICD-10-CM | POA: Diagnosis not present

## 2022-06-11 ENCOUNTER — Telehealth: Payer: Self-pay | Admitting: Neurology

## 2022-06-11 DIAGNOSIS — G7000 Myasthenia gravis without (acute) exacerbation: Secondary | ICD-10-CM | POA: Diagnosis not present

## 2022-06-11 NOTE — Telephone Encounter (Signed)
Contacted pt, LVM rq call back  

## 2022-06-11 NOTE — Telephone Encounter (Signed)
Pt needs to schedule an appointment with Dr. Pearlean Brownie in order to continue his infusion treatment. His next treatment is 5/1. Dr. Pearlean Brownie has no appointments open until August or September. Can he be added as a work in or can he see an NP? Thenak you

## 2022-06-11 NOTE — Telephone Encounter (Signed)
Sent to Pod 4 in error Sorry this is for Pod 3

## 2022-06-12 NOTE — Telephone Encounter (Signed)
Tony Combs has his infusion at 8am Wed 06/18/22. Dr Pearlean Brownie, please stop by and see him for a few minutes or give him a call prior to infusion to check in with him and see how he is doing. We need documentation from a provider saying that there has been improvement, for insurance to continue coverage.

## 2022-06-16 NOTE — Telephone Encounter (Signed)
Noted  

## 2022-06-18 DIAGNOSIS — G7000 Myasthenia gravis without (acute) exacerbation: Secondary | ICD-10-CM | POA: Diagnosis not present

## 2022-06-23 ENCOUNTER — Telehealth: Payer: Self-pay | Admitting: Neurology

## 2022-06-23 NOTE — Telephone Encounter (Signed)
Patient was seen in the office for IV infusion for [place]  Patient states he had done well with the initial infusion with sustained benefit for few months.  Since then has had relapsing of his symptoms. Patient states that he has been having increasing fatigability and tiredness particularly after exertion and towards the evening. He does complain of mild diplopia and ptosis when tired and with some exertion. On neurological exam today is awake alert oriented to time place and person.  Eye movements are full range.  He has mild ptosis of the on sustained upgaze for more than 30 seconds bilaterally left greater than right. He has no focal weakness.  She does have fatigability and arms outstretched and padded about shoulders for more than 20 seconds  Patient will be given the infusion today and monitored as per protocol   Delia Heady, MD

## 2022-06-28 ENCOUNTER — Other Ambulatory Visit: Payer: Self-pay | Admitting: Cardiology

## 2022-07-09 ENCOUNTER — Ambulatory Visit: Payer: 59 | Admitting: Family Medicine

## 2022-07-21 DIAGNOSIS — G7000 Myasthenia gravis without (acute) exacerbation: Secondary | ICD-10-CM | POA: Diagnosis not present

## 2022-07-23 NOTE — Progress Notes (Unsigned)
Cardiology Office Note:    Date:  07/24/2022   ID:  Tony Combs, Tony Combs [birth year], MRN [id number]  PCP:  Billie Lade, MD   [facility] Providers Cardiologist:  Bryan Lemma, MD     Referring MD: Gilmore Laroche, FNP   CC: Transition to new cardiologist  History of Present Illness:    Tony Combs is a 62 y.o. 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  Myasthenia Gravis seems slightly better with infusions. There are no interval hospital/ED visit.    Rare chest pain or pressure; rarely takes as ASA.  Two episode of CP in May. No SOB/DOE and no PND/Orthopnea.  He has sleep apnea.  No weight gain or leg swelling.  Ankle swelling is rare but occurs at the end of the day. No palpitations or syncope . Wife notes that he was worsening fatigue.   Past Medical History:  Diagnosis Date   CAD in native artery 03/11/2021   Small Non-dominant RCA - prox 90% (CTA FFR 0.66) - Not favorable for PCI - small caliber, non-dominant (<2 mm) vessel.  Mininal diffuse disease in LCx.   Diabetes mellitus without complication (HCC)    Dilated cardiomyopathy (HCC) 01/17/2021   In setting of stroke: TTE 08/31/2020: EF 30 and 35%.  Mildly decreased function.  Moderate LVH.  Unable to assess diastolic function.  Unable assess RV function.  Mild LA dilation.   Hyperlipidemia    Hypertension    Myasthenia gravis (HCC) 03/01/2021   Followed by Dr. 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    Past Surgical History:  Procedure Laterality Date   Coronary CT Angiogram  01/31/2021   Coronary calcium score 40.  No CAD RADS 3.  Moderate stenosis.  RCA has moderate obstructive plaque in the mid and distal vessel.  Otherwise 25-49% OM1. => CT FFR positive, 0.66 in the RCA.    HERNIA REPAIR     LEFT HEART CATH AND CORONARY ANGIOGRAPHY N/A 03/11/2021   Procedure: LEFT HEART CATH AND CORONARY ANGIOGRAPHY;  Surgeon: [name], MD;  Location: [facility] INVASIVE CV LAB;;   Prox RCA lesion is 90% stenosed.-Small to moderate caliber nondominant vessel (<2 mm) => not PCI amenable.  Not the cause of cardiomyopathy.   Otherwise minimal diffuse CAD and a Left Dominant System.   LV EDP is normal with Systemic Hypertension (Central Aop ~20 mmHg > BP Cuff)   TRANSTHORACIC ECHOCARDIOGRAM  08/31/2020   a) EF 30 and 35%.  Mildly decreased function.  Moderate LVH.  Unable to assess diastolic function.  Unable assess RV function.  Mild LA dilation.; b) 12/21/2020: EF remains 30 to 35% with moderately reduced function.  Global HK.  GR 1 DD.  Normal RV size and function.  Normal aortic valves.  Cannot exclude small PFO. => 02/07/21 -> NEGATIVE BUBBLE STUDY.    Current Medications: Current Meds  Medication Sig   aspirin EC 81 MG tablet Take 81 mg by mouth in the morning. Swallow whole.   B-D UF III MINI PEN NEEDLES 31G X 5 MM MISC SMARTSIG:1 Pre-Filled Pen Syringe SUB-Q 4 Times Daily   carvedilol (COREG) 6.25 MG tablet Take 2 tablets (12.5 mg total) by mouth 2 (two) times daily with a meal.   Continuous  Blood Gluc Sensor (FREESTYLE LIBRE 2 SENSOR) MISC Use to monitor glucose continuously   ezetimibe (ZETIA) 10 MG tablet Take 1 tablet (10 mg total) by mouth daily.   furosemide (LASIX) 20 MG tablet Take 1 tablet (20 mg total) by mouth daily.   insulin glargine, 1 Unit Dial, (TOUJEO) 300 UNIT/ML Solostar Pen Inject 16 Units into the skin at bedtime.   Insulin Pen Needle (PEN NEEDLES) 30G X 5 MM MISC 1 pen by Does not apply route 4 (four) times daily - after meals and at bedtime.   NOVOLOG FLEXPEN 100 UNIT/ML FlexPen Inject 4-10 Units into the skin 3 (three) times daily with meals.   spironolactone (ALDACTONE) 25 MG tablet Take 25 mg by mouth daily.   [DISCONTINUED] atorvastatin (LIPITOR) 80 MG  tablet Take 1 tablet (80 mg total) by mouth daily.     Allergies:   Patient has no known allergies.   Social History   Socioeconomic History   Marital status: Married    Spouse name: Not on file   Number of children: Not on file   Years of education: Not on file   Highest education level: Not on file  Occupational History   Not on file  Tobacco Use   Smoking status: Never   Smokeless tobacco: Never  Vaping Use   Vaping Use: Never used  Substance and Sexual Activity   Alcohol use: Not Currently   Drug use: Not Currently   Sexual activity: Not Currently  Other Topics Concern   Not on file  Social History Narrative   Not on file   Social Determinants of Health   Financial Resource Strain: Not on file  Food Insecurity: Not on file  Transportation Needs: Not on file  Physical Activity: Not on file  Stress: Not on file  Social Connections: Not on file     Family History: The patient's family history includes Congestive Heart Failure in his father; Coronary artery disease (age of onset: 2) in his father; Diabetes in his brother and mother; Diabetes type II in his father; Heart attack (age of onset: 66) in his father; Heart failure (age of onset: 65) in his father; Hypertension in his father and mother.  EKGs/Labs/Other Studies Reviewed:    The following studies were reviewed today:   Cardiac Studies & Procedures   CARDIAC CATHETERIZATION  CARDIAC CATHETERIZATION 03/11/2021  Narrative   Prox RCA lesion is 90% stenosed.-Small to moderate caliber nondominant vessel (<2 mm)   Otherwise minimal diffuse CAD and a Left Dominant System.   LV end diastolic pressure is normal.   There is no aortic valve stenosis.  SUMMARY Severe Single Vessel CAD with ~90% mid lesion in a small-moderate caliber, non-dominant RCA (lesion is in a ~1.5 mm segment) - NOT PCI AMENABLE; Not the cause of Cardiomyopathy Normal LVEDP; Systemic hypertension with discrepancy between cuff pressure and  central aortic pressure of roughly 20 mmHg.   RECOMMENDATIONS Okay to discharge home after bedrest I discussed with inpatient pharmacy to look into med assistance with [facility] If unable to get refill of Entresto, he does have prescription for valsartan 160 mg once daily. He will continue taking carvedilol at double his original dose (carvedilol 12.5 mg twice daily -> new prescription written.).    Bryan Lemma, M.D., M.S. Interventional Cardiologist  Pager # [phone] Phone # [phone] [address] [place], [place] [address]  Findings Coronary Findings Diagnostic  Dominance: Left  Left Main Vessel was injected. Vessel is large.  Left Anterior  Descending The vessel exhibits minimal luminal irregularities. The vessel is tortuous.  First Diagonal Branch The vessel exhibits minimal luminal irregularities.  Second Septal Branch The vessel exhibits minimal luminal irregularities.  Ramus Intermedius Vessel is large. The vessel exhibits minimal luminal irregularities.  Left Circumflex Vessel is large. The vessel exhibits minimal luminal irregularities.  Second Obtuse Marginal Branch Vessel is small in size.  Left Posterior Descending Artery Vessel is moderate in size.  First Left Posterolateral Branch Vessel is small in size.  Left Posterior Atrioventricular Artery Vessel is large in size. The vessel exhibits minimal luminal irregularities.  Right Coronary Artery Vessel was visualized by non-selective angiography. Vessel is small. Small-moderate size vessel Prox RCA lesion is 90% stenosed. The lesion is located proximal to the major branch and focal. At this point, the vessel is &lt;2 mm in diameter-&gt; not PCI target  Acute Marginal Branch Vessel is small in size.  Right Ventricular Branch Vessel is small in size.  Intervention  No interventions have been documented.     ECHOCARDIOGRAM  ECHOCARDIOGRAM LIMITED BUBBLE STUDY  02/07/2021  Narrative ECHOCARDIOGRAM LIMITED REPORT    Patient Name:   IGOR OQUIN Date of Exam: 02/07/2021 Medical Rec #:  [id number]      Height:       68.0 in Accession #:    [id number]     Weight:       224.2 lb Date of Birth:  10-03-1960       BSA:          2.145 m Patient Age:    60 years       BP:           130/78 mmHg Patient Gender: M              HR:           93 bpm. Exam Location:  [address]  Procedure: Limited Echo, Limited Color Doppler and Saline Contrast Bubble Study  Indications:    Q21.12 PFO. LIMITED BUBBLE STUDY to rule out PFO. Last echo performed 12/21/2020.  History:        Patient has prior history of Echocardiogram examinations, most recent 12/21/2020. Cardiomyopathy, Stroke; Risk Factors:Hypertension, Diabetes and Dyslipidemia. PFO.  Sonographer:    Jorje Guild BS, RDCS Referring Phys: [phone] DAVID W HARDING  IMPRESSIONS   1. Left ventricular ejection fraction, by estimation, is 30 to 35%. The left ventricle has moderately decreased function. 2. Agitated saline contrast bubble study was negative, with no evidence of any interatrial shunt.  Comparison(s): 12/21/20 EF 30-30%.  FINDINGS Left Ventricle: Left ventricular ejection fraction, by estimation, is 30 to 35%. The left ventricle has moderately decreased function.  IAS/Shunts: No atrial level shunt detected by color flow Doppler. Agitated saline contrast was given intravenously to evaluate for intracardiac shunting. Agitated saline contrast bubble study was negative, with no evidence of any interatrial shunt.  Riley Lam MD Electronically signed by Riley Lam MD Signature Date/Time: 02/07/2021/2:53:39 PM    Final     CT SCANS  CT CORONARY MORPH W/CTA COR W/SCORE 01/31/2021  Addendum 01/31/2021 10:48 AM ADDENDUM REPORT: 01/31/2021 10:46  CLINICAL DATA:  62 Year old White Male  EXAM: Cardiac/Coronary  CTA  TECHNIQUE: The patient was scanned on a [employer].  FINDINGS: Scan was triggered in the descending thoracic aorta. Axial non-contrast 3 mm slices were carried out through the heart. The data set was analyzed on a dedicated work station and scored using the Agatson method.  Gantry rotation speed was 250 msecs and collimation was .6 mm. 0.8 mg of sl NTG was given. The 3D data set was reconstructed in 5% intervals of the 67-82 % of the R-R cycle. Diastolic phases were analyzed on a dedicated work station using MPR, MIP and VRT modes. The patient received 95 cc of contrast.  Aorta:  Normal size.  Aortic atherosclerosis.  No dissection.  Main Pulmonary Artery: Normal size of the pulmonary artery.  Aortic Valve:  Tri-leaflet.  No calcifications.  Coronary Arteries:  Normal coronary origin.  Co-dominance.  Coronary Calcium Score:  Left main: 0  Left anterior descending artery: 0  Left circumflex artery: 40  Right coronary artery: 0  Total: 40  Percentile: 49th for age, sex, and race matched control.  RCA is a large co-dominant artery that gives rise to PDA and PLA. There is a moderate obstructive soft plaque in the mid and distal vessel.  Left main is a large artery that gives rise to LAD and LCX arteries. There is no significant plaque.  LAD is a large vessel that gives rise to one small D1 vessel and one large D2 Branch. Minimal soft plaque (1-24%) in the proximal vessel.  LCX is a co-dominant artery that gives rise to one large OM1 branch that bifurcates and supplies a ramus distribution and multiple small OM vessels. Mild mixed plaque (25-49%) in the mid vessel.  Other findings:  Normal pulmonary vein drainage into the left atrium.  Normal left atrial appendage without a thrombus.  Extra-cardiac findings: See attached radiology report for non-cardiac structures.  IMPRESSION: 1. Coronary calcium score of 40. This was 49th percentile for age, sex, and race matched control.  2. Normal coronary origin  with co-dominance.  3. CAD-RADS 3. Moderate stenosis. Consider symptom-guided anti-ischemic pharmacotherapy as well as risk factor modification per guideline directed care. Additional analysis with CT FFR will be submitted  4.  Aortic atherosclerosis.  RECOMMENDATIONS:  Coronary artery calcium (CAC) score is a strong predictor of incident coronary heart disease (CHD) and provides predictive information beyond traditional risk factors. CAC scoring is reasonable to use in the decision to withhold, postpone, or initiate statin therapy in intermediate-risk or selected borderline-risk asymptomatic adults (age 36-75 years and LDL-C >=70 to <190 mg/dL) who do not have diabetes or established atherosclerotic cardiovascular disease (ASCVD).* In intermediate-risk (10-year ASCVD risk >=7.5% to <20%) adults or selected borderline-risk (10-year ASCVD risk >=5% to <7.5%) adults in whom a CAC score is measured for the purpose of making a treatment decision the following recommendations have been made:  If CAC = 0, it is reasonable to withhold statin therapy and reassess in 5 to 10 years, as long as higher risk conditions are absent (diabetes mellitus, family history of premature CHD in first degree relatives (males <55 years; females <65 years), cigarette smoking, LDL >=190 mg/dL or other independent risk factors).  If CAC is 1 to 99, it is reasonable to initiate statin therapy for patients >=49 years of age.  If CAC is >=100 or >=75th percentile, it is reasonable to initiate statin therapy at any age.  Cardiology referral should be considered for patients with CAC scores =400 or >=75th percentile.  *2018 AHA/ACC/AACVPR/AAPA/ABC/ACPM/ADA/AGS/APhA/ASPC/NLA/PCNA Guideline on the Management of Blood Cholesterol: A Report of the American College of Cardiology/American Heart Association Task Force on Clinical Practice Guidelines. J Am Coll Cardiol. 2019;73(24):3168-3209.  Riley Lam, MD   Electronically Signed By: Riley Lam M.D. On: 01/31/2021 10:46  Narrative EXAM: OVER-READ INTERPRETATION  CT CHEST  The following report is an over-read performed by radiologist Dr. Trudie Reed of [facility] on 01/31/2021. This over-read does not include interpretation of cardiac or coronary anatomy or pathology. The coronary calcium score/coronary CTA interpretation by the cardiologist is attached.  COMPARISON:  None.  FINDINGS: Atherosclerotic calcifications in the thoracic aorta. Within the visualized portions of the thorax there are no suspicious appearing pulmonary nodules or masses, there is no acute consolidative airspace disease, no pleural effusions, no pneumothorax and no lymphadenopathy. Visualized portions of the upper abdomen are unremarkable. There are no aggressive appearing lytic or blastic lesions noted in the visualized portions of the skeleton.  IMPRESSION: 1.  Aortic Atherosclerosis (ICD10-I70.0).  Electronically Signed: By: Trudie Reed M.D. On: 01/31/2021 08:57           Recent Labs: 02/03/2022: ALT 33; BUN 10; Creatinine, Ser 0.82; Hemoglobin 12.7; Platelets 192; Potassium 4.2; Sodium 139; TSH 1.920  Recent Lipid Panel    Component Value Date/Time   CHOL 124 02/03/2022 0817   TRIG 144 02/03/2022 0817   HDL 34 (L) 02/03/2022 0817   CHOLHDL 3.6 02/03/2022 0817   CHOLHDL 3.7 03/11/2021 0756   VLDL 21 03/11/2021 0756   LDLCALC 65 02/03/2022 0817     Physical Exam:    VS:  BP 120/70   Pulse 77   Ht 5\' 8"  (1.727 m)   Wt 236 lb (107 kg)   SpO2 98%   BMI 35.88 kg/m     Wt Readings from Last 3 Encounters:  07/24/22 236 lb (107 kg)  05/08/22 239 lb 6.4 oz (108.6 kg)  05/08/22 239 lb 1.3 oz (108.4 kg)     GEN: Morbid obesity HEENT: Normal NECK: No JVD CARDIAC: RRR, no murmurs, rubs, gallops RESPIRATORY:  Clear to auscultation without rales, wheezing or rhonchi  ABDOMEN: Soft,  non-tender, non-distended MUSCULOSKELETAL:  Trace bilateral edema; No deformity  SKIN: Warm and dry NEUROLOGIC:  Alert and oriented x 3 PSYCHIATRIC:  Normal affect   ASSESSMENT:    1. Chronic combined systolic and diastolic heart failure (HCC)   2. Hyperlipidemia associated with type 2 diabetes mellitus (HCC)   3. Myasthenia gravis (HCC)   4. Thoracic aortic atherosclerosis (HCC)   5. Essential hypertension   6. Cardiomyopathy, dilated, nonischemic (HCC)   7. Morbid obesity (HCC)   8. OSA (obstructive sleep apnea)   9. Coronary artery disease of native artery of native heart with stable angina pectoris (HCC)    PLAN:     Overall, patient denies cardiac symptoms but worsening myasthenia gravis symptoms.  By repeating an echo and optimizing his medication therapy, we hope to improve his symptoms for both disease states   Heart Failure Reduced Ejection Fraction (combined systolic and diastolic) - chronic - NYHA class II, Stage B, slightly hypervolemic, etiology unclear but non ischemic - Diuretic regimen: Lasix 20 mg PO as PRN - continue MRA 25 mg PO daily - continue coreg for now; based on MG and CAD sx may decrease in favor of ARNI - repeat echo and may up titrate ARN and SGLT2i - CBC and iron studies at next visit  Coronary Artery Disease; Obstructive soft plaque disease non amenable to PCI Morbid obesity - asymptomatic  - continue ASA 81 mg - BB as above - would be GLP-1 Candidate if not improvement in weight with medication optimization  HLD with DM and aortic atherosclerosis Prior history of stroke - Lipids today - stopping statin and starting zetia; may need  BPO on Leqvio  Sleep Apnea - recommend return to sleep clinic and CPAP use  Myasthenia Gravis - statin deferred- possible altered immune response; transition as above to hopefully decrease MG sx - beta blockers  and L type CCB can exacerbated NM transmission; will attempt to wean beta blocker based on  symptoms - Class Ia, should be avoided if at all possible - Will not supplement Magnesium despite HF - there is mixed data on amiodarone, no present need   Fall f/u with our team May need Pharm D if persistently decreased LVEF   Time Spent Directly with Patient:   I have spent a total of 40 minutes with the patient reviewing notes, imaging, EKGs, labs and examining the patient as well as establishing an assessment and plan that was discussed personally with the patient.  > 50% of time was spent in direct patient care and wife (a patient of mine).        Medication Adjustments/Labs and Tests Ordered: Current medicines are reviewed at length with the patient today.  Concerns regarding medicines are outlined above.  Orders Placed This Encounter  Procedures   Lipid panel   ECHOCARDIOGRAM COMPLETE   Meds ordered this encounter  Medications   ezetimibe (ZETIA) 10 MG tablet    Sig: Take 1 tablet (10 mg total) by mouth daily.    Dispense:  90 tablet    Refill:  3   furosemide (LASIX) 20 MG tablet    Sig: Take 1 tablet (20 mg total) by mouth daily.    Dispense:  90 tablet    Refill:  3    Patient Instructions  Medication Instructions:  Your physician has recommended you make the following change in your medication:  STOP: atorvastatin START: ezetimibe (Zetia) 10 mg by mouth once daily START: furosemide (Lasix) 20 mg by mouth once daily as needed for shortness of breath/ swelling   *If you need a refill on your cardiac medications before your next appointment, please call your pharmacy*   Lab Work: FLP  If you have labs (blood work) drawn today and your tests are completely normal, you will receive your results only by: MyChart Message (if you have MyChart) OR A paper copy in the mail If you have any lab test that is abnormal or we need to change your treatment, we will call you to review the results.   Testing/Procedures: Your physician has requested that you have an  echocardiogram. Echocardiography is a painless test that uses sound waves to create images of your heart. It provides your doctor with information about the size and shape of your heart and how well your heart's chambers and valves are working. This procedure takes approximately one hour. There are no restrictions for this procedure. Please do NOT wear cologne, perfume, aftershave, or lotions (deodorant is allowed). Please arrive 15 minutes prior to your appointment time.    Follow-Up: At [facility], you and your health needs are our priority.  As part of our continuing mission to provide you with exceptional heart care, we have created designated Provider Care Teams.  These Care Teams include your primary Cardiologist (physician) and Advanced Practice Providers (APPs -  Physician Assistants and Nurse Practitioners) who all work together to provide you with the care you need, when you need it.  We recommend signing up for the patient portal called "MyChart".  Sign up information is provided on this After Visit Summary.  MyChart is used to connect with patients for Virtual  Visits (Telemedicine).  Patients are able to view lab/test results, encounter notes, upcoming appointments, etc.  Non-urgent messages can be sent to your provider as well.   To learn more about what you can do with MyChart, go to [email or web address].    Your next appointment:   3-4 month(s)  Provider:   Riley Lam, MD      Signed, Christell Constant, MD  07/24/2022 12:48 PM    [facility]

## 2022-07-24 ENCOUNTER — Ambulatory Visit: Payer: 59 | Attending: Internal Medicine | Admitting: Internal Medicine

## 2022-07-24 ENCOUNTER — Encounter: Payer: Self-pay | Admitting: Internal Medicine

## 2022-07-24 VITALS — BP 120/70 | HR 77 | Ht 68.0 in | Wt 236.0 lb

## 2022-07-24 DIAGNOSIS — I25118 Atherosclerotic heart disease of native coronary artery with other forms of angina pectoris: Secondary | ICD-10-CM | POA: Insufficient documentation

## 2022-07-24 DIAGNOSIS — Z794 Long term (current) use of insulin: Secondary | ICD-10-CM

## 2022-07-24 DIAGNOSIS — I10 Essential (primary) hypertension: Secondary | ICD-10-CM

## 2022-07-24 DIAGNOSIS — G4733 Obstructive sleep apnea (adult) (pediatric): Secondary | ICD-10-CM | POA: Diagnosis not present

## 2022-07-24 DIAGNOSIS — I700 Atherosclerosis of aorta: Secondary | ICD-10-CM

## 2022-07-24 DIAGNOSIS — E785 Hyperlipidemia, unspecified: Secondary | ICD-10-CM

## 2022-07-24 DIAGNOSIS — I5042 Chronic combined systolic (congestive) and diastolic (congestive) heart failure: Secondary | ICD-10-CM

## 2022-07-24 DIAGNOSIS — I420 Dilated cardiomyopathy: Secondary | ICD-10-CM | POA: Diagnosis not present

## 2022-07-24 DIAGNOSIS — G7000 Myasthenia gravis without (acute) exacerbation: Secondary | ICD-10-CM | POA: Diagnosis not present

## 2022-07-24 DIAGNOSIS — E1169 Type 2 diabetes mellitus with other specified complication: Secondary | ICD-10-CM | POA: Diagnosis not present

## 2022-07-24 LAB — LIPID PANEL
Chol/HDL Ratio: 4.2 ratio (ref 0.0–5.0)
Cholesterol, Total: 134 mg/dL (ref 100–199)
HDL: 32 mg/dL — ABNORMAL LOW (ref >39)
LDL Chol Calc (NIH): 61 mg/dL (ref 0–99)
Triglycerides: 257 mg/dL — ABNORMAL HIGH (ref 0–149)
VLDL Cholesterol Cal: 41 mg/dL — ABNORMAL HIGH (ref 5–40)

## 2022-07-24 MED ORDER — EZETIMIBE 10 MG PO TABS: 10.0000 mg | ORAL_TABLET | Freq: Every day | ORAL | 3 refills | Status: DC

## 2022-07-24 MED ORDER — FUROSEMIDE 20 MG PO TABS: 20.0000 mg | ORAL_TABLET | Freq: Every day | ORAL | 3 refills | Status: DC

## 2022-07-24 NOTE — Patient Instructions (Signed)
Medication Instructions:  Your physician has recommended you make the following change in your medication:  STOP: atorvastatin START: ezetimibe (Zetia) 10 mg by mouth once daily START: furosemide (Lasix) 20 mg by mouth once daily as needed for shortness of breath/ swelling   *If you need a refill on your cardiac medications before your next appointment, please call your pharmacy*   Lab Work: FLP  If you have labs (blood work) drawn today and your tests are completely normal, you will receive your results only by: MyChart Message (if you have MyChart) OR A paper copy in the mail If you have any lab test that is abnormal or we need to change your treatment, we will call you to review the results.   Testing/Procedures: Your physician has requested that you have an echocardiogram. Echocardiography is a painless test that uses sound waves to create images of your heart. It provides your doctor with information about the size and shape of your heart and how well your heart's chambers and valves are working. This procedure takes approximately one hour. There are no restrictions for this procedure. Please do NOT wear cologne, perfume, aftershave, or lotions (deodorant is allowed). Please arrive 15 minutes prior to your appointment time.    Follow-Up: At [facility], you and your health needs are our priority.  As part of our continuing mission to provide you with exceptional heart care, we have created designated Provider Care Teams.  These Care Teams include your primary Cardiologist (physician) and Advanced Practice Providers (APPs -  Physician Assistants and Nurse Practitioners) who all work together to provide you with the care you need, when you need it.  We recommend signing up for the patient portal called "MyChart".  Sign up information is provided on this After Visit Summary.  MyChart is used to connect with patients for Virtual Visits (Telemedicine).  Patients are able to view  lab/test results, encounter notes, upcoming appointments, etc.  Non-urgent messages can be sent to your provider as well.   To learn more about what you can do with MyChart, go to [email or web address].    Your next appointment:   3-4 month(s)  Provider:   Riley Lam, MD

## 2022-07-28 DIAGNOSIS — G7000 Myasthenia gravis without (acute) exacerbation: Secondary | ICD-10-CM | POA: Diagnosis not present

## 2022-08-04 DIAGNOSIS — G7000 Myasthenia gravis without (acute) exacerbation: Secondary | ICD-10-CM | POA: Diagnosis not present

## 2022-08-08 DIAGNOSIS — Z1159 Encounter for screening for other viral diseases: Secondary | ICD-10-CM | POA: Diagnosis not present

## 2022-08-08 DIAGNOSIS — E559 Vitamin D deficiency, unspecified: Secondary | ICD-10-CM | POA: Diagnosis not present

## 2022-08-08 DIAGNOSIS — E7849 Other hyperlipidemia: Secondary | ICD-10-CM | POA: Diagnosis not present

## 2022-08-08 DIAGNOSIS — E0789 Other specified disorders of thyroid: Secondary | ICD-10-CM | POA: Diagnosis not present

## 2022-08-08 DIAGNOSIS — E1100 Type 2 diabetes mellitus with hyperosmolarity without nonketotic hyperglycemic-hyperosmolar coma (NKHHC): Secondary | ICD-10-CM | POA: Diagnosis not present

## 2022-08-09 ENCOUNTER — Other Ambulatory Visit: Payer: Self-pay | Admitting: Family Medicine

## 2022-08-09 DIAGNOSIS — E559 Vitamin D deficiency, unspecified: Secondary | ICD-10-CM

## 2022-08-09 MED ORDER — VITAMIN D (ERGOCALCIFEROL) 1.25 MG (50000 UNIT) PO CAPS
50000.0000 [IU] | ORAL_CAPSULE | ORAL | 1 refills | Status: DC
Start: 2022-08-09 — End: 2023-05-29

## 2022-08-09 NOTE — Progress Notes (Signed)
Please inform patient that weekly vitamin D supplement sent to his pharmacy to start taking.  His vitamin D is slightly low.  His hemoglobin A1c has increased from 7.6 to 8.0.  I recommend follow-up with his endocrinologist as scheduled. I recommend avoiding simple carbohydrates, including cakes, sweet desserts, ice cream, soda (diet or regular), sweet tea, candies, chips, cookies, store-bought juices, alcohol in excess of 1-2 drinks a day, lemonade, artificial sweeteners, donuts, coffee creamers, and sugar-free products.  I recommend avoiding greasy, fatty foods with increased physical activity.   His triglyceride cholesterol is slightly elevated I recommend that he continue once his current treatment regimen and to take otc fish oil 2000 mg twice daily.  All other labs are stable

## 2022-08-10 LAB — CBC WITH DIFFERENTIAL/PLATELET
Basophils Absolute: 0 10*3/uL (ref 0.0–0.2)
Basos: 1 %
EOS (ABSOLUTE): 0.2 10*3/uL (ref 0.0–0.4)
Eos: 4 %
Hematocrit: 37.2 % — ABNORMAL LOW (ref 37.5–51.0)
Hemoglobin: 12.1 g/dL — ABNORMAL LOW (ref 13.0–17.7)
Immature Grans (Abs): 0 10*3/uL (ref 0.0–0.1)
Immature Granulocytes: 0 %
Lymphocytes Absolute: 1.7 10*3/uL (ref 0.7–3.1)
Lymphs: 30 %
MCH: 28.8 pg (ref 26.6–33.0)
MCHC: 32.5 g/dL (ref 31.5–35.7)
MCV: 89 fL (ref 79–97)
Monocytes Absolute: 0.4 10*3/uL (ref 0.1–0.9)
Monocytes: 7 %
Neutrophils Absolute: 3.4 10*3/uL (ref 1.4–7.0)
Neutrophils: 58 %
Platelets: 179 10*3/uL (ref 150–450)
RBC: 4.2 x10E6/uL (ref 4.14–5.80)
RDW: 13.7 % (ref 11.6–15.4)
WBC: 5.8 10*3/uL (ref 3.4–10.8)

## 2022-08-10 LAB — LIPID PANEL
Chol/HDL Ratio: 4.1 ratio (ref 0.0–5.0)
Cholesterol, Total: 118 mg/dL (ref 100–199)
HDL: 29 mg/dL — ABNORMAL LOW (ref >39)
LDL Chol Calc (NIH): 49 mg/dL (ref 0–99)
Triglycerides: 257 mg/dL — ABNORMAL HIGH (ref 0–149)
VLDL Cholesterol Cal: 40 mg/dL (ref 5–40)

## 2022-08-10 LAB — CMP14+EGFR
ALT: 19 IU/L (ref 0–44)
AST: 16 IU/L (ref 0–40)
Albumin: 4.4 g/dL (ref 3.9–4.9)
Alkaline Phosphatase: 94 IU/L (ref 44–121)
BUN/Creatinine Ratio: 23 (ref 10–24)
BUN: 19 mg/dL (ref 8–27)
Bilirubin Total: 0.3 mg/dL (ref 0.0–1.2)
CO2: 20 mmol/L (ref 20–29)
Calcium: 8.6 mg/dL (ref 8.6–10.2)
Chloride: 104 mmol/L (ref 96–106)
Creatinine, Ser: 0.81 mg/dL (ref 0.76–1.27)
Globulin, Total: 1.8 g/dL (ref 1.5–4.5)
Glucose: 158 mg/dL — ABNORMAL HIGH (ref 70–99)
Potassium: 4.5 mmol/L (ref 3.5–5.2)
Sodium: 140 mmol/L (ref 134–144)
Total Protein: 6.2 g/dL (ref 6.0–8.5)
eGFR: 98 mL/min/{1.73_m2} (ref >59)

## 2022-08-10 LAB — VITAMIN D 25 HYDROXY (VIT D DEFICIENCY, FRACTURES): Vit D, 25-Hydroxy: 26.5 ng/mL — ABNORMAL LOW (ref 30.0–100.0)

## 2022-08-10 LAB — MICROALBUMIN / CREATININE URINE RATIO
Creatinine, Urine: 100.2 mg/dL
Microalb/Creat Ratio: 10 mg/g creat (ref 0–29)
Microalbumin, Urine: 10.2 ug/mL

## 2022-08-10 LAB — HEPATITIS C ANTIBODY: Hep C Virus Ab: NONREACTIVE

## 2022-08-10 LAB — TSH: TSH: 1.62 u[IU]/mL (ref 0.450–4.500)

## 2022-08-10 LAB — HEMOGLOBIN A1C
Est. average glucose Bld gHb Est-mCnc: 183 mg/dL
Hgb A1c MFr Bld: 8 % — ABNORMAL HIGH (ref 4.8–5.6)

## 2022-08-11 DIAGNOSIS — G7000 Myasthenia gravis without (acute) exacerbation: Secondary | ICD-10-CM | POA: Diagnosis not present

## 2022-08-12 ENCOUNTER — Encounter: Payer: Self-pay | Admitting: Internal Medicine

## 2022-08-12 ENCOUNTER — Ambulatory Visit (INDEPENDENT_AMBULATORY_CARE_PROVIDER_SITE_OTHER): Payer: 59 | Admitting: Internal Medicine

## 2022-08-12 VITALS — BP 164/66 | HR 82 | Ht 68.0 in | Wt 236.2 lb

## 2022-08-12 DIAGNOSIS — I5042 Chronic combined systolic (congestive) and diastolic (congestive) heart failure: Secondary | ICD-10-CM

## 2022-08-12 DIAGNOSIS — I10 Essential (primary) hypertension: Secondary | ICD-10-CM

## 2022-08-12 DIAGNOSIS — Z1211 Encounter for screening for malignant neoplasm of colon: Secondary | ICD-10-CM

## 2022-08-12 DIAGNOSIS — I420 Dilated cardiomyopathy: Secondary | ICD-10-CM | POA: Diagnosis not present

## 2022-08-12 MED ORDER — LOSARTAN POTASSIUM 25 MG PO TABS
25.0000 mg | ORAL_TABLET | Freq: Every day | ORAL | 2 refills | Status: DC
Start: 2022-08-12 — End: 2022-09-09

## 2022-08-12 NOTE — Patient Instructions (Signed)
It was a pleasure to see you today.  Thank you for giving [place] the opportunity to be involved in your care.  Below is a brief recap of your visit and next steps.  We will plan to see you again in 1 month.  Summary Start losartan 25 mg daily No additional medication changes Follow up in 4 weeks for BP check

## 2022-08-12 NOTE — Progress Notes (Signed)
Established Patient Office Visit  Subjective   Patient ID: Tony Combs, male    DOB: 1960-07-13  Age: 62 y.o. MRN: [id number]  Chief Complaint  Patient presents with   Diabetes    Follow up   [name] returns to care today for 2-month follow-up.  He was last evaluated by his PCP on 3/21.  His blood pressure was elevated at that time.  He was instructed to take spironolactone 12.5 mg twice daily.  1 month follow-up was arranged for HTN check.  In the interim he has established care with cardiology earlier this month (6/6).  [name] reports feeling well today.  He is asymptomatic and has no additional concerns to discuss.  Past Medical History:  Diagnosis Date   CAD in native artery 03/11/2021   Small Non-dominant RCA - prox 90% (CTA FFR 0.66) - Not favorable for PCI - small caliber, non-dominant (<2 mm) vessel.  Mininal diffuse disease in LCx.   Diabetes mellitus without complication (HCC)    Dilated cardiomyopathy (HCC) 01/17/2021   In setting of stroke: TTE 08/31/2020: EF 30 and 35%.  Mildly decreased function.  Moderate LVH.  Unable to assess diastolic function.  Unable assess RV function.  Mild LA dilation.   Hyperlipidemia    Hypertension    Myasthenia gravis (HCC) 03/01/2021   Followed by Dr. 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   Past Surgical History:  Procedure Laterality Date   Coronary CT Angiogram  01/31/2021   Coronary calcium score 40.  No CAD RADS 3.  Moderate stenosis.  RCA has moderate obstructive plaque in the mid and distal vessel.  Otherwise 25-49% OM1. => CT FFR positive, 0.66 in the RCA.   HERNIA REPAIR     LEFT HEART CATH AND CORONARY ANGIOGRAPHY N/A 03/11/2021   Procedure: LEFT HEART CATH AND CORONARY ANGIOGRAPHY;  Surgeon: Tony Lex, MD;  Location: [facility] INVASIVE CV LAB;;   Prox RCA lesion is 90% stenosed.-Small to moderate caliber nondominant vessel (<2 mm) => not PCI  amenable.  Not the cause of cardiomyopathy.   Otherwise minimal diffuse CAD and a Left Dominant System.   LV EDP is normal with Systemic Hypertension (Central Aop ~20 mmHg > BP Cuff)   TRANSTHORACIC ECHOCARDIOGRAM  08/31/2020   a) EF 30 and 35%.  Mildly decreased function.  Moderate LVH.  Unable to assess diastolic function.  Unable assess RV function.  Mild LA dilation.; b) 12/21/2020: EF remains 30 to 35% with moderately reduced function.  Global HK.  GR 1 DD.  Normal RV size and function.  Normal aortic valves.  Cannot exclude small PFO. => 02/07/21 -> NEGATIVE BUBBLE STUDY.   Social History   Tobacco Use   Smoking status: Never   Smokeless tobacco: Never  Vaping Use   Vaping Use: Never used  Substance Use Topics   Alcohol use: Not Currently   Drug use: Not Currently   Family History  Problem Relation Age of Onset   Hypertension Mother    Diabetes Mother    Heart failure Father 46   Heart attack Father 86   Hypertension Father    Diabetes type II Father    Coronary artery disease Father 85   Congestive Heart Failure Father    Diabetes Brother    No Known Allergies  Review of Systems  Constitutional:  Negative for chills and fever.  HENT:  Negative for sore  throat.   Respiratory:  Negative for cough and shortness of breath.   Cardiovascular:  Negative for chest pain, palpitations and leg swelling.  Gastrointestinal:  Negative for abdominal pain, blood in stool, constipation, diarrhea, nausea and vomiting.  Genitourinary:  Negative for dysuria and hematuria.  Musculoskeletal:  Negative for myalgias.  Skin:  Negative for itching and rash.  Neurological:  Negative for dizziness and headaches.  Psychiatric/Behavioral:  Negative for depression and suicidal ideas.      Objective:     BP (!) 164/66   Pulse 82   Ht 5\' 8"  (1.727 m)   Wt 236 lb 3.2 oz (107.1 kg)   SpO2 97%   BMI 35.91 kg/m  BP Readings from Last 3 Encounters:  08/12/22 (!) 164/66  07/24/22 120/70   05/08/22 (!) 140/80   Physical Exam Vitals reviewed.  Constitutional:      General: He is not in acute distress.    Appearance: Normal appearance. He is not ill-appearing.  HENT:     Head: Normocephalic and atraumatic.     Right Ear: External ear normal.     Left Ear: External ear normal.     Nose: Nose normal. No congestion or rhinorrhea.     Mouth/Throat:     Mouth: Mucous membranes are moist.     Pharynx: Oropharynx is clear.  Eyes:     General: No scleral icterus.    Extraocular Movements: Extraocular movements intact.     Conjunctiva/sclera: Conjunctivae normal.     Pupils: Pupils are equal, round, and reactive to light.  Cardiovascular:     Rate and Rhythm: Normal rate and regular rhythm.     Pulses: Normal pulses.     Heart sounds: Normal heart sounds. No murmur heard. Pulmonary:     Effort: Pulmonary effort is normal.     Breath sounds: Normal breath sounds. No wheezing, rhonchi or rales.  Abdominal:     General: Abdomen is flat. Bowel sounds are normal. There is no distension.     Palpations: Abdomen is soft.     Tenderness: There is no abdominal tenderness.  Musculoskeletal:        General: No swelling or deformity. Normal range of motion.     Cervical back: Normal range of motion.  Skin:    General: Skin is warm and dry.     Capillary Refill: Capillary refill takes less than 2 seconds.  Neurological:     General: No focal deficit present.     Mental Status: He is alert and oriented to person, place, and time.     Motor: No weakness.  Psychiatric:        Mood and Affect: Mood normal.        Behavior: Behavior normal.        Thought Content: Thought content normal.   Last CBC Lab Results  Component Value Date   WBC 5.8 08/08/2022   HGB 12.1 (L) 08/08/2022   HCT 37.2 (L) 08/08/2022   MCV 89 08/08/2022   MCH 28.8 08/08/2022   RDW 13.7 08/08/2022   PLT 179 08/08/2022   Last metabolic panel Lab Results  Component Value Date   GLUCOSE 158 (H)  08/08/2022   NA 140 08/08/2022   K 4.5 08/08/2022   CL 104 08/08/2022   CO2 20 08/08/2022   BUN 19 08/08/2022   CREATININE 0.81 08/08/2022   EGFR 98 08/08/2022   CALCIUM 8.6 08/08/2022   PHOS 4.0 09/01/2020   PROT 6.2 08/08/2022  ALBUMIN 4.4 08/08/2022   LABGLOB 1.8 08/08/2022   AGRATIO 1.6 02/03/2022   BILITOT 0.3 08/08/2022   ALKPHOS 94 08/08/2022   AST 16 08/08/2022   ALT 19 08/08/2022   ANIONGAP 5 09/02/2020   Last lipids Lab Results  Component Value Date   CHOL 118 08/08/2022   HDL 29 (L) 08/08/2022   LDLCALC 49 08/08/2022   TRIG 257 (H) 08/08/2022   CHOLHDL 4.1 08/08/2022   Last hemoglobin A1c Lab Results  Component Value Date   HGBA1C 8.0 (H) 08/08/2022   Last thyroid functions Lab Results  Component Value Date   TSH 1.620 08/08/2022   Last vitamin D Lab Results  Component Value Date   VD25OH 26.5 (L) 08/08/2022     Assessment & Plan:   Problem List Items Addressed This Visit       Essential hypertension - Primary (Chronic)    BP is significantly elevated today.  His current antihypertensive regimen includes carvedilol 12.5 mg twice daily and spironolactone 25 mg daily. -Add losartan 25 mg daily -Follow-up in 4 weeks for HTN check      Chronic combined systolic and diastolic heart failure (HCC) (Chronic)    Followed by cardiology.  He was seen for follow-up earlier this month (6/6).  Euvolemic on exam today and asymptomatic. -Repeat TTE is scheduled for 7/3 -He is currently prescribed carvedilol 12.5 mg twice daily, spironolactone 25 mg daily, and Lasix 20 mg daily.  Previously on ARNI but reports today that it is currently cost prohibitive. -Start losartan 25 mg daily.  I have recommended that he discuss resuming Entresto with his cardiologist at which time losartan can be discontinued      Colon cancer screening    Cologuard ordered today       Return in about 4 weeks (around 09/09/2022) for HTN.    Billie Lade, MD

## 2022-08-14 ENCOUNTER — Telehealth: Payer: Self-pay | Admitting: Neurology

## 2022-08-14 NOTE — Telephone Encounter (Signed)
Scheduled pt on 08/25/22 at 4pm

## 2022-08-14 NOTE — Telephone Encounter (Signed)
Dr Pearlean Brownie has requested to work the patient in for a follow up to determine how medication is working. I have created a 4 pm slot on July 8th with check in of 3:30 pm.

## 2022-08-14 NOTE — Telephone Encounter (Signed)
Called pt. LVM to please call office to schedule appointment.  

## 2022-08-19 ENCOUNTER — Encounter: Payer: Self-pay | Admitting: Internal Medicine

## 2022-08-19 DIAGNOSIS — Z1211 Encounter for screening for malignant neoplasm of colon: Secondary | ICD-10-CM | POA: Insufficient documentation

## 2022-08-19 NOTE — Assessment & Plan Note (Signed)
BP is significantly elevated today.  His current antihypertensive regimen includes carvedilol 12.5 mg twice daily and spironolactone 25 mg daily. -Add losartan 25 mg daily -Follow-up in 4 weeks for HTN check

## 2022-08-19 NOTE — Assessment & Plan Note (Signed)
Followed by cardiology.  He was seen for follow-up earlier this month (6/6).  Euvolemic on exam today and asymptomatic. -Repeat TTE is scheduled for 7/3 -He is currently prescribed carvedilol 12.5 mg twice daily, spironolactone 25 mg daily, and Lasix 20 mg daily.  Previously on ARNI but reports today that it is currently cost prohibitive. -Start losartan 25 mg daily.  I have recommended that he discuss resuming Entresto with his cardiologist at which time losartan can be discontinued

## 2022-08-19 NOTE — Assessment & Plan Note (Signed)
Cologuard ordered today °

## 2022-08-20 ENCOUNTER — Ambulatory Visit (HOSPITAL_COMMUNITY): Payer: 59 | Attending: Internal Medicine

## 2022-08-20 DIAGNOSIS — E785 Hyperlipidemia, unspecified: Secondary | ICD-10-CM | POA: Insufficient documentation

## 2022-08-20 DIAGNOSIS — E1169 Type 2 diabetes mellitus with other specified complication: Secondary | ICD-10-CM | POA: Diagnosis not present

## 2022-08-20 DIAGNOSIS — I5042 Chronic combined systolic (congestive) and diastolic (congestive) heart failure: Secondary | ICD-10-CM | POA: Insufficient documentation

## 2022-08-20 DIAGNOSIS — G7000 Myasthenia gravis without (acute) exacerbation: Secondary | ICD-10-CM | POA: Insufficient documentation

## 2022-08-20 LAB — ECHOCARDIOGRAM COMPLETE
Area-P 1/2: 2.85 cm2
S' Lateral: 3.5 cm

## 2022-08-20 MED ORDER — PERFLUTREN LIPID MICROSPHERE
3.0000 mL | INTRAVENOUS | Status: AC | PRN
Start: 2022-08-20 — End: 2022-08-20
  Administered 2022-08-20: 3 mL via INTRAVENOUS

## 2022-08-25 ENCOUNTER — Ambulatory Visit: Payer: 59 | Admitting: Neurology

## 2022-08-25 ENCOUNTER — Encounter: Payer: Self-pay | Admitting: Neurology

## 2022-08-25 VITALS — BP 140/77 | HR 86 | Ht 68.0 in | Wt 233.8 lb

## 2022-08-25 DIAGNOSIS — G7000 Myasthenia gravis without (acute) exacerbation: Secondary | ICD-10-CM | POA: Diagnosis not present

## 2022-08-25 DIAGNOSIS — H532 Diplopia: Secondary | ICD-10-CM | POA: Diagnosis not present

## 2022-08-25 DIAGNOSIS — R5383 Other fatigue: Secondary | ICD-10-CM

## 2022-08-25 NOTE — Patient Instructions (Addendum)
I had a long discussion with the patient regarding his myasthenia symptoms which had improved with injection of Rystiggo but it was denied by insurance company despite appeal.  He has been not been able to tolerate Mestinon in the past steroids and CellCept are relatively contraindicated due to his diabetes.  He has received first dose of Vyvgart infusion 2 months ago but had only mild improvement.. His MG-ADL score today is 12 with an MGFA class II-!!! fluctuating symptoms.  Will try for repeat Vyvgart if approved by insurance.  Continue aspirin for stroke prevention and maintain aggressive risk factor modification with strict control of lipids with LDL cholesterol goal below 70 mg percent and hypertension with goal below 1.  Good control of diabetes with hemoglobin A1c goal below 6.5%.  Will return for follow-up in the future with nurse practitioner Shanda Bumps f in 2 months or call earlier if necessary.

## 2022-08-25 NOTE — Progress Notes (Signed)
[facility] [address] [place]. [place] [address] [phone]       OFFICE FOLLOW-UP NOTE  [name] Date of Birth:  September 20, 1960 Medical Record Number:  [id number]   HPI:   Last visit 12/06/2020 Tony Combs is a 62 year old Caucasian male seen today for initial office follow-up visit following hospital consultation for stroke in July 2022.  History is obtained from the patient and his wife and review of electronic medical records and I personally reviewed pertinent imaging films in PACS.  He has past medical history of hypertension, hyperlipidemia, diabetes, coronary artery disease who presented to [facility] ED on 08/31/2020 for sudden onset of slurred speech with visual blurring and double vision.  Symptoms began the day prior to admission she went to [facility] where his noncontrast CT scan of the head was done which was unremarkable but before seeing the doctor she left AGAINST MEDICAL ADVICE.  She presented to [facility], ED the next day with complaints of persistent slurred speech with headache and blurred and double vision.  He also noted right side of the face felt weak and tingling.  He also felt off balance and dizzy at work but denies any focal extremity weakness.  MRI scan of the brain showed a 12 mm acute infarct in the anterior medial right frontal subcortical white matter.  Remote age tiny chronic left thalamic infarct was also noted.  MR angiogram study of the brain showed multifocal stenoses.with mild-to-moderate stenosis within the distal A2 segment of the right anterior cerebral artery. Severe stenosis within the left posterior cerebral artery at the P1/P2 junction. and MRA of the neck was unremarkable.  2D echo showed diminished ejection fraction of 30 to 35% but no clot.  LDL cholesterol 61 mg percent and hemoglobin A1c was elevated at 11.7.  Patient was felt to be at high risk for sleep apnea and offered participation in the sleep smart study  for stroke prevention and agreed to participate.  He was randomized to this CPAP treatment for his sleep apnea.  On exam patient was felt to have dysarthric and hypophonic speech with subtle ptosis of his left eye with left pupil being smaller in size in the right and because of his subjective complaints of diplopia, dysarthria and gait ataxia felt to have a small brainstem infarct not visualized on the MRI in addition to which was seen in the right subcortical region which could not explain his symptoms.  He was discharged on aspirin and Plavix for 3 months followed by aspirin alone.  Patient states that is obtained some improvement but his wife still becomes soft and slurred when he is tired.  Is also has some intermittent swallowing difficulties.  He also complains of persistent blurred and double vision particularly towards the end of the day.  His mornings are much better.  His balance is still not right so uses a cane to walk.  He has had no falls or injuries.  His wife has noted that at the end of the day his eyes are not synchronous and the left eye is does not move as much as the right.  Patient is tolerating aspirin well without bruising or bleeding.  His blood pressure is well controlled today it is 129/75.  His sugars are doing better and hemoglobin A1c is now down to 7.  His average blood glucose for the last 10 days is 142.  He remains on Lipitor which is tolerating well without side effects.  He has been compliant with using his CPAP mask which he got through the sleep smart study and feels it is helping him.  Update 02/26/2021 : He returns for follow-up after last visit 2-1/2 months ago.  Is recommended by his wife.  Patient continues to have speech and swallowing difficulties which is more or less unchanged.  In his diet his speech often becomes slurred.  He denies any double vision but wife does notice some drooping eyelids to the end of the day.  He also has trouble swallowing at times.  Patient  did not tolerate Mestinon due to cramps and diarrhea and he takes only 30 mg once a day he is not sure if this is helping him.  He denies any trouble getting out of chair or walking or weakness of proximal shoulder or thigh muscles.  Lab work on 12/06/2020 shows strongly positive acetylcholine receptor binding antibody titer of 3.62 as well as acetylcholine blocking antibody in a titer of 95.  He previously had CT scan of the chest done on 08/30/2020 which did not show any thymoma but showed some mediastinal lymphadenopathy.  Hemoglobin A1c done on 02/19/2021 was elevated at 7.1.  EMG nerve conduction study with rapid repetitive stimulation on 01/24/2021 was positive for decrement confirming diagnosis of myasthenia.  It also showed evidence of moderate right carpal tunnel and right ulnar nerve entrapment at the elbow.  Patient does complain of hand paresthesias and some grip weakness.  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 Update 07/04/2021 ; He returns for follow-up after last visit 4 months ago.  He is accompanied by his exposures.  Patient states he is finished for injections of Vyvgart at 1 weekly intervals on 05/15/2021 and has noticed improvement in his speech and swallowing.  He does occasionally get intermittent diplopia which is transient.  He denies any drooping of eyelids or significant dysphagia or muscle weakness or fatigability.H his speech has remained quite good over the last 1 month despite not having injections.  He had trouble tolerating Mestinon due to diarrhea in the past and is not a good candidate for long-term steroids given his obesity and comorbidities.  He has had no recurrent stroke or TIA symptoms.  He remains on aspirin which is tolerating well without bruising or bleeding and Lipitor without muscle aches and pains.  He states has not been quite regular using his CPAP for his sleep apnea.  His blood pressures under good control today it is 139/73.  His  diabetes is yet suboptimally controlled with last A1c being 8.2.  He has no new complaints.  Update 12/17/2021 Dr. Pearlean Brownie: He returns for follow-up after last visit 5 months ago.  He is accompanied by his wife.  Patient continues to have symptoms of his myasthenia, generalized fatigability and tiredness symptoms have diurnal fluctuation and appears more prominent when he is tired in the meantime.  Eating as well.  He voids also.  Patient has trouble tolerating due to side effects and does not want to take steroids or  CellCept due to his uncontrolled diabetes and medical comorbidities.  He was treated with 2 injections of Vygardt quite substantial improvement in all the symptoms of myasthenia that he was having and this was sustained for around 2 months but his symptoms have returned since then.  He can barely look up without seeing double with his diet to a midday and has trouble doing his meals and his wife today.  Insurance company refused further injections we will try Rystiggo instead at this time.  Continues to do well from stroke standpoint without recurrent stroke or TIA symptoms.  He remains on aspirin which is tolerating well without side effects.  He is tolerating Lipitor well and last lipid profile on 11/08/2021 showed LDL cholesterol to be optimal at 59.  His diabetes remains poorly controlled and last A1c was 8.9.  He is not too compliant with his CPAP for his sleep apnea and I counseled him.   Update 04/09/2022 JM: Patient returns for follow-up accompanied by his wife.  At prior visit, initiated Rystiggo for [facility] but still waiting insurance approval, last update in epic 2/14 still pending. Overall feels about the same since last visit, continues to have fluctuation of generalized fatigability and weakness, can have good days and bad days. Usually does fairly well in the morning and symptoms can worsen in the evening.  Continue to use a cane for ambulation.  Will have occasional double vision  especially when looking from side to side or looking up.  He continues to have swallowing difficulties, believes this is unchanged.  Stable from stroke standpoint without new or reoccurring stroke/TIA symptoms.  Compliant on aspirin and atorvastatin.  Blood pressure well-controlled.  Routinely follows with PCP.  08/25/2022 : Patient returns for follow-up after last visit 4 months ago.  Patient continues to have symptoms of myasthenia.  He has intermittent fatigue, tiredness, drooping of the eyelid, diplopia muscle weakness particularly more towards the evening.  He was denied repeat injections of Rystiggo by insurance company despite appeals we will go ahead and good initial response.  First infusion of Vyvgart instead was 2 months ago but does not seem to help from sustained benefit though he did get some mild benefit initially.  He still gets intermittent diplopia, drooping of the eyelids and tiredness particularly towards the end of the day.  He denies significant dysphagia but does get hoarseness of voice.  He does get short of breath with exertion and occasionally at rest.  He has not had recurrent stroke or TIA symptoms.  Remains on aspirin tolerating well with minor bruising and no bleeding.  States his diabetes is under good control.  However his last hemoglobin A1c on 08/08/2022 was 8.0.  Lipid profile on 08/08/2022 showed LDL cholesterol to be optimal at 49 mg percent..  Myasthenia gravis antibody panel on 04/24/2022 showed slight reduction in acetylcholine binding antibody to 2.27,AcetylCholine blocking antibody 82.   ACH Modulating antibody was elevated at 67.  Anti striation antibodies were elevated at 1 : 32000   ROS:   14 system review of systems is positive for those listed in HPI and all other systems negative  PMH:  Past Medical History:  Diagnosis Date   CAD in native artery 03/11/2021   Small Non-dominant RCA - prox 90% (CTA FFR 0.66) - Not favorable for PCI - small caliber, non-dominant  (<2 mm) vessel.  Mininal diffuse disease in LCx.   Diabetes mellitus without complication (HCC)    Dilated cardiomyopathy (HCC) 01/17/2021   In setting of stroke: TTE 08/31/2020: EF 30 and 35%.  Mildly decreased function.  Moderate LVH.  Unable to assess diastolic function.  Unable assess RV function.  Mild LA dilation.   Hyperlipidemia    Hypertension    Myasthenia gravis (HCC) 03/01/2021   Followed by Dr. 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  Social History:  Social History   Socioeconomic History   Marital status: Married    Spouse name: Not on file   Number of children: Not on file   Years of education: Not on file   Highest education level: Not on file  Occupational History   Not on file  Tobacco Use   Smoking status: Never   Smokeless tobacco: Never  Vaping Use   Vaping Use: Never used  Substance and Sexual Activity   Alcohol use: Not Currently   Drug use: Not Currently   Sexual activity: Not Currently  Other Topics Concern   Not on file  Social History Narrative   Not on file   Social Determinants of Health   Financial Resource Strain: Not on file  Food Insecurity: Not on file  Transportation Needs: Not on file  Physical Activity: Not on file  Stress: Not on file  Social Connections: Not on file  Intimate Partner Violence: Not on file    Medications:   Current Outpatient Medications on File Prior to Visit  Medication Sig Dispense Refill   aspirin EC 81 MG tablet Take 81 mg by mouth in the morning. Swallow whole.     B-D UF III MINI PEN NEEDLES 31G X 5 MM MISC SMARTSIG:1 Pre-Filled Pen Syringe SUB-Q 4 Times Daily     Continuous Blood Gluc Sensor (FREESTYLE LIBRE 2 SENSOR) MISC Use to monitor glucose continuously 6 each 3   ezetimibe (ZETIA) 10 MG tablet Take 1 tablet (10 mg total) by mouth daily. 90 tablet 3   furosemide (LASIX) 20 MG tablet Take 1 tablet (20 mg total) by  mouth daily. 90 tablet 3   insulin glargine, 1 Unit Dial, (TOUJEO) 300 UNIT/ML Solostar Pen Inject 16 Units into the skin at bedtime. 18 mL 3   Insulin Pen Needle (PEN NEEDLES) 30G X 5 MM MISC 1 pen by Does not apply route 4 (four) times daily - after meals and at bedtime. 200 each 1   losartan (COZAAR) 25 MG tablet Take 1 tablet (25 mg total) by mouth daily. 30 tablet 2   NOVOLOG FLEXPEN 100 UNIT/ML FlexPen Inject 4-10 Units into the skin 3 (three) times daily with meals. 15 mL 3   spironolactone (ALDACTONE) 25 MG tablet Take 25 mg by mouth daily.     Vitamin D, Ergocalciferol, (DRISDOL) 1.25 MG (50000 UNIT) CAPS capsule Take 1 capsule (50,000 Units total) by mouth every 7 (seven) days. 20 capsule 1   carvedilol (COREG) 6.25 MG tablet Take 2 tablets (12.5 mg total) by mouth 2 (two) times daily with a meal. 120 tablet 0   Current Facility-Administered Medications on File Prior to Visit  Medication Dose Route Frequency Provider Last Rate Last Admin   sodium chloride flush (NS) 0.9 % injection 10 mL  10 mL Intravenous PRN [name], MD   20 mL at 02/07/21 0925    Allergies:  No Known Allergies  Physical Exam Today's Vitals   08/25/22 1541  BP: (!) 140/77  Pulse: 86  Weight: 106.1 kg  Height: 5\' 8"  (1.727 m)   Body mass index is 35.55 kg/m.   General: Obese middle-aged Caucasian male, seated, in no evident distress Head: head normocephalic and atraumatic.  Neck: supple with no carotid or supraclavicular bruits Cardiovascular: regular rate and rhythm, no murmurs Musculoskeletal: no deformity Skin:  no rash/petichiae Vascular:  Normal pulses all extremities  Neurologic Exam Mental Status: Awake and fully alert. Oriented to place and time. Recent and  remote memory intact. Attention span, concentration and fund of knowledge appropriate. Mood and affect appropriate.  Voice seems normal today.  No dysarthria. Cranial Nerves: Pupils equal, briskly reactive to light. Extraocular  movements full without nystagmus. Visual fields full to confrontation. Hearing intact. Facial sensation intact. Face, tongue, palate moves normally and symmetrically.  Mild drooping of both eyelids on sustained upgaze greater than 30 seconds left more than right.  Light hypotropia of the left eye looking.  Develops subjective diplopia and no ophthalmoplegia. Motor: Normal bulk and tone. Normal strength in all tested extremity muscles.  Subjective fatigability  when arms outstretched more than 30 seconds Sensory.: intact to touch ,pinprick .position and vibratory sensation.  Coordination: Rapid alternating movements normal in all extremities. Finger-to-nose and heel-to-shin performed accurately bilaterally. Gait and Station: Arises from chair without difficulty. Stance is normal. Gait demonstrates good stride length with decreased step height bilaterally and mild unsteadiness with use of cane.  Tandem walk and heel toe not attempted. Reflexes: 1+ and symmetric. Toes downgoing.    MG classification- fluctuates between class 2-3 MG ADL score 12    ASSESSMENT/PLAN: 62 year old Caucasian male with sudden onset of slurred speech, diplopia and dysphagia in July 2022 likely from small brainstem infarct not visualized on MRI from small vessel disease.  MRI at that time had shown also right periventricular white matter infarct which was probably silent.  Multiple vascular risk factors of hypertension, diabetes, hyperlipidemia obesity and sleep apnea.  Patient has been randomized to the CPAP treatment arm of the sleep smart study.   Ongoing symptoms of increased fatigability tiredness and worsening speech and swallowing from antibody positive  myasthenia which responded well to Rystiggo infusions but insurance company has refused further injections therefore transitioned to [employer] .  He continues to have positive acetylcholine antibodies    I had a long discussion with the patient regarding his myasthenia  symptoms which had improved with injection of Rystiggo but it was denied by insurance company despite appeal.  He has been not been able to tolerate Mestinon in the past steroids and CellCept are relatively contraindicated due to his diabetes.  He has received first dose of Vyvgart infusion 2 months ago but had only mild improvement.. His MG-ADL score today is 12 with an MGFA class II-!!! fluctuating symptoms.  Will try for repeat Vyvgart if approved by insurance.  Continue aspirin for stroke prevention and maintain aggressive risk factor modification with strict control of lipids with LDL cholesterol goal below 70 mg percent and hypertension with goal below 1.  Good control of diabetes with hemoglobin A1c goal below 6.5%.  Will return for follow-up in the future with nurse practitioner Shanda Bumps f in 2 months or call earlier if necessary    I spent 35 minutes of face-to-face and non-face-to-face time with patient and discussed about hismyasthenia and stroke and answered questions.  This included previsit chart review, lab review, study review, order entry, electronic health record documentation, patient and wife education and discussion regarding above diagnoses and treatment plan and answered all other questions to patients satisfaction   Delia Heady, MD [facility] [address] [place], [place] [address]  Phone [phone] Fax [phone] Note: This document was prepared with digital dictation and possible smart phrase technology. Any transcriptional errors that result from this process are unintentional.

## 2022-09-01 DIAGNOSIS — Z1211 Encounter for screening for malignant neoplasm of colon: Secondary | ICD-10-CM | POA: Diagnosis not present

## 2022-09-08 LAB — COLOGUARD: COLOGUARD: NEGATIVE

## 2022-09-09 ENCOUNTER — Encounter: Payer: Self-pay | Admitting: Nurse Practitioner

## 2022-09-09 ENCOUNTER — Encounter: Payer: Self-pay | Admitting: Internal Medicine

## 2022-09-09 ENCOUNTER — Ambulatory Visit (INDEPENDENT_AMBULATORY_CARE_PROVIDER_SITE_OTHER): Payer: 59 | Admitting: Internal Medicine

## 2022-09-09 ENCOUNTER — Ambulatory Visit: Payer: 59 | Admitting: Nurse Practitioner

## 2022-09-09 VITALS — BP 139/76 | HR 84 | Ht 68.0 in | Wt 235.0 lb

## 2022-09-09 VITALS — BP 142/80 | HR 86 | Ht 68.0 in | Wt 235.2 lb

## 2022-09-09 DIAGNOSIS — I420 Dilated cardiomyopathy: Secondary | ICD-10-CM | POA: Diagnosis not present

## 2022-09-09 DIAGNOSIS — Z794 Long term (current) use of insulin: Secondary | ICD-10-CM

## 2022-09-09 DIAGNOSIS — I10 Essential (primary) hypertension: Secondary | ICD-10-CM

## 2022-09-09 DIAGNOSIS — E782 Mixed hyperlipidemia: Secondary | ICD-10-CM

## 2022-09-09 DIAGNOSIS — E1165 Type 2 diabetes mellitus with hyperglycemia: Secondary | ICD-10-CM

## 2022-09-09 MED ORDER — LOSARTAN POTASSIUM 50 MG PO TABS
50.0000 mg | ORAL_TABLET | Freq: Every day | ORAL | 2 refills | Status: DC
Start: 2022-09-09 — End: 2022-10-02

## 2022-09-09 NOTE — Progress Notes (Signed)
Endocrinology Follow Up Note       09/09/2022, 12:43 PM   Subjective:    Patient ID: Tony Combs, male    DOB: May 07, 1960.  Tony Combs is being seen in follow up after being seen in consultation for management of currently uncontrolled symptomatic diabetes requested by  Tony Lade, Tony Combs.   Past Medical History:  Diagnosis Date   CAD in native artery 03/11/2021   Small Non-dominant RCA - prox 90% (CTA FFR 0.66) - Not favorable for PCI - small caliber, non-dominant (<2 mm) vessel.  Mininal diffuse disease in LCx.   Diabetes mellitus without complication (HCC)    Dilated cardiomyopathy (HCC) 01/17/2021   In setting of stroke: TTE 08/31/2020: EF 30 and 35%.  Mildly decreased function.  Moderate LVH.  Unable to assess diastolic function.  Unable assess RV function.  Mild LA dilation.   Hyperlipidemia    Hypertension    Myasthenia gravis (HCC) 03/01/2021   Followed by Dr. 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    Past Surgical History:  Procedure Laterality Date   Coronary CT Angiogram  01/31/2021   Coronary calcium score 40.  No CAD RADS 3.  Moderate stenosis.  RCA has moderate obstructive plaque in the mid and distal vessel.  Otherwise 25-49% OM1. => CT FFR positive, 0.66 in the RCA.   HERNIA REPAIR     LEFT HEART CATH AND CORONARY ANGIOGRAPHY N/A 03/11/2021   Procedure: LEFT HEART CATH AND CORONARY ANGIOGRAPHY;  Surgeon: [name], Tony Combs;  Location: [facility] INVASIVE CV LAB;;   Prox RCA lesion is 90% stenosed.-Small to moderate caliber nondominant vessel (<2 mm) => not PCI amenable.  Not the cause of cardiomyopathy.   Otherwise minimal diffuse CAD and a Left Dominant System.   LV EDP is normal with Systemic Hypertension (Central Aop ~20 mmHg > BP Cuff)   TRANSTHORACIC ECHOCARDIOGRAM  08/31/2020   a) EF 30 and 35%.  Mildly decreased  function.  Moderate LVH.  Unable to assess diastolic function.  Unable assess RV function.  Mild LA dilation.; b) 12/21/2020: EF remains 30 to 35% with moderately reduced function.  Global HK.  GR 1 DD.  Normal RV size and function.  Normal aortic valves.  Cannot exclude small PFO. => 02/07/21 -> NEGATIVE BUBBLE STUDY.    Social History   Socioeconomic History   Marital status: Married    Spouse name: Not on file   Number of children: Not on file   Years of education: Not on file   Highest education level: Not on file  Occupational History   Not on file  Tobacco Use   Smoking status: Never   Smokeless tobacco: Never  Vaping Use   Vaping status: Never Used  Substance and Sexual Activity   Alcohol use: Not Currently   Drug use: Not Currently   Sexual activity: Not Currently  Other Topics Concern   Not on file  Social History Narrative   Not on file   Social Determinants of Health   Financial Resource Strain: Not on file  Food Insecurity: Not on file  Transportation Needs: Not on file  Physical Activity: Not on file  Stress: Not on file  Social Connections: Not on file    Family History  Problem Relation Age of Onset   Hypertension Mother    Diabetes Mother    Heart failure Father 68   Heart attack Father 61   Hypertension Father    Diabetes type II Father    Coronary artery disease Father 32   Congestive Heart Failure Father    Diabetes Brother     Outpatient Encounter Medications as of 09/09/2022  Medication Sig   aspirin EC 81 MG tablet Take 81 mg by mouth in the morning. Swallow whole.   B-D UF III MINI PEN NEEDLES 31G X 5 MM MISC SMARTSIG:1 Pre-Filled Pen Syringe SUB-Q 4 Times Daily   carvedilol (COREG) 6.25 MG tablet Take 2 tablets (12.5 mg total) by mouth 2 (two) times daily with a meal.   Continuous Blood Gluc Sensor (FREESTYLE LIBRE 2 SENSOR) MISC Use to monitor glucose continuously   ezetimibe (ZETIA) 10 MG tablet Take 1 tablet (10 mg total) by mouth  daily.   furosemide (LASIX) 20 MG tablet Take 1 tablet (20 mg total) by mouth daily.   insulin glargine, 1 Unit Dial, (TOUJEO) 300 UNIT/ML Solostar Pen Inject 16 Units into the skin at bedtime.   Insulin Pen Needle (PEN NEEDLES) 30G X 5 MM MISC 1 pen by Does not apply route 4 (four) times daily - after meals and at bedtime.   NOVOLOG FLEXPEN 100 UNIT/ML FlexPen Inject 4-10 Units into the skin 3 (three) times daily with meals.   spironolactone (ALDACTONE) 25 MG tablet Take 25 mg by mouth daily.   Vitamin D, Ergocalciferol, (DRISDOL) 1.25 MG (50000 UNIT) CAPS capsule Take 1 capsule (50,000 Units total) by mouth every 7 (seven) days.   [DISCONTINUED] losartan (COZAAR) 25 MG tablet Take 1 tablet (25 mg total) by mouth daily.   Facility-Administered Encounter Medications as of 09/09/2022  Medication   sodium chloride flush (NS) 0.9 % injection 10 mL    ALLERGIES: No Known Allergies  VACCINATION STATUS: Immunization History  Administered Date(s) Administered   Tdap 11/28/2021   Zoster Recombinant(Shingrix) 11/28/2021, 02/06/2022    Diabetes He presents for his follow-up diabetic visit. He has type 2 diabetes mellitus. Onset time: Diagnosed at approx age of 34. His disease course has been improving. There are no hypoglycemic associated symptoms. Pertinent negatives for diabetes include no fatigue. There are no hypoglycemic complications. Symptoms are stable. Diabetic complications include a CVA and heart disease. Risk factors for coronary artery disease include diabetes mellitus, dyslipidemia, family history, male sex, obesity, hypertension and sedentary lifestyle. Current diabetic treatment includes intensive insulin program. He is compliant with treatment most of the time. His weight is fluctuating minimally. He is following a generally healthy diet. When asked about meal planning, he reported none. He has not had a previous visit with a dietitian. He rarely participates in exercise. His home  blood glucose trend is decreasing steadily. His overall blood glucose range is 140-180 mg/dl. (He presents today with his CGM, no logs, showing improved, mostly at goal glycemic profile.  His most recent A1c checked on 6/21 was 8%, increasing from last visit of 7.6%.  Analysis of his CGM shows TIR 78%, TAR 22%, TBR 0% with a GMI of 7%.  He notes his glucose has been so good recently that he hardly has to inject his meal time insulin.) An ACE inhibitor/angiotensin II receptor blocker is being taken.  He does not see a podiatrist.Eye exam is current.  Hypertension This is a chronic problem. The current episode started more than 1 year ago. The problem has been resolved since onset. The problem is controlled. There are no associated agents to hypertension. Risk factors for coronary artery disease include diabetes mellitus, dyslipidemia, family history, obesity, male gender and sedentary lifestyle. Past treatments include beta blockers, diuretics and angiotensin blockers. The current treatment provides significant improvement. There are no compliance problems.  Hypertensive end-organ damage includes CAD/MI and CVA.  Hyperlipidemia This is a chronic problem. The current episode started more than 1 year ago. The problem is controlled. Recent lipid tests were reviewed and are normal. Exacerbating diseases include diabetes and obesity. Factors aggravating his hyperlipidemia include beta blockers and fatty foods. Current antihyperlipidemic treatment includes statins. The current treatment provides moderate improvement of lipids. Compliance problems include adherence to diet and adherence to exercise.  Risk factors for coronary artery disease include diabetes mellitus, dyslipidemia, family history, hypertension, male sex, obesity and a sedentary lifestyle.     Review of systems  Constitutional: + Minimally fluctuating body weight, current Body mass index is 35.73 kg/m., + fatigue, no subjective hyperthermia, no  subjective hypothermia Eyes: no blurry vision, no xerophthalmia ENT: no sore throat, no nodules palpated in throat, + dysphagia from previous stroke, no hoarseness Cardiovascular: no chest pain, no shortness of breath, no palpitations, no leg swelling Respiratory: no cough, no shortness of breath Gastrointestinal: no nausea/vomiting/diarrhea Musculoskeletal: no muscle/joint aches Skin: no rashes, no hyperemia Neurological: no tremors, no numbness, no tingling, no dizziness Psychiatric: no depression, no anxiety  Objective:     BP 139/76 (BP Location: Right Arm, Patient Position: Sitting, Cuff Size: Large)   Pulse 84   Ht 5\' 8"  (1.727 m)   Wt 235 lb (106.6 kg)   BMI 35.73 kg/m   Wt Readings from Last 3 Encounters:  09/09/22 235 lb 3.2 oz (106.7 kg)  09/09/22 235 lb (106.6 kg)  08/25/22 233 lb 12.8 oz (106.1 kg)     BP Readings from Last 3 Encounters:  09/09/22 (!) 142/80  09/09/22 139/76  08/25/22 (!) 140/77      Physical Exam- Limited  Constitutional:  Body mass index is 35.73 kg/m. , not in acute distress, normal state of mind Eyes:  EOMI, no exophthalmos Musculoskeletal: no gross deformities, strength intact in all four extremities, no gross restriction of joint movements Skin:  no rashes, no hyperemia Neurological: no tremor with outstretched hands    Diabetic Foot Exam - Simple   No data filed       CMP ( most recent) CMP     Component Value Date/Time   NA 140 08/08/2022 0816   K 4.5 08/08/2022 0816   CL 104 08/08/2022 0816   CO2 20 08/08/2022 0816   GLUCOSE 158 (H) 08/08/2022 0816   GLUCOSE 161 (H) 09/02/2020 0451   BUN 19 08/08/2022 0816   CREATININE 0.81 08/08/2022 0816   CALCIUM 8.6 08/08/2022 0816   PROT 6.2 08/08/2022 0816   ALBUMIN 4.4 08/08/2022 0816   AST 16 08/08/2022 0816   ALT 19 08/08/2022 0816   ALKPHOS 94 08/08/2022 0816   BILITOT 0.3 08/08/2022 0816   GFRNONAA 95 09/23/2020 0000   GFRNONAA >60 09/02/2020 0451     Diabetic  Labs (most recent): Lab Results  Component Value Date   HGBA1C 8.0 (H) 08/08/2022   HGBA1C 7.6 (A) 05/08/2022   HGBA1C 8.3 (H) 02/03/2022   MICROALBUR 80mg /L 05/08/2022  MICROALBUR 10 11/15/2020     Lipid Panel ( most recent) Lipid Panel     Component Value Date/Time   CHOL 118 08/08/2022 0816   TRIG 257 (H) 08/08/2022 0816   HDL 29 (L) 08/08/2022 0816   CHOLHDL 4.1 08/08/2022 0816   CHOLHDL 3.7 03/11/2021 0756   VLDL 21 03/11/2021 0756   LDLCALC 49 08/08/2022 0816   LABVLDL 40 08/08/2022 0816      Lab Results  Component Value Date   TSH 1.620 08/08/2022   TSH 1.920 02/03/2022   TSH 1.290 11/08/2021   TSH 0.787 09/01/2020   FREET4 1.13 02/03/2022   FREET4 1.20 11/08/2021           Assessment & Plan:   1) Type 2 diabetes mellitus with hyperglycemia, with long-term current use of insulin (HCC)  He presents today with his CGM, no logs, showing improved, mostly at goal glycemic profile.  His most recent A1c checked on 6/21 was 8%, increasing from last visit of 7.6%.  Analysis of his CGM shows TIR 78%, TAR 22%, TBR 0% with a GMI of 7%.  He notes his glucose has been so good recently that he hardly has to inject his meal time insulin.  - Tony Combs has currently uncontrolled symptomatic type 2 DM since 62 years of age.  -Recent labs reviewed.  - I had a long discussion with him about the progressive nature of diabetes and the pathology behind its complications. -his diabetes is complicated by CVA, CAD and he remains at a high risk for more acute and chronic complications which include CAD, CVA, CKD, retinopathy, and neuropathy. These are all discussed in detail with him.  - Nutritional counseling repeated at each appointment due to patients tendency to fall back in to old habits.  - The patient admits there is a room for improvement in their diet and drink choices. -  Suggestion is made for the patient to avoid simple carbohydrates from their diet including  Cakes, Sweet Desserts / Pastries, Ice Cream, Soda (diet and regular), Sweet Tea, Candies, Chips, Cookies, Sweet Pastries, Store Bought Juices, Alcohol in Excess of 1-2 drinks a day, Artificial Sweeteners, Coffee Creamer, and "Sugar-free" Products. This will help patient to have stable blood glucose profile and potentially avoid unintended weight gain.   - I encouraged the patient to switch to unprocessed or minimally processed complex starch and increased protein intake (animal or plant source), fruits, and vegetables.   - Patient is advised to stick to a routine mealtimes to eat 3 meals a day and avoid unnecessary snacks (to snack only to correct hypoglycemia).  - he will be scheduled with Tony Combs, Tony Combs, Tony Combs for diabetes education.    - I have approached him with the following individualized plan to manage his diabetes and patient agrees:   -He is advised to continue his Toujeo to 16 units SQ nightly and Novolog 4-10 units TID with meals if glucose is above 90 and he is eating (Specific instructions on how to titrate insulin dosage based on glucose readings given to patient in writing).    -he is encouraged to continue monitoring glucose 4 times daily (using his CGM), before meals and before bed, and to call the clinic if he has readings less than 70 or above 300 for 3 tests in a row.    - he is warned not to take insulin without proper monitoring per orders. - Adjustment parameters are given to him for hypo and hyperglycemia in writing.  -  Specific targets for  A1c; LDL, HDL, and Triglycerides were discussed with the patient.  2) Blood Pressure /Hypertension:  his blood pressure is controlled to target.   he is advised to continue his current medications including Coreg 12.5 mg p.o. twice daily, Lasix 20 mg po daily, Losartan 50 mg po daily, and Spironolactone 25 mg po daily.  He could not afford the copay for Entresto.  3) Lipids/Hyperlipidemia:    Review of his recent lipid panel  from 08/08/22 showed controlled LDL at 49 and elevated triglycerides of 257.  he is advised to continue Zetia 10 mg po daily mg daily at bedtime.  Side effects and precautions discussed with him.  4)  Weight/Diet:  his Body mass index is 35.73 kg/m.  -  clearly complicating his diabetes care.   he is a candidate for weight loss. I discussed with him the fact that loss of 5 - 10% of his  current body weight will have the most impact on his diabetes management.  Exercise, and detailed carbohydrates information provided  -  detailed on discharge instructions.  5) Vitamin D Deficiency His recent vitamin D level on 08/08/22 was 26.5.  He is currently on supplementation, is advised to continue.  6) Chronic Care/Health Maintenance: -he is on ACEI/ARB and Statin medications and is encouraged to initiate and continue to follow up with Ophthalmology, Dentist, Podiatrist at least yearly or according to recommendations, and advised to stay away from smoking. I have recommended yearly flu vaccine and pneumonia vaccine at least every 5 years; moderate intensity exercise for up to 150 minutes weekly; and sleep for at least 7 hours a day.  - he is advised to maintain close follow up with Tony Combs, Tony Mellow, Tony Combs for primary care needs, as well as his other providers for optimal and coordinated care.      I spent  38  minutes in the care of the patient today including review of labs from CMP, Lipids, Thyroid Function, Hematology (current and previous including abstractions from other facilities); face-to-face time discussing  his blood glucose readings/logs, discussing hypoglycemia and hyperglycemia episodes and symptoms, medications doses, his options of short and long term treatment based on the latest standards of care / guidelines;  discussion about incorporating lifestyle medicine;  and documenting the encounter. Risk reduction counseling performed per USPSTF guidelines to reduce obesity and cardiovascular risk  factors.     Please refer to Patient Instructions for Blood Glucose Monitoring and Insulin/Medications Dosing Guide"  in media tab for additional information. Please  also refer to " Patient Self Inventory" in the Media  tab for reviewed elements of pertinent patient history.  Tony Combs participated in the discussions, expressed understanding, and voiced agreement with the above plans.  All questions were answered to his satisfaction. he is encouraged to contact clinic should he have any questions or concerns prior to his return visit.     Follow up plan: - Return in about 4 months (around 01/10/2023) for Diabetes F/U with A1c in office, No previsit labs, Bring meter and logs.   Tony Combs, [facility] [address] [place], [place] [address] Phone: [phone] Fax: [phone]  09/09/2022, 12:43 PM

## 2022-09-09 NOTE — Progress Notes (Signed)
Established Patient Office Visit  Subjective   Patient ID: Tony Combs, male    DOB: 1960-04-04  Age: 62 y.o. MRN: [id number]  Chief Complaint  Patient presents with   Hypertension    Follow up   Tony Combs returns to care today for HTN follow-up.  He was last evaluated by me on 6/25 at which time losartan was added to his antihypertensive regimen.  In the interim he has been seen by neurology and endocrinology for follow-up.  There have otherwise been no acute interval events.  Tony Combs reports feeling well today.  He is asymptomatic and has no additional concerns to discuss.  Past Medical History:  Diagnosis Date   CAD in native artery 03/11/2021   Small Non-dominant RCA - prox 90% (CTA FFR 0.66) - Not favorable for PCI - small caliber, non-dominant (<2 mm) vessel.  Mininal diffuse disease in LCx.   Diabetes mellitus without complication (HCC)    Dilated cardiomyopathy (HCC) 01/17/2021   In setting of stroke: TTE 08/31/2020: EF 30 and 35%.  Mildly decreased function.  Moderate LVH.  Unable to assess diastolic function.  Unable assess RV function.  Mild LA dilation.   Hyperlipidemia    Hypertension    Myasthenia gravis (HCC) 03/01/2021   Followed by Dr. 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   Past Surgical History:  Procedure Laterality Date   Coronary CT Angiogram  01/31/2021   Coronary calcium score 40.  No CAD RADS 3.  Moderate stenosis.  RCA has moderate obstructive plaque in the mid and distal vessel.  Otherwise 25-49% OM1. => CT FFR positive, 0.66 in the RCA.   HERNIA REPAIR     LEFT HEART CATH AND CORONARY ANGIOGRAPHY N/A 03/11/2021   Procedure: LEFT HEART CATH AND CORONARY ANGIOGRAPHY;  Surgeon: [name], MD;  Location: [facility] INVASIVE CV LAB;;   Prox RCA lesion is 90% stenosed.-Small to moderate caliber nondominant vessel (<2 mm) => not PCI amenable.  Not the cause of cardiomyopathy.    Otherwise minimal diffuse CAD and a Left Dominant System.   LV EDP is normal with Systemic Hypertension (Central Aop ~20 mmHg > BP Cuff)   TRANSTHORACIC ECHOCARDIOGRAM  08/31/2020   a) EF 30 and 35%.  Mildly decreased function.  Moderate LVH.  Unable to assess diastolic function.  Unable assess RV function.  Mild LA dilation.; b) 12/21/2020: EF remains 30 to 35% with moderately reduced function.  Global HK.  GR 1 DD.  Normal RV size and function.  Normal aortic valves.  Cannot exclude small PFO. => 02/07/21 -> NEGATIVE BUBBLE STUDY.   Social History   Tobacco Use   Smoking status: Never   Smokeless tobacco: Never  Vaping Use   Vaping status: Never Used  Substance Use Topics   Alcohol use: Not Currently   Drug use: Not Currently   Family History  Problem Relation Age of Onset   Hypertension Mother    Diabetes Mother    Heart failure Father 42   Heart attack Father 90   Hypertension Father    Diabetes type II Father    Coronary artery disease Father 32   Congestive Heart Failure Father    Diabetes Brother    No Known Allergies  Review of Systems  Constitutional:  Negative for chills and fever.  HENT:  Negative for sore throat.   Respiratory:  Negative for cough and shortness of  breath.   Cardiovascular:  Negative for chest pain, palpitations and leg swelling.  Gastrointestinal:  Negative for abdominal pain, blood in stool, constipation, diarrhea, nausea and vomiting.  Genitourinary:  Negative for dysuria and hematuria.  Musculoskeletal:  Negative for myalgias.  Skin:  Negative for itching and rash.  Neurological:  Negative for dizziness and headaches.  Psychiatric/Behavioral:  Negative for depression and suicidal ideas.      Objective:     BP (!) 142/80   Pulse 86   Ht 5\' 8"  (1.727 m)   Wt 235 lb 3.2 oz (106.7 kg)   SpO2 95%   BMI 35.76 kg/m  BP Readings from Last 3 Encounters:  09/09/22 (!) 142/80  09/09/22 139/76  08/25/22 (!) 140/77   Physical Exam Vitals  reviewed.  Constitutional:      General: He is not in acute distress.    Appearance: Normal appearance. He is not ill-appearing.  HENT:     Head: Normocephalic and atraumatic.     Right Ear: External ear normal.     Left Ear: External ear normal.     Nose: Nose normal. No congestion or rhinorrhea.     Mouth/Throat:     Mouth: Mucous membranes are moist.     Pharynx: Oropharynx is clear.  Eyes:     General: No scleral icterus.    Extraocular Movements: Extraocular movements intact.     Conjunctiva/sclera: Conjunctivae normal.     Pupils: Pupils are equal, round, and reactive to light.  Cardiovascular:     Rate and Rhythm: Normal rate and regular rhythm.     Pulses: Normal pulses.     Heart sounds: Normal heart sounds. No murmur heard. Pulmonary:     Effort: Pulmonary effort is normal.     Breath sounds: Normal breath sounds. No wheezing, rhonchi or rales.  Abdominal:     General: Abdomen is flat. Bowel sounds are normal. There is no distension.     Palpations: Abdomen is soft.     Tenderness: There is no abdominal tenderness.  Musculoskeletal:        General: No swelling or deformity. Normal range of motion.     Cervical back: Normal range of motion.  Skin:    General: Skin is warm and dry.     Capillary Refill: Capillary refill takes less than 2 seconds.  Neurological:     General: No focal deficit present.     Mental Status: He is alert and oriented to person, place, and time.     Motor: No weakness.  Psychiatric:        Mood and Affect: Mood normal.        Behavior: Behavior normal.        Thought Content: Thought content normal.   Last CBC Lab Results  Component Value Date   WBC 5.8 08/08/2022   HGB 12.1 (L) 08/08/2022   HCT 37.2 (L) 08/08/2022   MCV 89 08/08/2022   MCH 28.8 08/08/2022   RDW 13.7 08/08/2022   PLT 179 08/08/2022   Last metabolic panel Lab Results  Component Value Date   GLUCOSE 158 (H) 08/08/2022   NA 140 08/08/2022   K 4.5 08/08/2022    CL 104 08/08/2022   CO2 20 08/08/2022   BUN 19 08/08/2022   CREATININE 0.81 08/08/2022   EGFR 98 08/08/2022   CALCIUM 8.6 08/08/2022   PHOS 4.0 09/01/2020   PROT 6.2 08/08/2022   ALBUMIN 4.4 08/08/2022   LABGLOB 1.8 08/08/2022   AGRATIO  1.6 02/03/2022   BILITOT 0.3 08/08/2022   ALKPHOS 94 08/08/2022   AST 16 08/08/2022   ALT 19 08/08/2022   ANIONGAP 5 09/02/2020   Last lipids Lab Results  Component Value Date   CHOL 118 08/08/2022   HDL 29 (L) 08/08/2022   LDLCALC 49 08/08/2022   TRIG 257 (H) 08/08/2022   CHOLHDL 4.1 08/08/2022   Last hemoglobin A1c Lab Results  Component Value Date   HGBA1C 8.0 (H) 08/08/2022   Last thyroid functions Lab Results  Component Value Date   TSH 1.620 08/08/2022   Last vitamin D Lab Results  Component Value Date   VD25OH 26.5 (L) 08/08/2022     Assessment & Plan:   Problem List Items Addressed This Visit       Essential hypertension - Primary (Chronic)    Presenting today for HTN follow-up.  Losartan 25 mg daily was added to his antihypertensive regimen at his last appointment.  He is additionally prescribed carvedilol 12.5 mg twice daily and spironolactone 25 mg daily.  Previously on Entresto, but this was discontinued as it was cost prohibitive.  BP today has improved, but remains mildly elevated at 149/78 initially and 142/80 on repeat. -Increase losartan to 50 mg daily -Repeat BMP ordered today -He will return to care for previously scheduled follow-up in September.       Return in about 2 months (around 11/13/2022).    Billie Lade, MD

## 2022-09-09 NOTE — Assessment & Plan Note (Signed)
Presenting today for HTN follow-up.  Losartan 25 mg daily was added to his antihypertensive regimen at his last appointment.  He is additionally prescribed carvedilol 12.5 mg twice daily and spironolactone 25 mg daily.  Previously on Entresto, but this was discontinued as it was cost prohibitive.  BP today has improved, but remains mildly elevated at 149/78 initially and 142/80 on repeat. -Increase losartan to 50 mg daily -Repeat BMP ordered today -He will return to care for previously scheduled follow-up in September.

## 2022-09-09 NOTE — Patient Instructions (Signed)
It was a pleasure to see you today.  Thank you for giving [place] the opportunity to be involved in your care.  Below is a brief recap of your visit and next steps.  We will plan to see you again in September.  Summary Increase losartan to 50 mg daily Repeat labs Follow up as scheduled in September

## 2022-09-10 DIAGNOSIS — Z0271 Encounter for disability determination: Secondary | ICD-10-CM

## 2022-09-10 LAB — BASIC METABOLIC PANEL
BUN/Creatinine Ratio: 10 (ref 10–24)
BUN: 10 mg/dL (ref 8–27)
CO2: 23 mmol/L (ref 20–29)
Calcium: 9.1 mg/dL (ref 8.6–10.2)
Chloride: 101 mmol/L (ref 96–106)
Creatinine, Ser: 0.96 mg/dL (ref 0.76–1.27)
Glucose: 129 mg/dL — ABNORMAL HIGH (ref 70–99)
Potassium: 5.1 mmol/L (ref 3.5–5.2)
Sodium: 139 mmol/L (ref 134–144)
eGFR: 88 mL/min/{1.73_m2} (ref >59)

## 2022-10-02 ENCOUNTER — Other Ambulatory Visit: Payer: Self-pay | Admitting: Internal Medicine

## 2022-10-02 DIAGNOSIS — I10 Essential (primary) hypertension: Secondary | ICD-10-CM

## 2022-10-02 DIAGNOSIS — I420 Dilated cardiomyopathy: Secondary | ICD-10-CM

## 2022-10-09 ENCOUNTER — Telehealth: Payer: Self-pay | Admitting: Internal Medicine

## 2022-10-09 ENCOUNTER — Other Ambulatory Visit: Payer: Self-pay

## 2022-10-09 MED ORDER — CARVEDILOL 6.25 MG PO TABS: 12.5000 mg | ORAL_TABLET | Freq: Two times a day (BID) | ORAL | 0 refills | Status: DC

## 2022-10-09 NOTE — Telephone Encounter (Signed)
Spoke with patient.

## 2022-10-09 NOTE — Telephone Encounter (Signed)
Patient called asking why this medicine carvedilol (COREG) 6.25 MG tablet [[id number]]  ENDED was denied.  No longer get from this provider. Call patient at [phone].  Pharmacy: Pharmacy  [facility] [phone] [place], [place] - [address] [facility] [address] AT [facility] [address], [place] [address] Phone: [phone]  Fax: [phone] DEA #: [id number]

## 2022-10-24 ENCOUNTER — Other Ambulatory Visit: Payer: Self-pay | Admitting: Internal Medicine

## 2022-11-13 ENCOUNTER — Ambulatory Visit (INDEPENDENT_AMBULATORY_CARE_PROVIDER_SITE_OTHER): Payer: 59 | Admitting: Internal Medicine

## 2022-11-13 ENCOUNTER — Encounter: Payer: Self-pay | Admitting: Internal Medicine

## 2022-11-13 VITALS — BP 146/73 | HR 76 | Ht 68.0 in | Wt 237.4 lb

## 2022-11-13 DIAGNOSIS — Z2821 Immunization not carried out because of patient refusal: Secondary | ICD-10-CM | POA: Diagnosis not present

## 2022-11-13 DIAGNOSIS — I5042 Chronic combined systolic (congestive) and diastolic (congestive) heart failure: Secondary | ICD-10-CM | POA: Diagnosis not present

## 2022-11-13 DIAGNOSIS — E785 Hyperlipidemia, unspecified: Secondary | ICD-10-CM | POA: Diagnosis not present

## 2022-11-13 DIAGNOSIS — E1169 Type 2 diabetes mellitus with other specified complication: Secondary | ICD-10-CM

## 2022-11-13 DIAGNOSIS — G7000 Myasthenia gravis without (acute) exacerbation: Secondary | ICD-10-CM

## 2022-11-13 DIAGNOSIS — E1165 Type 2 diabetes mellitus with hyperglycemia: Secondary | ICD-10-CM | POA: Diagnosis not present

## 2022-11-13 DIAGNOSIS — G4733 Obstructive sleep apnea (adult) (pediatric): Secondary | ICD-10-CM

## 2022-11-13 DIAGNOSIS — Z794 Long term (current) use of insulin: Secondary | ICD-10-CM | POA: Diagnosis not present

## 2022-11-13 DIAGNOSIS — I10 Essential (primary) hypertension: Secondary | ICD-10-CM | POA: Diagnosis not present

## 2022-11-13 DIAGNOSIS — E1100 Type 2 diabetes mellitus with hyperosmolarity without nonketotic hyperglycemic-hyperosmolar coma (NKHHC): Secondary | ICD-10-CM

## 2022-11-13 MED ORDER — LOSARTAN POTASSIUM 100 MG PO TABS
100.0000 mg | ORAL_TABLET | Freq: Every day | ORAL | 2 refills | Status: DC
Start: 2022-11-13 — End: 2023-02-10

## 2022-11-13 NOTE — Patient Instructions (Signed)
It was a pleasure to see you today.  Thank you for giving [place] the opportunity to be involved in your care.  Below is a brief recap of your visit and next steps.  We will plan to see you again in 4 months.  Summary Increase losartan to 100 mg daily Ophthalmology referral placed Repeat labs in 2 weeks Follow up for routine care in 4 months

## 2022-11-13 NOTE — Assessment & Plan Note (Signed)
Followed by endocrinology.  A1c 8.0 in June. -Endocrinology follow-up scheduled for November -Ophthalmology referral placed today for diabetic eye exam -Ideal candidate for GLP-1 therapy.  I have recommended that he discuss this with endocrinology at follow-up.

## 2022-11-13 NOTE — Progress Notes (Signed)
Established Patient Office Visit  Subjective   Patient ID: Tony Combs, male    DOB: 06-07-1960  Age: 62 y.o. MRN: [id number]  Chief Complaint  Patient presents with   Follow-up    Follow up    Mr. Adragna returns to care today for routine follow-up.  He was last evaluated by me on 7/23 for HTN follow-up.  Losartan was increased to 50 mg daily at that time.  Follow-up was arranged for late September.  There have been no acute interval events.  Mr. Allert reports feeling well today.  He is asymptomatic and has no acute concerns to discuss.  Past Medical History:  Diagnosis Date   CAD in native artery 03/11/2021   Small Non-dominant RCA - prox 90% (CTA FFR 0.66) - Not favorable for PCI - small caliber, non-dominant (<2 mm) vessel.  Mininal diffuse disease in LCx.   Diabetes mellitus without complication (HCC)    Dilated cardiomyopathy (HCC) 01/17/2021   In setting of stroke: TTE 08/31/2020: EF 30 and 35%.  Mildly decreased function.  Moderate LVH.  Unable to assess diastolic function.  Unable assess RV function.  Mild LA dilation.   Hyperlipidemia    Hypertension    Myasthenia gravis (HCC) 03/01/2021   Followed by Dr. 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   Past Surgical History:  Procedure Laterality Date   Coronary CT Angiogram  01/31/2021   Coronary calcium score 40.  No CAD RADS 3.  Moderate stenosis.  RCA has moderate obstructive plaque in the mid and distal vessel.  Otherwise 25-49% OM1. => CT FFR positive, 0.66 in the RCA.   HERNIA REPAIR     LEFT HEART CATH AND CORONARY ANGIOGRAPHY N/A 03/11/2021   Procedure: LEFT HEART CATH AND CORONARY ANGIOGRAPHY;  Surgeon: [name], MD;  Location: [facility] INVASIVE CV LAB;;   Prox RCA lesion is 90% stenosed.-Small to moderate caliber nondominant vessel (<2 mm) => not PCI amenable.  Not the cause of cardiomyopathy.   Otherwise minimal diffuse CAD and a Left  Dominant System.   LV EDP is normal with Systemic Hypertension (Central Aop ~20 mmHg > BP Cuff)   TRANSTHORACIC ECHOCARDIOGRAM  08/31/2020   a) EF 30 and 35%.  Mildly decreased function.  Moderate LVH.  Unable to assess diastolic function.  Unable assess RV function.  Mild LA dilation.; b) 12/21/2020: EF remains 30 to 35% with moderately reduced function.  Global HK.  GR 1 DD.  Normal RV size and function.  Normal aortic valves.  Cannot exclude small PFO. => 02/07/21 -> NEGATIVE BUBBLE STUDY.   Social History   Tobacco Use   Smoking status: Never   Smokeless tobacco: Never  Vaping Use   Vaping status: Never Used  Substance Use Topics   Alcohol use: Not Currently   Drug use: Not Currently   Family History  Problem Relation Age of Onset   Hypertension Mother    Diabetes Mother    Heart failure Father 21   Heart attack Father 95   Hypertension Father    Diabetes type II Father    Coronary artery disease Father 4   Congestive Heart Failure Father    Diabetes Brother    No Known Allergies  Review of Systems  Constitutional:  Negative for chills and fever.  HENT:  Negative for sore throat.   Respiratory:  Negative for cough and shortness of breath.  Cardiovascular:  Negative for chest pain, palpitations and leg swelling.  Gastrointestinal:  Negative for abdominal pain, blood in stool, constipation, diarrhea, nausea and vomiting.  Genitourinary:  Negative for dysuria and hematuria.  Musculoskeletal:  Negative for myalgias.  Skin:  Negative for itching and rash.  Neurological:  Negative for dizziness and headaches.  Psychiatric/Behavioral:  Negative for depression and suicidal ideas.      Objective:     BP (!) 146/73 (BP Location: Right Arm, Patient Position: Sitting, Cuff Size: Large)   Pulse 76   Ht 5\' 8"  (1.727 m)   Wt 237 lb 6.4 oz (107.7 kg)   SpO2 95%   BMI 36.10 kg/m  BP Readings from Last 3 Encounters:  11/13/22 (!) 146/73  09/09/22 (!) 142/80  09/09/22 139/76    Physical Exam Vitals reviewed.  Constitutional:      General: He is not in acute distress.    Appearance: Normal appearance. He is obese. He is not ill-appearing.  HENT:     Head: Normocephalic and atraumatic.     Right Ear: External ear normal.     Left Ear: External ear normal.     Nose: Nose normal. No congestion or rhinorrhea.     Mouth/Throat:     Mouth: Mucous membranes are moist.     Pharynx: Oropharynx is clear.  Eyes:     General: No scleral icterus.    Extraocular Movements: Extraocular movements intact.     Conjunctiva/sclera: Conjunctivae normal.     Pupils: Pupils are equal, round, and reactive to light.  Cardiovascular:     Rate and Rhythm: Normal rate and regular rhythm.     Pulses: Normal pulses.     Heart sounds: Normal heart sounds. No murmur heard. Pulmonary:     Effort: Pulmonary effort is normal.     Breath sounds: Normal breath sounds. No wheezing, rhonchi or rales.  Abdominal:     General: Abdomen is flat. Bowel sounds are normal. There is no distension.     Palpations: Abdomen is soft.     Tenderness: There is no abdominal tenderness.  Musculoskeletal:        General: No swelling or deformity. Normal range of motion.     Cervical back: Normal range of motion.  Skin:    General: Skin is warm and dry.     Capillary Refill: Capillary refill takes less than 2 seconds.  Neurological:     General: No focal deficit present.     Mental Status: He is alert and oriented to person, place, and time.     Motor: No weakness.  Psychiatric:        Mood and Affect: Mood normal.        Behavior: Behavior normal.        Thought Content: Thought content normal.   Last CBC Lab Results  Component Value Date   WBC 5.8 08/08/2022   HGB 12.1 (L) 08/08/2022   HCT 37.2 (L) 08/08/2022   MCV 89 08/08/2022   MCH 28.8 08/08/2022   RDW 13.7 08/08/2022   PLT 179 08/08/2022   Last metabolic panel Lab Results  Component Value Date   GLUCOSE 129 (H) 09/09/2022   NA  139 09/09/2022   K 5.1 09/09/2022   CL 101 09/09/2022   CO2 23 09/09/2022   BUN 10 09/09/2022   CREATININE 0.96 09/09/2022   EGFR 88 09/09/2022   CALCIUM 9.1 09/09/2022   PHOS 4.0 09/01/2020   PROT 6.2 08/08/2022   ALBUMIN  4.4 08/08/2022   LABGLOB 1.8 08/08/2022   AGRATIO 1.6 02/03/2022   BILITOT 0.3 08/08/2022   ALKPHOS 94 08/08/2022   AST 16 08/08/2022   ALT 19 08/08/2022   ANIONGAP 5 09/02/2020   Last lipids Lab Results  Component Value Date   CHOL 118 08/08/2022   HDL 29 (L) 08/08/2022   LDLCALC 49 08/08/2022   TRIG 257 (H) 08/08/2022   CHOLHDL 4.1 08/08/2022   Last hemoglobin A1c Lab Results  Component Value Date   HGBA1C 8.0 (H) 08/08/2022   Last thyroid functions Lab Results  Component Value Date   TSH 1.620 08/08/2022   Last vitamin D Lab Results  Component Value Date   VD25OH 26.5 (L) 08/08/2022     Assessment & Plan:   Problem List Items Addressed This Visit       Essential hypertension - Primary (Chronic)    BP remains mildly elevated today.  His current antihypertensive regimen consists of losartan 50 mg daily, carvedilol 12.5 mg twice daily, and spironolactone 25 mg daily.  Losartan was increased to 50 mg daily at his last appointment.  He checks his blood pressure regularly at home and reports systolic readings consistently ranging 140-160 mmHg. -Increase losartan to 100 mg daily -Repeat BMP in 2 weeks      Chronic combined systolic and diastolic heart failure (HCC) (Chronic)    Remains euvolemic on exam.  EF recovered on TTE from July.  On appropriate GDMT.  Reports that he will follow-up with cardiology in early 2025.      OSA (obstructive sleep apnea)    He endorses inconsistent CPAP use.  Counseled on the importance of nightly CPAP compliance.      Hyperlipidemia associated with type 2 diabetes mellitus (HCC) (Chronic)    Currently prescribed Zetia 10 mg daily.  Lipid panel updated in June.  Total cholesterol 118 and LDL 49.   Atorvastatin was discontinued by cardiology due to concern that it was worsening MG symptoms.  No medication changes have been made today.      Type 2 diabetes mellitus with hyperglycemia (HCC)    Followed by endocrinology.  A1c 8.0 in June. -Endocrinology follow-up scheduled for November -Ophthalmology referral placed today for diabetic eye exam -Ideal candidate for GLP-1 therapy.  I have recommended that he discuss this with endocrinology at follow-up.      Myasthenia gravis (HCC)    Followed by neurology.  He has mostly still he received Vyvgart infusions with partial response.  He will return for follow-up with neurology next month.      Return in about 4 months (around 03/15/2023).   Billie Lade, MD

## 2022-11-13 NOTE — Assessment & Plan Note (Signed)
He endorses inconsistent CPAP use.  Counseled on the importance of nightly CPAP compliance.

## 2022-11-13 NOTE — Assessment & Plan Note (Signed)
Currently prescribed Zetia 10 mg daily.  Lipid panel updated in June.  Total cholesterol 118 and LDL 49.  Atorvastatin was discontinued by cardiology due to concern that it was worsening MG symptoms.  No medication changes have been made today.

## 2022-11-13 NOTE — Assessment & Plan Note (Signed)
Remains euvolemic on exam.  EF recovered on TTE from July.  On appropriate GDMT.  Reports that he will follow-up with cardiology in early 2025.

## 2022-11-13 NOTE — Assessment & Plan Note (Signed)
BP remains mildly elevated today.  His current antihypertensive regimen consists of losartan 50 mg daily, carvedilol 12.5 mg twice daily, and spironolactone 25 mg daily.  Losartan was increased to 50 mg daily at his last appointment.  He checks his blood pressure regularly at home and reports systolic readings consistently ranging 140-160 mmHg. -Increase losartan to 100 mg daily -Repeat BMP in 2 weeks

## 2022-11-13 NOTE — Assessment & Plan Note (Signed)
Followed by neurology.  He has mostly still he received Vyvgart infusions with partial response.  He will return for follow-up with neurology next month.

## 2022-11-14 ENCOUNTER — Telehealth: Payer: Self-pay | Admitting: Internal Medicine

## 2022-11-14 NOTE — Telephone Encounter (Signed)
Dr I[facility] called no in network with patient insurance [employer].  Their office tried to reach patient unable to get in touch with the patient.

## 2022-11-18 ENCOUNTER — Ambulatory Visit: Payer: 59 | Admitting: Physician Assistant

## 2022-12-09 ENCOUNTER — Other Ambulatory Visit: Payer: Self-pay | Admitting: Internal Medicine

## 2022-12-09 NOTE — Progress Notes (Unsigned)
[facility] [address] [place]. [place] [address] [phone]       OFFICE FOLLOW-UP NOTE  Tony Combs Date of Birth:  [birth year] Medical Record Number:  [id number]   HPI:   Last visit 12/06/2020 [name] is a 62 year old Caucasian male seen today for initial office follow-up visit following hospital consultation for stroke in July 2022.  History is obtained from the patient and his wife and review of electronic medical records and I personally reviewed pertinent imaging films in PACS.  He has past medical history of hypertension, hyperlipidemia, diabetes, coronary artery disease who presented to [facility] ED on 08/31/2020 for sudden onset of slurred speech with visual blurring and double vision.  Symptoms began the day prior to admission she went to [facility] where his noncontrast CT scan of the head was done which was unremarkable but before seeing the doctor she left AGAINST MEDICAL ADVICE.  She presented to [facility] ED the next day with complaints of persistent slurred speech with headache and blurred and double vision.  He also noted right side of the face felt weak and tingling.  He also felt off balance and dizzy at work but denies any focal extremity weakness.  MRI scan of the brain showed a 12 mm acute infarct in the anterior medial right frontal subcortical white matter.  Remote age tiny chronic left thalamic infarct was also noted.  MR angiogram study of the brain showed multifocal stenoses.with mild-to-moderate stenosis within the distal A2 segment of the right anterior cerebral artery. Severe stenosis within the left posterior cerebral artery at the P1/P2 junction. and MRA of the neck was unremarkable.  2D echo showed diminished ejection fraction of 30 to 35% but no clot.  LDL cholesterol 61 mg percent and hemoglobin A1c was elevated at 11.7.  Patient was felt to be at high risk for sleep apnea and offered participation in the sleep smart study  for stroke prevention and agreed to participate.  He was randomized to this CPAP treatment for his sleep apnea.  On exam patient was felt to have dysarthric and hypophonic speech with subtle ptosis of his left eye with left pupil being smaller in size in the right and because of his subjective complaints of diplopia, dysarthria and gait ataxia felt to have a small brainstem infarct not visualized on the MRI in addition to which was seen in the right subcortical region which could not explain his symptoms.  He was discharged on aspirin and Plavix for 3 months followed by aspirin alone.  Patient states that is obtained some improvement but his wife still becomes soft and slurred when he is tired.  Is also has some intermittent swallowing difficulties.  He also complains of persistent blurred and double vision particularly towards the end of the day.  His mornings are much better.  His balance is still not right so uses a cane to walk.  He has had no falls or injuries.  His wife has noted that at the end of the day his eyes are not synchronous and the left eye is does not move as much as the right.  Patient is tolerating aspirin well without bruising or bleeding.  His blood pressure is well controlled today it is 129/75.  His sugars are doing better and hemoglobin A1c is now down to 7.  His average blood glucose for the last 10 days is 142.  He remains on Lipitor which is tolerating well without side effects.  He has been compliant with using his CPAP mask which he got through the sleep smart study and feels it is helping him.  Update 02/26/2021 : He returns for follow-up after last visit 2-1/2 months ago.  Is recommended by his wife.  Patient continues to have speech and swallowing difficulties which is more or less unchanged.  In his diet his speech often becomes slurred.  He denies any double vision but wife does notice some drooping eyelids to the end of the day.  He also has trouble swallowing at times.  Patient  did not tolerate Mestinon due to cramps and diarrhea and he takes only 30 mg once a day he is not sure if this is helping him.  He denies any trouble getting out of chair or walking or weakness of proximal shoulder or thigh muscles.  Lab work on 12/06/2020 shows strongly positive acetylcholine receptor binding antibody titer of 3.62 as well as acetylcholine blocking antibody in a titer of 95.  He previously had CT scan of the chest done on 08/30/2020 which did not show any thymoma but showed some mediastinal lymphadenopathy.  Hemoglobin A1c done on 02/19/2021 was elevated at 7.1.  EMG nerve conduction study with rapid repetitive stimulation on 01/24/2021 was positive for decrement confirming diagnosis of myasthenia.  It also showed evidence of moderate right carpal tunnel and right ulnar nerve entrapment at the elbow.  Patient does complain of hand paresthesias and some grip weakness.  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 Update 07/04/2021 ; He returns for follow-up after last visit 4 months ago.  He is accompanied by his exposures.  Patient states he is finished for injections of Vyvgart at 1 weekly intervals on 05/15/2021 and has noticed improvement in his speech and swallowing.  He does occasionally get intermittent diplopia which is transient.  He denies any drooping of eyelids or significant dysphagia or muscle weakness or fatigability.H his speech has remained quite good over the last 1 month despite not having injections.  He had trouble tolerating Mestinon due to diarrhea in the past and is not a good candidate for long-term steroids given his obesity and comorbidities.  He has had no recurrent stroke or TIA symptoms.  He remains on aspirin which is tolerating well without bruising or bleeding and Lipitor without muscle aches and pains.  He states has not been quite regular using his CPAP for his sleep apnea.  His blood pressures under good control today it is 139/73.  His  diabetes is yet suboptimally controlled with last A1c being 8.2.  He has no new complaints.  Update 12/17/2021 Dr. Pearlean Brownie: He returns for follow-up after last visit 5 months ago.  He is accompanied by his wife.  Patient continues to have symptoms of his myasthenia, generalized fatigability and tiredness symptoms have diurnal fluctuation and appears more prominent when he is tired in the meantime.  Eating as well.  He voids also.  Patient has trouble tolerating due to side effects and does not want to take steroids or  CellCept due to his uncontrolled diabetes and medical comorbidities.  He was treated with 2 injections of Vygardt quite substantial improvement in all the symptoms of myasthenia that he was having and this was sustained for around 2 months but his symptoms have returned since then.  He can barely look up without seeing double with his diet to a midday and has trouble doing his meals and his wife today.  Insurance company refused further injections we will try Rystiggo instead at this time.  Continues to do well from stroke standpoint without recurrent stroke or TIA symptoms.  He remains on aspirin which is tolerating well without side effects.  He is tolerating Lipitor well and last lipid profile on 11/08/2021 showed LDL cholesterol to be optimal at 59.  His diabetes remains poorly controlled and last A1c was 8.9.  He is not too compliant with his CPAP for his sleep apnea and I counseled him.   Update 04/09/2022 JM: Patient returns for follow-up accompanied by his wife.  At prior visit, initiated Rystiggo for [facility] but still waiting insurance approval, last update in epic 2/14 still pending. Overall feels about the same since last visit, continues to have fluctuation of generalized fatigability and weakness, can have good days and bad days. Usually does fairly well in the morning and symptoms can worsen in the evening.  Continue to use a cane for ambulation.  Will have occasional double vision  especially when looking from side to side or looking up.  He continues to have swallowing difficulties, believes this is unchanged.  Stable from stroke standpoint without new or reoccurring stroke/TIA symptoms.  Compliant on aspirin and atorvastatin.  Blood pressure well-controlled.  Routinely follows with PCP.  08/25/2022 Dr. Pearlean Brownie: Patient returns for follow-up after last visit 4 months ago.  Patient continues to have symptoms of myasthenia.  He has intermittent fatigue, tiredness, drooping of the eyelid, diplopia muscle weakness particularly more towards the evening.  He was denied repeat injections of Rystiggo by insurance company despite appeals we will go ahead and good initial response.  First infusion of Vyvgart instead was 2 months ago but does not seem to help from sustained benefit though he did get some mild benefit initially.  He still gets intermittent diplopia, drooping of the eyelids and tiredness particularly towards the end of the day.  He denies significant dysphagia but does get hoarseness of voice.  He does get short of breath with exertion and occasionally at rest.  He has not had recurrent stroke or TIA symptoms.  Remains on aspirin tolerating well with minor bruising and no bleeding.  States his diabetes is under good control.  However his last hemoglobin A1c on 08/08/2022 was 8.0.  Lipid profile on 08/08/2022 showed LDL cholesterol to be optimal at 49 mg percent..  Myasthenia gravis antibody panel on 04/24/2022 showed slight reduction in acetylcholine binding antibody to 2.27,AcetylCholine blocking antibody 82.   ACH Modulating antibody was elevated at 67.  Anti striation antibodies were elevated at 1 : 32000   Update 12/10/2022 JM: Patient returns for follow-up visit.  Recent notification insurance denied Vyvgart infusions        ROS:   14 system review of systems is positive for those listed in HPI and all other systems negative  PMH:  Past Medical History:  Diagnosis Date    CAD in native artery 03/11/2021   Small Non-dominant RCA - prox 90% (CTA FFR 0.66) - Not favorable for PCI - small caliber, non-dominant (<2 mm) vessel.  Mininal diffuse disease in LCx.   Diabetes mellitus without complication (HCC)    Dilated cardiomyopathy (HCC) 01/17/2021   In setting of stroke: TTE 08/31/2020: EF 30 and 35%.  Mildly decreased function.  Moderate LVH.  Unable to assess diastolic function.  Unable assess RV function.  Mild LA dilation.   Hyperlipidemia    Hypertension    Myasthenia gravis (HCC) 03/01/2021   Followed by  Dr. 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    Social History:  Social History   Socioeconomic History   Marital status: Married    Spouse name: Not on file   Number of children: Not on file   Years of education: Not on file   Highest education level: Not on file  Occupational History   Not on file  Tobacco Use   Smoking status: Never   Smokeless tobacco: Never  Vaping Use   Vaping status: Never Used  Substance and Sexual Activity   Alcohol use: Not Currently   Drug use: Not Currently   Sexual activity: Not Currently  Other Topics Concern   Not on file  Social History Narrative   Not on file   Social Determinants of Health   Financial Resource Strain: Not on file  Food Insecurity: Not on file  Transportation Needs: Not on file  Physical Activity: Not on file  Stress: Not on file  Social Connections: Not on file  Intimate Partner Violence: Not on file    Medications:   Current Outpatient Medications on File Prior to Visit  Medication Sig Dispense Refill   aspirin EC 81 MG tablet Take 81 mg by mouth in the morning. Swallow whole.     B-D UF III MINI PEN NEEDLES 31G X 5 MM MISC SMARTSIG:1 Pre-Filled Pen Syringe SUB-Q 4 Times Daily     carvedilol (COREG) 6.25 MG tablet TAKE 2 TABLETS BY MOUTH 2 TIMES DAILY WITH A MEAL. 360 tablet 1   Continuous Blood  Gluc Sensor (FREESTYLE LIBRE 2 SENSOR) MISC Use to monitor glucose continuously 6 each 3   ezetimibe (ZETIA) 10 MG tablet Take 1 tablet (10 mg total) by mouth daily. 90 tablet 3   furosemide (LASIX) 20 MG tablet Take 1 tablet (20 mg total) by mouth daily. 90 tablet 3   insulin glargine, 1 Unit Dial, (TOUJEO) 300 UNIT/ML Solostar Pen Inject 16 Units into the skin at bedtime. 18 mL 3   Insulin Pen Needle (PEN NEEDLES) 30G X 5 MM MISC 1 pen by Does not apply route 4 (four) times daily - after meals and at bedtime. 200 each 1   losartan (COZAAR) 100 MG tablet Take 1 tablet (100 mg total) by mouth daily. 90 tablet 2   NOVOLOG FLEXPEN 100 UNIT/ML FlexPen Inject 4-10 Units into the skin 3 (three) times daily with meals. 15 mL 3   spironolactone (ALDACTONE) 25 MG tablet Take 25 mg by mouth daily.     Vitamin D, Ergocalciferol, (DRISDOL) 1.25 MG (50000 UNIT) CAPS capsule Take 1 capsule (50,000 Units total) by mouth every 7 (seven) days. 20 capsule 1   Current Facility-Administered Medications on File Prior to Visit  Medication Dose Route Frequency Provider Last Rate Last Admin   sodium chloride flush (NS) 0.9 % injection 10 mL  10 mL Intravenous PRN [name], MD   20 mL at 02/07/21 9323    Allergies:  No Known Allergies  Physical Exam There were no vitals filed for this visit.  There is no height or weight on file to calculate BMI.   General: Obese middle-aged Caucasian male, seated, in no evident distress Head: head normocephalic and atraumatic.  Neck: supple with no carotid or supraclavicular bruits Cardiovascular: regular rate and rhythm, no murmurs Musculoskeletal: no deformity Skin:  no rash/petichiae Vascular:  Normal pulses all extremities  Neurologic Exam Mental Status: Awake and  fully alert. Oriented to place and time. Recent and remote memory intact. Attention span, concentration and fund of knowledge appropriate. Mood and affect appropriate.  Voice seems normal today.  No  dysarthria. Cranial Nerves: Pupils equal, briskly reactive to light. Extraocular movements full without nystagmus. Visual fields full to confrontation. Hearing intact. Facial sensation intact. Face, tongue, palate moves normally and symmetrically.  Mild drooping of both eyelids on sustained upgaze greater than 30 seconds left more than right.  Light hypotropia of the left eye looking.  Develops subjective diplopia and no ophthalmoplegia. Motor: Normal bulk and tone. Normal strength in all tested extremity muscles.  Subjective fatigability  when arms outstretched more than 30 seconds Sensory.: intact to touch ,pinprick .position and vibratory sensation.  Coordination: Rapid alternating movements normal in all extremities. Finger-to-nose and heel-to-shin performed accurately bilaterally. Gait and Station: Arises from chair without difficulty. Stance is normal. Gait demonstrates good stride length with decreased step height bilaterally and mild unsteadiness with use of cane.  Tandem walk and heel toe not attempted. Reflexes: 1+ and symmetric. Toes downgoing.    MG classification- fluctuates between class 2-3 MG ADL score 12    ASSESSMENT/PLAN: 62 year old Caucasian male with sudden onset of slurred speech, diplopia and dysphagia in July 2022 likely from small brainstem infarct not visualized on MRI from small vessel disease.  MRI at that time had shown also right periventricular white matter infarct which was probably silent.  Multiple vascular risk factors of hypertension, diabetes, hyperlipidemia obesity and sleep apnea.  He continues to be followed in office for myasthenia gravis with symptoms of increased fatigability, tiredness and worsening speech and swallowing, continues to have positive acetylcholine antibodies    1.  Myasthenia gravis -*** -MGFA class *** -MG ADL score *** -Intolerant to Mestinon, steroids and CellCept contraindicated due to diabetes -Insurance denied Rystiggo   I had  a long discussion with the patient regarding his myasthenia symptoms which had improved with injection of Rystiggo but it was denied by [employer] despite appeal.  He has been not been able to tolerate Mestinon in the past steroids and CellCept are relatively contraindicated due to his diabetes.  He has received first dose of Vyvgart infusion 2 months ago but had only mild improvement.. His MG-ADL score today is 12 with an MGFA class II-!!! fluctuating symptoms.  Will try for repeat Vyvgart if approved by insurance.  Continue aspirin for stroke prevention and maintain aggressive risk factor modification with strict control of lipids with LDL cholesterol goal below 70 mg percent and hypertension with goal below 1.  Good control of diabetes with hemoglobin A1c goal below 6.5%.  Will return for follow-up in the future with nurse practitioner Tony Combs f in 2 months or call earlier if necessary   I spent *** minutes of face-to-face and non-face-to-face time with patient.  This included previsit chart review, lab review, study review, order entry, electronic health record documentation, patient education and discussion regarding above diagnoses and treatment plan and answered all other questions to patient's satisfaction  Tony Combs, [facility]  [facility] [address] [place], [place] [address]  Phone [phone] Fax [phone] Note: This document was prepared with digital dictation and possible smart phrase technology. Any transcriptional errors that result from this process are unintentional.

## 2022-12-10 ENCOUNTER — Ambulatory Visit (INDEPENDENT_AMBULATORY_CARE_PROVIDER_SITE_OTHER): Payer: 59 | Admitting: Adult Health

## 2022-12-10 ENCOUNTER — Encounter: Payer: Self-pay | Admitting: Adult Health

## 2022-12-10 VITALS — BP 124/78 | HR 77 | Ht 69.0 in | Wt 237.2 lb

## 2022-12-10 DIAGNOSIS — Z8673 Personal history of transient ischemic attack (TIA), and cerebral infarction without residual deficits: Secondary | ICD-10-CM | POA: Diagnosis not present

## 2022-12-10 DIAGNOSIS — G7000 Myasthenia gravis without (acute) exacerbation: Secondary | ICD-10-CM | POA: Diagnosis not present

## 2022-12-10 NOTE — Progress Notes (Signed)
I agree with the above plan 

## 2022-12-10 NOTE — Patient Instructions (Addendum)
Your Plan:  Will keep you updated regarding next step for treatment options      Follow up will be determined after start of next treatment      Thank you for coming to see [place] at [facility]. I hope we have been able to provide you high quality care today.  You may receive a patient satisfaction survey over the next few weeks. We would appreciate your feedback and comments so that we may continue to improve ourselves and the health of our patients.

## 2022-12-10 NOTE — Progress Notes (Signed)
He has tried low dose of Mestinon previously and intolerant d/t GI effects, you did not recommend CellCept or prednisone due to diabetes. Insurance previously declined [employer].

## 2022-12-12 NOTE — Progress Notes (Signed)
I agree with the above plan 

## 2022-12-17 NOTE — Progress Notes (Signed)
I agree with the above plan 

## 2023-01-12 ENCOUNTER — Encounter: Payer: Self-pay | Admitting: Nurse Practitioner

## 2023-01-12 ENCOUNTER — Other Ambulatory Visit: Payer: Self-pay | Admitting: Family Medicine

## 2023-01-12 ENCOUNTER — Ambulatory Visit (INDEPENDENT_AMBULATORY_CARE_PROVIDER_SITE_OTHER): Payer: 59 | Admitting: Nurse Practitioner

## 2023-01-12 VITALS — BP 143/78 | HR 76 | Ht 68.0 in | Wt 239.2 lb

## 2023-01-12 DIAGNOSIS — I10 Essential (primary) hypertension: Secondary | ICD-10-CM | POA: Diagnosis not present

## 2023-01-12 DIAGNOSIS — Z794 Long term (current) use of insulin: Secondary | ICD-10-CM

## 2023-01-12 DIAGNOSIS — E782 Mixed hyperlipidemia: Secondary | ICD-10-CM | POA: Diagnosis not present

## 2023-01-12 DIAGNOSIS — E1165 Type 2 diabetes mellitus with hyperglycemia: Secondary | ICD-10-CM

## 2023-01-12 LAB — POCT GLYCOSYLATED HEMOGLOBIN (HGB A1C): Hemoglobin A1C: 8.2 % — AB (ref 4.0–5.6)

## 2023-01-12 NOTE — Progress Notes (Addendum)
Endocrinology Follow Up Note       01/12/2023, 8:56 AM   Subjective:    Patient ID: Tony Combs, male    DOB: 1960/05/03.  Tony Combs is being seen in follow up after being seen in consultation for management of currently uncontrolled symptomatic diabetes requested by  Billie Lade, MD.   Past Medical History:  Diagnosis Date   CAD in native artery 03/11/2021   Small Non-dominant RCA - prox 90% (CTA FFR 0.66) - Not favorable for PCI - small caliber, non-dominant (<2 mm) vessel.  Mininal diffuse disease in LCx.   Diabetes mellitus without complication (HCC)    Dilated cardiomyopathy (HCC) 01/17/2021   In setting of stroke: TTE 08/31/2020: EF 30 and 35%.  Mildly decreased function.  Moderate LVH.  Unable to assess diastolic function.  Unable assess RV function.  Mild LA dilation.   Hyperlipidemia    Hypertension    Myasthenia gravis (HCC) 03/01/2021   Followed by Dr. 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    Past Surgical History:  Procedure Laterality Date   Coronary CT Angiogram  01/31/2021   Coronary calcium score 40.  No CAD RADS 3.  Moderate stenosis.  RCA has moderate obstructive plaque in the mid and distal vessel.  Otherwise 25-49% OM1. => CT FFR positive, 0.66 in the RCA.   HERNIA REPAIR     LEFT HEART CATH AND CORONARY ANGIOGRAPHY N/A 03/11/2021   Procedure: LEFT HEART CATH AND CORONARY ANGIOGRAPHY;  Surgeon: [name], MD;  Location: [facility] INVASIVE CV LAB;;   Prox RCA lesion is 90% stenosed.-Small to moderate caliber nondominant vessel (<2 mm) => not PCI amenable.  Not the cause of cardiomyopathy.   Otherwise minimal diffuse CAD and a Left Dominant System.   LV EDP is normal with Systemic Hypertension (Central Aop ~20 mmHg > BP Cuff)   TRANSTHORACIC ECHOCARDIOGRAM  08/31/2020   a) EF 30 and 35%.  Mildly decreased  function.  Moderate LVH.  Unable to assess diastolic function.  Unable assess RV function.  Mild LA dilation.; b) 12/21/2020: EF remains 30 to 35% with moderately reduced function.  Global HK.  GR 1 DD.  Normal RV size and function.  Normal aortic valves.  Cannot exclude small PFO. => 02/07/21 -> NEGATIVE BUBBLE STUDY.    Social History   Socioeconomic History   Marital status: Married    Spouse name: Not on file   Number of children: Not on file   Years of education: Not on file   Highest education level: Not on file  Occupational History   Not on file  Tobacco Use   Smoking status: Never   Smokeless tobacco: Never  Vaping Use   Vaping status: Never Used  Substance and Sexual Activity   Alcohol use: Not Currently   Drug use: Not Currently   Sexual activity: Not Currently  Other Topics Concern   Not on file  Social History Narrative   Not on file   Social Determinants of Health   Financial Resource Strain: Not on file  Food Insecurity: Not on file  Transportation Needs: Not on file  Physical Activity: Not on file  Stress: Not on file  Social Connections: Not on file    Family History  Problem Relation Age of Onset   Hypertension Mother    Diabetes Mother    Heart failure Father 82   Heart attack Father 65   Hypertension Father    Diabetes type II Father    Coronary artery disease Father 5   Congestive Heart Failure Father    Diabetes Brother     Outpatient Encounter Medications as of 01/12/2023  Medication Sig   aspirin EC 81 MG tablet Take 81 mg by mouth in the morning. Swallow whole.   B-D UF III MINI PEN NEEDLES 31G X 5 MM MISC SMARTSIG:1 Pre-Filled Pen Syringe SUB-Q 4 Times Daily   carvedilol (COREG) 6.25 MG tablet TAKE 2 TABLETS BY MOUTH 2 TIMES DAILY WITH A MEAL.   Continuous Blood Gluc Sensor (FREESTYLE LIBRE 2 SENSOR) MISC Use to monitor glucose continuously   ezetimibe (ZETIA) 10 MG tablet Take 1 tablet (10 mg total) by mouth daily.   furosemide  (LASIX) 20 MG tablet Take 1 tablet (20 mg total) by mouth daily.   insulin glargine, 1 Unit Dial, (TOUJEO) 300 UNIT/ML Solostar Pen Inject 16 Units into the skin at bedtime.   Insulin Pen Needle (PEN NEEDLES) 30G X 5 MM MISC 1 pen by Does not apply route 4 (four) times daily - after meals and at bedtime.   losartan (COZAAR) 100 MG tablet Take 1 tablet (100 mg total) by mouth daily.   NOVOLOG FLEXPEN 100 UNIT/ML FlexPen Inject 4-10 Units into the skin 3 (three) times daily with meals.   spironolactone (ALDACTONE) 25 MG tablet Take 25 mg by mouth daily.   Vitamin D, Ergocalciferol, (DRISDOL) 1.25 MG (50000 UNIT) CAPS capsule Take 1 capsule (50,000 Units total) by mouth every 7 (seven) days.   Facility-Administered Encounter Medications as of 01/12/2023  Medication   sodium chloride flush (NS) 0.9 % injection 10 mL    ALLERGIES: No Known Allergies  VACCINATION STATUS: Immunization History  Administered Date(s) Administered   Tdap 11/28/2021   Zoster Recombinant(Shingrix) 11/28/2021, 02/06/2022    Diabetes He presents for his follow-up diabetic visit. He has type 2 diabetes mellitus. Onset time: Diagnosed at approx age of 41. His disease course has been stable. There are no hypoglycemic associated symptoms. Pertinent negatives for diabetes include no fatigue. There are no hypoglycemic complications. Symptoms are stable. Diabetic complications include a CVA and heart disease. Risk factors for coronary artery disease include diabetes mellitus, dyslipidemia, family history, male sex, obesity, hypertension and sedentary lifestyle. Current diabetic treatment includes intensive insulin program. He is compliant with treatment most of the time. His weight is fluctuating minimally. He is following a generally healthy diet. When asked about meal planning, he reported none. He has not had a previous visit with a dietitian. He rarely participates in exercise. His home blood glucose trend is fluctuating  minimally. His overall blood glucose range is 140-180 mg/dl. (He presents today with his CGM showing improved, mostly at goal glycemic profile.  His POCT  A1c today is 8.2%, increasing slightly from last visit of 8%.  Analysis of his CGM shows TIR 54%, TAR 46%, TBR 0% with a GMI of 7.6%.  He has a tendency to overcorrect for low glucose (gets alarms when it drops) and eats a honey bun to bring it back up.) An ACE inhibitor/angiotensin II receptor blocker is being taken. He does not  see a podiatrist.Eye exam is current.  Hypertension This is a chronic problem. The current episode started more than 1 year ago. The problem has been resolved since onset. The problem is controlled. There are no associated agents to hypertension. Risk factors for coronary artery disease include diabetes mellitus, dyslipidemia, family history, obesity, male gender and sedentary lifestyle. Past treatments include beta blockers, diuretics and angiotensin blockers. The current treatment provides significant improvement. There are no compliance problems.  Hypertensive end-organ damage includes CAD/MI and CVA.  Hyperlipidemia This is a chronic problem. The current episode started more than 1 year ago. The problem is controlled. Recent lipid tests were reviewed and are normal. Exacerbating diseases include diabetes and obesity. Factors aggravating his hyperlipidemia include beta blockers and fatty foods. Current antihyperlipidemic treatment includes statins. The current treatment provides moderate improvement of lipids. Compliance problems include adherence to diet and adherence to exercise.  Risk factors for coronary artery disease include diabetes mellitus, dyslipidemia, family history, hypertension, male sex, obesity and a sedentary lifestyle.     Review of systems  Constitutional: + Minimally fluctuating body weight, current Body mass index is 36.37 kg/m., + fatigue, no subjective hyperthermia, no subjective hypothermia Eyes: no  blurry vision, no xerophthalmia ENT: no sore throat, no nodules palpated in throat, + dysphagia from previous stroke, no hoarseness Cardiovascular: no chest pain, no shortness of breath, no palpitations, no leg swelling Respiratory: no cough, no shortness of breath Gastrointestinal: no nausea/vomiting/diarrhea Musculoskeletal: no muscle/joint aches Skin: no rashes, no hyperemia Neurological: no tremors, no numbness, no tingling, no dizziness Psychiatric: no depression, no anxiety  Objective:     BP (!) 143/78 (BP Location: Right Arm, Patient Position: Sitting, Cuff Size: Large)   Pulse 76   Ht 5\' 8"  (1.727 m)   Wt 239 lb 3.2 oz (108.5 kg)   BMI 36.37 kg/m   Wt Readings from Last 3 Encounters:  01/12/23 239 lb 3.2 oz (108.5 kg)  12/10/22 237 lb 3.2 oz (107.6 kg)  11/13/22 237 lb 6.4 oz (107.7 kg)     BP Readings from Last 3 Encounters:  01/12/23 (!) 143/78  12/10/22 124/78  11/13/22 (!) 146/73      Physical Exam- Limited  Constitutional:  Body mass index is 36.37 kg/m. , not in acute distress, normal state of mind Eyes:  EOMI, no exophthalmos Musculoskeletal: no gross deformities, strength intact in all four extremities, no gross restriction of joint movements Skin:  no rashes, no hyperemia Neurological: no tremor with outstretched hands    Diabetic Foot Exam - Simple   No data filed       CMP ( most recent) CMP     Component Value Date/Time   NA 139 09/09/2022 1121   K 5.1 09/09/2022 1121   CL 101 09/09/2022 1121   CO2 23 09/09/2022 1121   GLUCOSE 129 (H) 09/09/2022 1121   GLUCOSE 161 (H) 09/02/2020 0451   BUN 10 09/09/2022 1121   CREATININE 0.96 09/09/2022 1121   CALCIUM 9.1 09/09/2022 1121   PROT 6.2 08/08/2022 0816   ALBUMIN 4.4 08/08/2022 0816   AST 16 08/08/2022 0816   ALT 19 08/08/2022 0816   ALKPHOS 94 08/08/2022 0816   BILITOT 0.3 08/08/2022 0816   GFRNONAA 95 09/23/2020 0000   GFRNONAA >60 09/02/2020 0451     Diabetic Labs (most  recent): Lab Results  Component Value Date   HGBA1C 8.2 (A) 01/12/2023   HGBA1C 8.0 (H) 08/08/2022   HGBA1C 7.6 (A) 05/08/2022   MICROALBUR 80mg /L  05/08/2022   MICROALBUR 10 11/15/2020     Lipid Panel ( most recent) Lipid Panel     Component Value Date/Time   CHOL 118 08/08/2022 0816   TRIG 257 (H) 08/08/2022 0816   HDL 29 (L) 08/08/2022 0816   CHOLHDL 4.1 08/08/2022 0816   CHOLHDL 3.7 03/11/2021 0756   VLDL 21 03/11/2021 0756   LDLCALC 49 08/08/2022 0816   LABVLDL 40 08/08/2022 0816      Lab Results  Component Value Date   TSH 1.620 08/08/2022   TSH 1.920 02/03/2022   TSH 1.290 11/08/2021   TSH 0.787 09/01/2020   FREET4 1.13 02/03/2022   FREET4 1.20 11/08/2021           Assessment & Plan:   1) Type 2 diabetes mellitus with hyperglycemia, with long-term current use of insulin (HCC)  He presents today with his CGM showing improved, mostly at goal glycemic profile.  His POCT  A1c today is 8.2%, increasing slightly from last visit of 8%.  Analysis of his CGM shows TIR 54%, TAR 46%, TBR 0% with a GMI of 7.6%.  He has a tendency to overcorrect for low glucose (gets alarms when it drops) and eats a honey bun to bring it back up.  - Tony Combs has currently uncontrolled symptomatic type 2 DM since 62 years of age.  -Recent labs reviewed.  - I had a long discussion with him about the progressive nature of diabetes and the pathology behind its complications. -his diabetes is complicated by CVA, CAD and he remains at a high risk for more acute and chronic complications which include CAD, CVA, CKD, retinopathy, and neuropathy. These are all discussed in detail with him.  - Nutritional counseling repeated at each appointment due to patients tendency to fall back in to old habits.  - The patient admits there is a room for improvement in their diet and drink choices. -  Suggestion is made for the patient to avoid simple carbohydrates from their diet including Cakes, Sweet  Desserts / Pastries, Ice Cream, Soda (diet and regular), Sweet Tea, Candies, Chips, Cookies, Sweet Pastries, Store Bought Juices, Alcohol in Excess of 1-2 drinks a day, Artificial Sweeteners, Coffee Creamer, and "Sugar-free" Products. This will help patient to have stable blood glucose profile and potentially avoid unintended weight gain.   - I encouraged the patient to switch to unprocessed or minimally processed complex starch and increased protein intake (animal or plant source), fruits, and vegetables.   - Patient is advised to stick to a routine mealtimes to eat 3 meals a day and avoid unnecessary snacks (to snack only to correct hypoglycemia).  - he will be scheduled with Norm Salt, RDN, CDE for diabetes education.    - I have approached him with the following individualized plan to manage his diabetes and patient agrees:   -He is advised to increase his Toujeo to 18 units SQ nightly and continue Novolog 4-10 units TID with meals if glucose is above 90 and he is eating (Specific instructions on how to titrate insulin dosage based on glucose readings given to patient in writing).    -he is encouraged to continue monitoring glucose 4 times daily (using his CGM), before meals and before bed, and to call the clinic if he has readings less than 70 or above 300 for 3 tests in a row.    - he is warned not to take insulin without proper monitoring per orders. - Adjustment parameters are given to him  for hypo and hyperglycemia in writing.  We did talk about possibly adding GLP1 medication today which would certainly reduce risk of another CVA.  However, given his history of stomach issues, we decided against it as it would likely exacerbate those symptoms.  - Specific targets for  A1c; LDL, HDL, and Triglycerides were discussed with the patient.  2) Blood Pressure /Hypertension:  his blood pressure is controlled to target.   he is advised to continue his current medications including Coreg  12.5 mg p.o. twice daily, Lasix 20 mg po daily, Losartan 50 mg po daily, and Spironolactone 25 mg po daily.  He could not afford the copay for Entresto.  3) Lipids/Hyperlipidemia:    Review of his recent lipid panel from 08/08/22 showed controlled LDL at 49 and elevated triglycerides of 257.  he is advised to continue Zetia 10 mg po daily mg daily at bedtime.  Side effects and precautions discussed with him.  4)  Weight/Diet:  his Body mass index is 36.37 kg/m.  -  clearly complicating his diabetes care.   he is a candidate for weight loss. I discussed with him the fact that loss of 5 - 10% of his  current body weight will have the most impact on his diabetes management.  Exercise, and detailed carbohydrates information provided  -  detailed on discharge instructions.  5) Vitamin D Deficiency His recent vitamin D level on 08/08/22 was 26.5.  He is currently on supplementation, is advised to continue.  6) Chronic Care/Health Maintenance: -he is on ACEI/ARB and Statin medications and is encouraged to initiate and continue to follow up with Ophthalmology, Dentist, Podiatrist at least yearly or according to recommendations, and advised to stay away from smoking. I have recommended yearly flu vaccine and pneumonia vaccine at least every 5 years; moderate intensity exercise for up to 150 minutes weekly; and sleep for at least 7 hours a day.  - he is advised to maintain close follow up with Durwin Nora, Lucina Mellow, MD for primary care needs, as well as his other providers for optimal and coordinated care.      I spent  46  minutes in the care of the patient today including review of labs from CMP, Lipids, Thyroid Function, Hematology (current and previous including abstractions from other facilities); face-to-face time discussing  his blood glucose readings/logs, discussing hypoglycemia and hyperglycemia episodes and symptoms, medications doses, his options of short and long term treatment based on the latest  standards of care / guidelines;  discussion about incorporating lifestyle medicine;  and documenting the encounter. Risk reduction counseling performed per USPSTF guidelines to reduce obesity and cardiovascular risk factors.     Please refer to Patient Instructions for Blood Glucose Monitoring and Insulin/Medications Dosing Guide"  in media tab for additional information. Please  also refer to " Patient Self Inventory" in the Media  tab for reviewed elements of pertinent patient history.  Tony Combs participated in the discussions, expressed understanding, and voiced agreement with the above plans.  All questions were answered to his satisfaction. he is encouraged to contact clinic should he have any questions or concerns prior to his return visit.     Follow up plan: - Return in about 3 months (around 04/14/2023) for Diabetes F/U with A1c in office, No previsit labs, Bring meter and logs.   Ronny Bacon, [facility] [address] [place], [place] [address] Phone: [phone] Fax: [phone]  01/12/2023, 8:56 AM

## 2023-01-24 ENCOUNTER — Other Ambulatory Visit: Payer: Self-pay | Admitting: Nurse Practitioner

## 2023-01-26 ENCOUNTER — Other Ambulatory Visit: Payer: Self-pay | Admitting: Nurse Practitioner

## 2023-01-28 ENCOUNTER — Other Ambulatory Visit: Payer: Self-pay

## 2023-01-29 ENCOUNTER — Other Ambulatory Visit: Payer: Self-pay

## 2023-01-29 MED ORDER — FREESTYLE LIBRE 2 SENSOR MISC: 3 refills | Status: DC

## 2023-02-10 ENCOUNTER — Other Ambulatory Visit: Payer: Self-pay

## 2023-02-10 DIAGNOSIS — I10 Essential (primary) hypertension: Secondary | ICD-10-CM

## 2023-02-10 MED ORDER — CARVEDILOL 6.25 MG PO TABS: ORAL_TABLET | ORAL | 1 refills | Status: DC

## 2023-02-10 MED ORDER — LOSARTAN POTASSIUM 100 MG PO TABS: 100.0000 mg | ORAL_TABLET | Freq: Every day | ORAL | 1 refills | Status: DC

## 2023-02-19 ENCOUNTER — Other Ambulatory Visit: Payer: Self-pay

## 2023-02-19 DIAGNOSIS — Z794 Long term (current) use of insulin: Secondary | ICD-10-CM

## 2023-02-19 MED ORDER — NOVOLOG FLEXPEN 100 UNIT/ML ~~LOC~~ SOPN: 4.0000 [IU] | PEN_INJECTOR | Freq: Three times a day (TID) | SUBCUTANEOUS | 0 refills | Status: DC

## 2023-02-19 MED ORDER — INSULIN GLARGINE (1 UNIT DIAL) 300 UNIT/ML ~~LOC~~ SOPN: 16.0000 [IU] | PEN_INJECTOR | Freq: Every day | SUBCUTANEOUS | 0 refills | Status: DC

## 2023-02-19 MED ORDER — FREESTYLE LIBRE 2 SENSOR MISC: 3 refills | Status: DC

## 2023-02-27 ENCOUNTER — Other Ambulatory Visit: Payer: Self-pay | Admitting: Internal Medicine

## 2023-02-27 DIAGNOSIS — I420 Dilated cardiomyopathy: Secondary | ICD-10-CM

## 2023-02-27 DIAGNOSIS — I10 Essential (primary) hypertension: Secondary | ICD-10-CM

## 2023-03-03 ENCOUNTER — Other Ambulatory Visit: Payer: Self-pay | Admitting: *Deleted

## 2023-03-03 DIAGNOSIS — E1165 Type 2 diabetes mellitus with hyperglycemia: Secondary | ICD-10-CM

## 2023-03-03 MED ORDER — NOVOLOG FLEXPEN 100 UNIT/ML ~~LOC~~ SOPN: 4.0000 [IU] | PEN_INJECTOR | Freq: Three times a day (TID) | SUBCUTANEOUS | 0 refills | Status: DC

## 2023-03-03 MED ORDER — FREESTYLE LIBRE 2 SENSOR MISC: 3 refills | Status: DC

## 2023-03-03 MED ORDER — INSULIN GLARGINE (1 UNIT DIAL) 300 UNIT/ML ~~LOC~~ SOPN: 18.0000 [IU] | PEN_INJECTOR | Freq: Every day | SUBCUTANEOUS | 0 refills | Status: DC

## 2023-03-05 ENCOUNTER — Other Ambulatory Visit: Payer: Self-pay

## 2023-03-05 DIAGNOSIS — Z794 Long term (current) use of insulin: Secondary | ICD-10-CM

## 2023-03-05 MED ORDER — INSULIN GLARGINE (1 UNIT DIAL) 300 UNIT/ML ~~LOC~~ SOPN: 18.0000 [IU] | PEN_INJECTOR | Freq: Every day | SUBCUTANEOUS | 0 refills | Status: DC

## 2023-03-06 ENCOUNTER — Telehealth: Payer: Self-pay | Admitting: *Deleted

## 2023-03-06 NOTE — Telephone Encounter (Signed)
Patient called and left a message. The message was not that clear. I did understand that insurance was not going to cover one of his insulins. I did look and see that on 03/05/23 we sent in the Toujeo to his mail order pharmacy. Patient was called and a message was left with that information. Patient was ask to please let us know if this was not the correct insulin.

## 2023-03-11 DIAGNOSIS — E119 Type 2 diabetes mellitus without complications: Secondary | ICD-10-CM | POA: Diagnosis not present

## 2023-03-13 DIAGNOSIS — I10 Essential (primary) hypertension: Secondary | ICD-10-CM | POA: Diagnosis not present

## 2023-03-14 LAB — BASIC METABOLIC PANEL
BUN/Creatinine Ratio: 11 (ref 10–24)
BUN: 10 mg/dL (ref 8–27)
CO2: 20 mmol/L (ref 20–29)
Calcium: 9 mg/dL (ref 8.6–10.2)
Chloride: 99 mmol/L (ref 96–106)
Creatinine, Ser: 0.87 mg/dL (ref 0.76–1.27)
Glucose: 173 mg/dL — ABNORMAL HIGH (ref 70–99)
Potassium: 4.6 mmol/L (ref 3.5–5.2)
Sodium: 138 mmol/L (ref 134–144)
eGFR: 96 mL/min/{1.73_m2} (ref >59)

## 2023-03-16 ENCOUNTER — Telehealth: Payer: Self-pay | Admitting: Adult Health

## 2023-03-16 ENCOUNTER — Ambulatory Visit (INDEPENDENT_AMBULATORY_CARE_PROVIDER_SITE_OTHER): Payer: Medicare Other | Admitting: Internal Medicine

## 2023-03-16 ENCOUNTER — Encounter: Payer: Self-pay | Admitting: Internal Medicine

## 2023-03-16 ENCOUNTER — Telehealth: Payer: Self-pay

## 2023-03-16 VITALS — BP 118/68 | HR 105 | Ht 68.0 in | Wt 232.4 lb

## 2023-03-16 DIAGNOSIS — I5042 Chronic combined systolic (congestive) and diastolic (congestive) heart failure: Secondary | ICD-10-CM | POA: Diagnosis not present

## 2023-03-16 DIAGNOSIS — I10 Essential (primary) hypertension: Secondary | ICD-10-CM | POA: Diagnosis not present

## 2023-03-16 DIAGNOSIS — E1169 Type 2 diabetes mellitus with other specified complication: Secondary | ICD-10-CM | POA: Diagnosis not present

## 2023-03-16 DIAGNOSIS — G7000 Myasthenia gravis without (acute) exacerbation: Secondary | ICD-10-CM

## 2023-03-16 DIAGNOSIS — G4733 Obstructive sleep apnea (adult) (pediatric): Secondary | ICD-10-CM

## 2023-03-16 DIAGNOSIS — E1165 Type 2 diabetes mellitus with hyperglycemia: Secondary | ICD-10-CM

## 2023-03-16 DIAGNOSIS — Z794 Long term (current) use of insulin: Secondary | ICD-10-CM | POA: Diagnosis not present

## 2023-03-16 DIAGNOSIS — E785 Hyperlipidemia, unspecified: Secondary | ICD-10-CM

## 2023-03-16 DIAGNOSIS — R5383 Other fatigue: Secondary | ICD-10-CM

## 2023-03-16 DIAGNOSIS — H532 Diplopia: Secondary | ICD-10-CM

## 2023-03-16 DIAGNOSIS — Z8673 Personal history of transient ischemic attack (TIA), and cerebral infarction without residual deficits: Secondary | ICD-10-CM

## 2023-03-16 NOTE — Assessment & Plan Note (Signed)
Remains euvolemic on exam.  Cardiology follow-up is scheduled for March.

## 2023-03-16 NOTE — Addendum Note (Signed)
Addended by: Judi Cong on: 03/16/2023 10:34 AM   Modules accepted: Orders

## 2023-03-16 NOTE — Telephone Encounter (Signed)
Referral has been placed for the [facility]

## 2023-03-16 NOTE — Telephone Encounter (Signed)
Referral for physical therapy fax to [facility]. Phone: [phone], Fax: [phone]

## 2023-03-16 NOTE — Assessment & Plan Note (Signed)
A1c 8.2 on labs from November 2024.  Endocrinology follow-up is scheduled for next month.

## 2023-03-16 NOTE — Telephone Encounter (Signed)
Referral for neurology fax to [facility]. Phone: [phone], Fax:[phone]

## 2023-03-16 NOTE — Assessment & Plan Note (Signed)
Inconsistent CPAP use.  Counseling importance of nightly CPAP compliance.

## 2023-03-16 NOTE — Assessment & Plan Note (Signed)
Adequately controlled on current antihypertensive regimen.  Losartan was increased to 100 mg daily at his last appointment.  No additional medication changes are indicated today.

## 2023-03-16 NOTE — Progress Notes (Signed)
Established Patient Office Visit  Subjective   Patient ID: Tony Combs, male    DOB: 18-Aug-1960  Age: 63 y.o. MRN: [id number]  Chief Complaint  Patient presents with   Hypertension    Four month follow up   [name] returns to care today for routine follow-up.  He was last evaluated by me in September 2024.  Losartan was increased to 100 mg daily at that time.  No additional medication changes were made and 63-month follow-up was arranged.  In the interim, he has been seen by neurology and endocrinology for follow-up.  Mr. [name] reports feeling fairly well.  He endorses weakness in the setting of myasthenia gravis.  Not currently scheduled for follow-up with neurology.  He does not have any acute concerns to discuss today.  Past Medical History:  Diagnosis Date   CAD in native artery 03/11/2021   Small Non-dominant RCA - prox 90% (CTA FFR 0.66) - Not favorable for PCI - small caliber, non-dominant (<2 mm) vessel.  Mininal diffuse disease in LCx.   Diabetes mellitus without complication (HCC)    Dilated cardiomyopathy (HCC) 01/17/2021   In setting of stroke: TTE 08/31/2020: EF 30 and 35%.  Mildly decreased function.  Moderate LVH.  Unable to assess diastolic function.  Unable assess RV function.  Mild LA dilation.   Hyperlipidemia    Hypertension    Myasthenia gravis (HCC) 03/01/2021   Followed by Dr. 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   Past Surgical History:  Procedure Laterality Date   Coronary CT Angiogram  01/31/2021   Coronary calcium score 40.  No CAD RADS 3.  Moderate stenosis.  RCA has moderate obstructive plaque in the mid and distal vessel.  Otherwise 25-49% OM1. => CT FFR positive, 0.66 in the RCA.   HERNIA REPAIR     LEFT HEART CATH AND CORONARY ANGIOGRAPHY N/A 03/11/2021   Procedure: LEFT HEART CATH AND CORONARY ANGIOGRAPHY;  Surgeon: [name], MD;  Location: [facility] INVASIVE CV  LAB;;   Prox RCA lesion is 90% stenosed.-Small to moderate caliber nondominant vessel (<2 mm) => not PCI amenable.  Not the cause of cardiomyopathy.   Otherwise minimal diffuse CAD and a Left Dominant System.   LV EDP is normal with Systemic Hypertension (Central Aop ~20 mmHg > BP Cuff)   TRANSTHORACIC ECHOCARDIOGRAM  08/31/2020   a) EF 30 and 35%.  Mildly decreased function.  Moderate LVH.  Unable to assess diastolic function.  Unable assess RV function.  Mild LA dilation.; b) 12/21/2020: EF remains 30 to 35% with moderately reduced function.  Global HK.  GR 1 DD.  Normal RV size and function.  Normal aortic valves.  Cannot exclude small PFO. => 02/07/21 -> NEGATIVE BUBBLE STUDY.   Social History   Tobacco Use   Smoking status: Never   Smokeless tobacco: Never  Vaping Use   Vaping status: Never Used  Substance Use Topics   Alcohol use: Not Currently   Drug use: Not Currently   Family History  Problem Relation Age of Onset   Hypertension Mother    Diabetes Mother    Heart failure Father 52   Heart attack Father 50   Hypertension Father    Diabetes type II Father    Coronary artery disease Father 72   Congestive Heart Failure Father    Diabetes Brother    No Known Allergies  Review of Systems  Neurological:  Positive for weakness.     Objective:     BP 118/68   Pulse (!) 105   Ht 5\' 8"  (1.727 m)   Wt 232 lb 6.4 oz (105.4 kg)   SpO2 96%   BMI 35.34 kg/m  BP Readings from Last 3 Encounters:  03/16/23 118/68  01/12/23 (!) 143/78  12/10/22 124/78   Physical Exam Vitals reviewed.  Constitutional:      General: He is not in acute distress.    Appearance: Normal appearance. He is obese. He is not ill-appearing.  HENT:     Head: Normocephalic and atraumatic.     Right Ear: External ear normal.     Left Ear: External ear normal.     Nose: Nose normal. No congestion or rhinorrhea.     Mouth/Throat:     Mouth: Mucous membranes are moist.     Pharynx: Oropharynx is  clear.  Eyes:     General: No scleral icterus.    Extraocular Movements: Extraocular movements intact.     Conjunctiva/sclera: Conjunctivae normal.     Pupils: Pupils are equal, round, and reactive to light.  Cardiovascular:     Rate and Rhythm: Normal rate and regular rhythm.     Pulses: Normal pulses.     Heart sounds: Normal heart sounds. No murmur heard. Pulmonary:     Effort: Pulmonary effort is normal.     Breath sounds: Normal breath sounds. No wheezing, rhonchi or rales.  Abdominal:     General: Abdomen is flat. Bowel sounds are normal. There is no distension.     Palpations: Abdomen is soft.     Tenderness: There is no abdominal tenderness.  Musculoskeletal:        General: No swelling or deformity. Normal range of motion.     Cervical back: Normal range of motion.  Skin:    General: Skin is warm and dry.     Capillary Refill: Capillary refill takes less than 2 seconds.  Neurological:     General: No focal deficit present.     Mental Status: He is alert and oriented to person, place, and time.     Motor: No weakness.  Psychiatric:        Mood and Affect: Mood normal.        Behavior: Behavior normal.        Thought Content: Thought content normal.   Last CBC Lab Results  Component Value Date   WBC 5.8 08/08/2022   HGB 12.1 (L) 08/08/2022   HCT 37.2 (L) 08/08/2022   MCV 89 08/08/2022   MCH 28.8 08/08/2022   RDW 13.7 08/08/2022   PLT 179 08/08/2022   Last metabolic panel Lab Results  Component Value Date   GLUCOSE 173 (H) 03/13/2023   NA 138 03/13/2023   K 4.6 03/13/2023   CL 99 03/13/2023   CO2 20 03/13/2023   BUN 10 03/13/2023   CREATININE 0.87 03/13/2023   EGFR 96 03/13/2023   CALCIUM 9.0 03/13/2023   PHOS 4.0 09/01/2020   PROT 6.2 08/08/2022   ALBUMIN 4.4 08/08/2022   LABGLOB 1.8 08/08/2022   AGRATIO 1.6 02/03/2022   BILITOT 0.3 08/08/2022   ALKPHOS 94 08/08/2022   AST 16 08/08/2022   ALT 19 08/08/2022   ANIONGAP 5 09/02/2020   Last  lipids Lab Results  Component Value Date   CHOL 118 08/08/2022   HDL 29 (L) 08/08/2022   LDLCALC 49 08/08/2022   TRIG 257 (H) 08/08/2022  CHOLHDL 4.1 08/08/2022   Last hemoglobin A1c Lab Results  Component Value Date   HGBA1C 8.2 (A) 01/12/2023   Last thyroid functions Lab Results  Component Value Date   TSH 1.620 08/08/2022   Last vitamin D Lab Results  Component Value Date   VD25OH 26.5 (L) 08/08/2022     Assessment & Plan:   Problem List Items Addressed This Visit       Essential hypertension - Primary (Chronic)   Adequately controlled on current antihypertensive regimen.  Losartan was increased to 100 mg daily at his last appointment.  No additional medication changes are indicated today.      Chronic combined systolic and diastolic heart failure (HCC) (Chronic)   Remains euvolemic on exam.  Cardiology follow-up is scheduled for March.      OSA (obstructive sleep apnea)   Inconsistent CPAP use.  Counseling importance of nightly CPAP compliance.      Hyperlipidemia associated with type 2 diabetes mellitus (HCC) (Chronic)   Currently prescribed Zetia 10 mg daily.  Lipid panel last updated in June 2024.  No medication changes are indicated today.      Type 2 diabetes mellitus with hyperglycemia (HCC)   A1c 8.2 on labs from November 2024.  Endocrinology follow-up is scheduled for next month.      Myasthenia gravis (HCC)   He endorses weakness today.  Last seen by neurology in October 2024. He has failed multiple prior treatments. Last received Vygart infusion in June 2024 without significant benefit.  Per review of documentation from October, additional treatment options were to be discussed with Dr. Pearlean Brownie and then the patient would be contacted to further discuss.  Mr. Weller reports today that he was never contacted to discuss additional treatment options.  No documented telephone encounters noted since his last appointment.  During his appointment today, our  office contacted neurology, who stated the patient should be referred to a tertiary care center to discuss further treatment since he has failed multiple prior treatments.  He has been referred to [facility].        Return in about 3 months (around 06/14/2023).    Billie Lade, MD

## 2023-03-16 NOTE — Assessment & Plan Note (Signed)
He endorses weakness today.  Last seen by neurology in October 2024. He has failed multiple prior treatments. Last received Vygart infusion in June 2024 without significant benefit.  Per review of documentation from October, additional treatment options were to be discussed with Dr. Pearlean Brownie and then the patient would be contacted to further discuss.  Tony Combs reports today that he was never contacted to discuss additional treatment options.  No documented telephone encounters noted since his last appointment.  During his appointment today, our office contacted neurology, who stated the patient should be referred to a tertiary care center to discuss further treatment since he has failed multiple prior treatments.  He has been referred to [facility].

## 2023-03-16 NOTE — Assessment & Plan Note (Signed)
Currently prescribed Zetia 10 mg daily.  Lipid panel last updated in June 2024.  No medication changes are indicated today.

## 2023-03-16 NOTE — Patient Instructions (Signed)
It was a pleasure to see you today.  Thank you for giving [place] the opportunity to be involved in your care.  Below is a brief recap of your visit and next steps.  We will plan to see you again in 3 months.  Summary No medication changes today We will follow up in 3 months

## 2023-03-16 NOTE — Telephone Encounter (Signed)
Patients PCP office called today to get him scheduled for a follow up appointment. I was able to speak with Shanda Bumps, NP who advised that Dr Pearlean Brownie actually recommended a referral to an academic center for further recommendations. She advised that all medications we tried did not work, or we were unable to prescribe d/t other health conditions or insurance would not cover. The PCP office is asking if we are able to facilitate that referral for him. They stated he does not have any preferences location wise for this referral.

## 2023-03-16 NOTE — Telephone Encounter (Signed)
Yes, that would be great. Thank you

## 2023-03-23 ENCOUNTER — Other Ambulatory Visit: Payer: Self-pay

## 2023-03-23 DIAGNOSIS — I10 Essential (primary) hypertension: Secondary | ICD-10-CM

## 2023-03-23 DIAGNOSIS — I420 Dilated cardiomyopathy: Secondary | ICD-10-CM

## 2023-03-23 MED ORDER — CARVEDILOL 6.25 MG PO TABS: ORAL_TABLET | ORAL | 1 refills | Status: DC

## 2023-03-23 MED ORDER — LOSARTAN POTASSIUM 50 MG PO TABS: 50.0000 mg | ORAL_TABLET | Freq: Every day | ORAL | 5 refills | Status: DC

## 2023-03-23 MED ORDER — FUROSEMIDE 20 MG PO TABS: 20.0000 mg | ORAL_TABLET | Freq: Every day | ORAL | 1 refills | Status: DC

## 2023-03-23 MED ORDER — EZETIMIBE 10 MG PO TABS: 10.0000 mg | ORAL_TABLET | Freq: Every day | ORAL | 1 refills | Status: DC

## 2023-03-23 MED ORDER — SPIRONOLACTONE 25 MG PO TABS: 12.5000 mg | ORAL_TABLET | Freq: Two times a day (BID) | ORAL | 1 refills | Status: DC

## 2023-03-25 ENCOUNTER — Telehealth: Payer: Self-pay | Admitting: Internal Medicine

## 2023-03-25 NOTE — Telephone Encounter (Signed)
Pt's medication was already sent to pt's pharmacy as requested. Confirmation received.  

## 2023-03-25 NOTE — Telephone Encounter (Signed)
*  STAT* If patient is at the pharmacy, call can be transferred to refill team.   1. Which medications need to be refilled? (please list name of each medication and dose if known) ezetimibe  (ZETIA ) 10 MG tablet    2. Would you like to learn more about the convenience, safety, & potential cost savings by using the [facility]?      3. Are you open to using the [facility] (Type [facility]. ).   4. Which pharmacy/location (including street and city if local pharmacy) is medication to be sent to?[facility] - [place], [place] - [address]    5. Do they need a 30 day or 90 day supply? 90

## 2023-03-27 ENCOUNTER — Telehealth: Payer: Self-pay

## 2023-03-27 NOTE — Telephone Encounter (Signed)
 Copied from CRM [id number]. Topic: Clinical - Prescription Issue >> Mar 27, 2023  9:39 AM Tony Combs wrote: Reason for CRM: Patient is confused because he picked up his losartan  and it is only 50mg , normally he takes 100mg . Please advise patient on this error

## 2023-03-27 NOTE — Telephone Encounter (Signed)
Unable to speak to patient

## 2023-04-14 ENCOUNTER — Ambulatory Visit: Payer: Medicare Other | Admitting: Nurse Practitioner

## 2023-04-14 ENCOUNTER — Encounter: Payer: Self-pay | Admitting: Nurse Practitioner

## 2023-04-14 VITALS — BP 128/80 | HR 92 | Ht 68.0 in | Wt 235.6 lb

## 2023-04-14 DIAGNOSIS — I10 Essential (primary) hypertension: Secondary | ICD-10-CM

## 2023-04-14 DIAGNOSIS — Z794 Long term (current) use of insulin: Secondary | ICD-10-CM

## 2023-04-14 DIAGNOSIS — E1165 Type 2 diabetes mellitus with hyperglycemia: Secondary | ICD-10-CM | POA: Diagnosis not present

## 2023-04-14 DIAGNOSIS — E782 Mixed hyperlipidemia: Secondary | ICD-10-CM

## 2023-04-14 LAB — POCT GLYCOSYLATED HEMOGLOBIN (HGB A1C): Hemoglobin A1C: 8.5 % — AB (ref 4.0–5.6)

## 2023-04-14 MED ORDER — NOVOLOG FLEXPEN 100 UNIT/ML ~~LOC~~ SOPN: 4.0000 [IU] | PEN_INJECTOR | Freq: Three times a day (TID) | SUBCUTANEOUS | 3 refills | Status: DC

## 2023-04-14 MED ORDER — INSULIN GLARGINE (1 UNIT DIAL) 300 UNIT/ML ~~LOC~~ SOPN: 16.0000 [IU] | PEN_INJECTOR | Freq: Every day | SUBCUTANEOUS | 3 refills | Status: DC

## 2023-04-14 NOTE — Progress Notes (Signed)
 Endocrinology Follow Up Note       04/14/2023, 8:53 AM   Subjective:    Patient ID: Tony Combs, male    DOB: Sep 23, 1960.  Tony Combs is being seen in follow up after being seen in consultation for management of currently uncontrolled symptomatic diabetes requested by  Billie Lade, MD.   Past Medical History:  Diagnosis Date   CAD in native artery 03/11/2021   Small Non-dominant RCA - prox 90% (CTA FFR 0.66) - Not favorable for PCI - small caliber, non-dominant (<2 mm) vessel.  Mininal diffuse disease in LCx.   Diabetes mellitus without complication (HCC)    Dilated cardiomyopathy (HCC) 01/17/2021   In setting of stroke: TTE 08/31/2020: EF 30 and 35%.  Mildly decreased function.  Moderate LVH.  Unable to assess diastolic function.  Unable assess RV function.  Mild LA dilation.   Hyperlipidemia    Hypertension    Myasthenia gravis (HCC) 03/01/2021   Followed by Dr. 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    Past Surgical History:  Procedure Laterality Date   Coronary CT Angiogram  01/31/2021   Coronary calcium score 40.  No CAD RADS 3.  Moderate stenosis.  RCA has moderate obstructive plaque in the mid and distal vessel.  Otherwise 25-49% OM1. => CT FFR positive, 0.66 in the RCA.   HERNIA REPAIR     LEFT HEART CATH AND CORONARY ANGIOGRAPHY N/A 03/11/2021   Procedure: LEFT HEART CATH AND CORONARY ANGIOGRAPHY;  Surgeon: [name], MD;  Location: [facility] INVASIVE CV LAB;;   Prox RCA lesion is 90% stenosed.-Small to moderate caliber nondominant vessel (<2 mm) => not PCI amenable.  Not the cause of cardiomyopathy.   Otherwise minimal diffuse CAD and a Left Dominant System.   LV EDP is normal with Systemic Hypertension (Central Aop ~20 mmHg > BP Cuff)   TRANSTHORACIC ECHOCARDIOGRAM  08/31/2020   a) EF 30 and 35%.  Mildly decreased  function.  Moderate LVH.  Unable to assess diastolic function.  Unable assess RV function.  Mild LA dilation.; b) 12/21/2020: EF remains 30 to 35% with moderately reduced function.  Global HK.  GR 1 DD.  Normal RV size and function.  Normal aortic valves.  Cannot exclude small PFO. => 02/07/21 -> NEGATIVE BUBBLE STUDY.    Social History   Socioeconomic History   Marital status: Married    Spouse name: Not on file   Number of children: Not on file   Years of education: Not on file   Highest education level: Not on file  Occupational History   Not on file  Tobacco Use   Smoking status: Never   Smokeless tobacco: Never  Vaping Use   Vaping status: Never Used  Substance and Sexual Activity   Alcohol use: Not Currently   Drug use: Not Currently   Sexual activity: Not Currently  Other Topics Concern   Not on file  Social History Narrative   Not on file   Social Drivers of Health   Financial Resource Strain: Not on file  Food Insecurity: Not on file  Transportation Needs: Not on file  Physical Activity: Not on file  Stress: Not on file  Social Connections: Not on file    Family History  Problem Relation Age of Onset   Hypertension Mother    Diabetes Mother    Heart failure Father 72   Heart attack Father 17   Hypertension Father    Diabetes type II Father    Coronary artery disease Father 12   Congestive Heart Failure Father    Diabetes Brother     Outpatient Encounter Medications as of 04/14/2023  Medication Sig   aspirin EC 81 MG tablet Take 81 mg by mouth in the morning. Swallow whole.   B-D UF III MINI PEN NEEDLES 31G X 5 MM MISC SMARTSIG:1 Pre-Filled Pen Syringe SUB-Q 4 Times Daily   carvedilol (COREG) 6.25 MG tablet TAKE 2 TABLETS BY MOUTH 2 TIMES DAILY WITH A MEAL.   Continuous Glucose Sensor (FREESTYLE LIBRE 2 SENSOR) MISC Use to monitor glucose continuously E11.65   ezetimibe (ZETIA) 10 MG tablet Take 1 tablet (10 mg total) by mouth daily.   furosemide (LASIX)  20 MG tablet Take 1 tablet (20 mg total) by mouth daily.   Insulin Pen Needle (PEN NEEDLES) 30G X 5 MM MISC 1 pen by Does not apply route 4 (four) times daily - after meals and at bedtime.   losartan (COZAAR) 100 MG tablet Take 1 tablet (100 mg total) by mouth daily.   spironolactone (ALDACTONE) 25 MG tablet Take 0.5 tablets (12.5 mg total) by mouth 2 (two) times daily.   [DISCONTINUED] insulin glargine, 1 Unit Dial, (TOUJEO) 300 UNIT/ML Solostar Pen Inject 18 Units into the skin at bedtime.   [DISCONTINUED] NOVOLOG FLEXPEN 100 UNIT/ML FlexPen Inject 4-10 Units into the skin 3 (three) times daily with meals.   insulin glargine, 1 Unit Dial, (TOUJEO) 300 UNIT/ML Solostar Pen Inject 16 Units into the skin at bedtime.   NOVOLOG FLEXPEN 100 UNIT/ML FlexPen Inject 4-10 Units into the skin 3 (three) times daily with meals.   Vitamin D, Ergocalciferol, (DRISDOL) 1.25 MG (50000 UNIT) CAPS capsule Take 1 capsule (50,000 Units total) by mouth every 7 (seven) days. (Patient not taking: Reported on 04/14/2023)   [DISCONTINUED] losartan (COZAAR) 50 MG tablet Take 1 tablet (50 mg total) by mouth daily. (Patient not taking: Reported on 04/14/2023)   Facility-Administered Encounter Medications as of 04/14/2023  Medication   sodium chloride flush (NS) 0.9 % injection 10 mL    ALLERGIES: No Known Allergies  VACCINATION STATUS: Immunization History  Administered Date(s) Administered   Tdap 11/28/2021   Zoster Recombinant(Shingrix) 11/28/2021, 02/06/2022    Diabetes He presents for his follow-up diabetic visit. He has type 2 diabetes mellitus. Onset time: Diagnosed at approx age of 60. His disease course has been stable. There are no hypoglycemic associated symptoms. Pertinent negatives for diabetes include no fatigue. There are no hypoglycemic complications. Symptoms are stable. Diabetic complications include a CVA and heart disease. Risk factors for coronary artery disease include diabetes mellitus,  dyslipidemia, family history, male sex, obesity, hypertension and sedentary lifestyle. Current diabetic treatment includes intensive insulin program. He is compliant with treatment most of the time. His weight is fluctuating minimally. He is following a generally healthy diet. When asked about meal planning, he reported none. He has not had a previous visit with a dietitian. He rarely participates in exercise. His home blood glucose trend is decreasing steadily. His overall blood glucose range is 140-180 mg/dl. (He presents today with his CGM  showing improved, mostly at goal glycemic profile.  His POCT A1c today is 8.5%, increasing slightly from last visit of 8.2%.  Analysis of his CGM shows TIR 59%, TAR 41%, TBR 0%.  He has a tendency to overcorrect for low glucose (gets alarms when it drops). ) An ACE inhibitor/angiotensin II receptor blocker is being taken. He does not see a podiatrist.Eye exam is current.  Hypertension This is a chronic problem. The current episode started more than 1 year ago. The problem has been resolved since onset. The problem is controlled. There are no associated agents to hypertension. Risk factors for coronary artery disease include diabetes mellitus, dyslipidemia, family history, obesity, male gender and sedentary lifestyle. Past treatments include beta blockers, diuretics and angiotensin blockers. The current treatment provides significant improvement. There are no compliance problems.  Hypertensive end-organ damage includes CAD/MI and CVA.  Hyperlipidemia This is a chronic problem. The current episode started more than 1 year ago. The problem is controlled. Recent lipid tests were reviewed and are normal. Exacerbating diseases include diabetes and obesity. Factors aggravating his hyperlipidemia include beta blockers and fatty foods. Current antihyperlipidemic treatment includes statins. The current treatment provides moderate improvement of lipids. Compliance problems include  adherence to diet and adherence to exercise.  Risk factors for coronary artery disease include diabetes mellitus, dyslipidemia, family history, hypertension, male sex, obesity and a sedentary lifestyle.     Review of systems  Constitutional: + Minimally fluctuating body weight, current Body mass index is 35.82 kg/m., + fatigue, no subjective hyperthermia, no subjective hypothermia Eyes: no blurry vision, no xerophthalmia ENT: no sore throat, no nodules palpated in throat, + dysphagia from previous stroke, no hoarseness Cardiovascular: no chest pain, no shortness of breath, no palpitations, no leg swelling Respiratory: no cough, no shortness of breath Gastrointestinal: no nausea/vomiting/diarrhea Musculoskeletal: no muscle/joint aches Skin: no rashes, no hyperemia Neurological: no tremors, no numbness, no tingling, no dizziness Psychiatric: no depression, no anxiety  Objective:     BP 128/80 (BP Location: Right Arm, Patient Position: Sitting, Cuff Size: Large)   Pulse 92   Ht 5\' 8"  (1.727 m)   Wt 235 lb 9.6 oz (106.9 kg)   BMI 35.82 kg/m   Wt Readings from Last 3 Encounters:  04/14/23 235 lb 9.6 oz (106.9 kg)  03/16/23 232 lb 6.4 oz (105.4 kg)  01/12/23 239 lb 3.2 oz (108.5 kg)     BP Readings from Last 3 Encounters:  04/14/23 128/80  03/16/23 118/68  01/12/23 (!) 143/78       Physical Exam- Limited  Constitutional:  Body mass index is 35.82 kg/m. , not in acute distress, normal state of mind Eyes:  EOMI, no exophthalmos Musculoskeletal: no gross deformities, strength intact in all four extremities, no gross restriction of joint movements Skin:  no rashes, no hyperemia Neurological: no tremor with outstretched hands    Diabetic Foot Exam - Simple   No data filed       CMP ( most recent) CMP     Component Value Date/Time   NA 138 03/13/2023 0836   K 4.6 03/13/2023 0836   CL 99 03/13/2023 0836   CO2 20 03/13/2023 0836   GLUCOSE 173 (H) 03/13/2023 0836    GLUCOSE 161 (H) 09/02/2020 0451   BUN 10 03/13/2023 0836   CREATININE 0.87 03/13/2023 0836   CALCIUM 9.0 03/13/2023 0836   PROT 6.2 08/08/2022 0816   ALBUMIN 4.4 08/08/2022 0816   AST 16 08/08/2022 0816   ALT 19 08/08/2022 0816  ALKPHOS 94 08/08/2022 0816   BILITOT 0.3 08/08/2022 0816   GFRNONAA 95 09/23/2020 0000   GFRNONAA >60 09/02/2020 0451     Diabetic Labs (most recent): Lab Results  Component Value Date   HGBA1C 8.5 (A) 04/14/2023   HGBA1C 8.2 (A) 01/12/2023   HGBA1C 8.0 (H) 08/08/2022   MICROALBUR 80mg /L 05/08/2022   MICROALBUR 10 11/15/2020     Lipid Panel ( most recent) Lipid Panel     Component Value Date/Time   CHOL 118 08/08/2022 0816   TRIG 257 (H) 08/08/2022 0816   HDL 29 (L) 08/08/2022 0816   CHOLHDL 4.1 08/08/2022 0816   CHOLHDL 3.7 03/11/2021 0756   VLDL 21 03/11/2021 0756   LDLCALC 49 08/08/2022 0816   LABVLDL 40 08/08/2022 0816      Lab Results  Component Value Date   TSH 1.620 08/08/2022   TSH 1.920 02/03/2022   TSH 1.290 11/08/2021   TSH 0.787 09/01/2020   FREET4 1.13 02/03/2022   FREET4 1.20 11/08/2021           Assessment & Plan:   1) Type 2 diabetes mellitus with hyperglycemia, with long-term current use of insulin (HCC)  He presents today with his CGM showing improved, mostly at goal glycemic profile.  His POCT A1c today is 8.5%, increasing slightly from last visit of 8.2%.  Analysis of his CGM shows TIR 59%, TAR 41%, TBR 0%.  He has a tendency to overcorrect for low glucose (gets alarms when it drops).   - Tony Combs has currently uncontrolled symptomatic type 2 DM since 63 years of age.  -Recent labs reviewed.  - I had a long discussion with him about the progressive nature of diabetes and the pathology behind its complications. -his diabetes is complicated by CVA, CAD and he remains at a high risk for more acute and chronic complications which include CAD, CVA, CKD, retinopathy, and neuropathy. These are all  discussed in detail with him.  - Nutritional counseling repeated at each appointment due to patients tendency to fall back in to old habits.  - The patient admits there is a room for improvement in their diet and drink choices. -  Suggestion is made for the patient to avoid simple carbohydrates from their diet including Cakes, Sweet Desserts / Pastries, Ice Cream, Soda (diet and regular), Sweet Tea, Candies, Chips, Cookies, Sweet Pastries, Store Bought Juices, Alcohol in Excess of 1-2 drinks a day, Artificial Sweeteners, Coffee Creamer, and "Sugar-free" Products. This will help patient to have stable blood glucose profile and potentially avoid unintended weight gain.   - I encouraged the patient to switch to unprocessed or minimally processed complex starch and increased protein intake (animal or plant source), fruits, and vegetables.   - Patient is advised to stick to a routine mealtimes to eat 3 meals a day and avoid unnecessary snacks (to snack only to correct hypoglycemia).  - he will be scheduled with Norm Salt, RDN, CDE for diabetes education.    - I have approached him with the following individualized plan to manage his diabetes and patient agrees:   -He is advised to lower his Toujeo slightly to 16 units SQ nightly and continue Novolog 4-10 units TID with meals if glucose is above 90 and he is eating (Specific instructions on how to titrate insulin dosage based on glucose readings given to patient in writing).    -he is encouraged to continue monitoring glucose 4 times daily (using his CGM), before meals and before bed,  and to call the clinic if he has readings less than 70 or above 300 for 3 tests in a row.    - he is warned not to take insulin without proper monitoring per orders. - Adjustment parameters are given to him for hypo and hyperglycemia in writing.  We did talk about possibly adding GLP1 medication today which would certainly reduce risk of another CVA.  However,  given his history of stomach issues, we decided against it as it would likely exacerbate those symptoms.  - Specific targets for  A1c; LDL, HDL, and Triglycerides were discussed with the patient.  2) Blood Pressure /Hypertension:  his blood pressure is controlled to target.   he is advised to continue his current medications including Coreg 12.5 mg p.o. twice daily, Lasix 20 mg po daily, Losartan 50 mg po daily, and Spironolactone 25 mg po daily.  He could not afford the copay for Entresto.  3) Lipids/Hyperlipidemia:    Review of his recent lipid panel from 08/08/22 showed controlled LDL at 49 and elevated triglycerides of 257.  he is advised to continue Zetia 10 mg po daily mg daily at bedtime.  Side effects and precautions discussed with him.  Will defer testing to his PCP, which he has appt coming up.  4)  Weight/Diet:  his Body mass index is 35.82 kg/m.  -  clearly complicating his diabetes care.   he is a candidate for weight loss. I discussed with him the fact that loss of 5 - 10% of his  current body weight will have the most impact on his diabetes management.  Exercise, and detailed carbohydrates information provided  -  detailed on discharge instructions.  5) Vitamin D Deficiency His recent vitamin D level on 08/08/22 was 26.5.  He is currently on supplementation, is advised to continue.  6) Chronic Care/Health Maintenance: -he is on ACEI/ARB and Statin medications and is encouraged to initiate and continue to follow up with Ophthalmology, Dentist, Podiatrist at least yearly or according to recommendations, and advised to stay away from smoking. I have recommended yearly flu vaccine and pneumonia vaccine at least every 5 years; moderate intensity exercise for up to 150 minutes weekly; and sleep for at least 7 hours a day.  - he is advised to maintain close follow up with Durwin Nora, Lucina Mellow, MD for primary care needs, as well as his other providers for optimal and coordinated care.     I  spent  39  minutes in the care of the patient today including review of labs from CMP, Lipids, Thyroid Function, Hematology (current and previous including abstractions from other facilities); face-to-face time discussing  his blood glucose readings/logs, discussing hypoglycemia and hyperglycemia episodes and symptoms, medications doses, his options of short and long term treatment based on the latest standards of care / guidelines;  discussion about incorporating lifestyle medicine;  and documenting the encounter. Risk reduction counseling performed per USPSTF guidelines to reduce obesity and cardiovascular risk factors.     Please refer to Patient Instructions for Blood Glucose Monitoring and Insulin/Medications Dosing Guide"  in media tab for additional information. Please  also refer to " Patient Self Inventory" in the Media  tab for reviewed elements of pertinent patient history.  Tony Combs participated in the discussions, expressed understanding, and voiced agreement with the above plans.  All questions were answered to his satisfaction. he is encouraged to contact clinic should he have any questions or concerns prior to his return visit.  Follow up plan: - Return in about 3 months (around 07/12/2023) for Diabetes F/U with A1c in office, No previsit labs, Bring meter and logs.   Ronny Bacon, [facility] [address] [place], [place] [address] Phone: [phone] Fax: [phone]  04/14/2023, 8:53 AM

## 2023-04-23 IMAGING — CT CT HEART MORP W/ CTA COR W/ SCORE W/ CA W/CM &/OR W/O CM
4 of 7 series · 8 of 20 positions shown, 9 images · IV contrast (APPLIED)
Comparison: None.
COMPARISON: None.

Addendum:
EXAM:
OVER-READ INTERPRETATION  CT CHEST

The following report is an over-read performed by radiologist Dr.
Bzhar Moda [REDACTED] on 01/31/2021. This
over-read does not include interpretation of cardiac or coronary
anatomy or pathology. The coronary calcium score/coronary CTA
interpretation by the cardiologist is attached.
CLINICAL DATA: 60 Year old White Male
Cardiac/Coronary  CTA
TECHNIQUE: The patient was scanned on a [employer] Force scanner.

[Series 6: best diast · axial · 0.39mm/px · z∈[+1703,+1744]mm · 2 of 308 slices shown, 3 images]
[im 103/308  vessel]
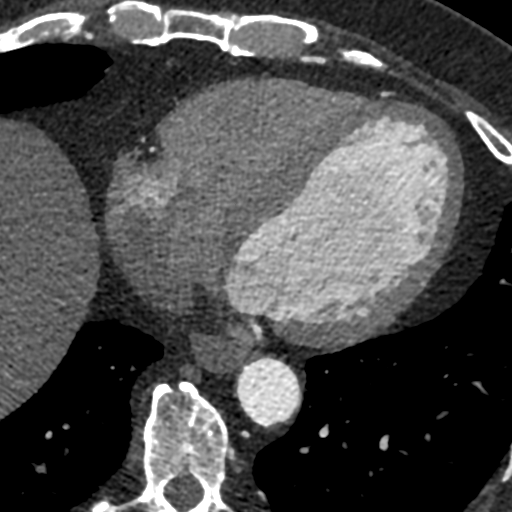
[im 103/308  lung]
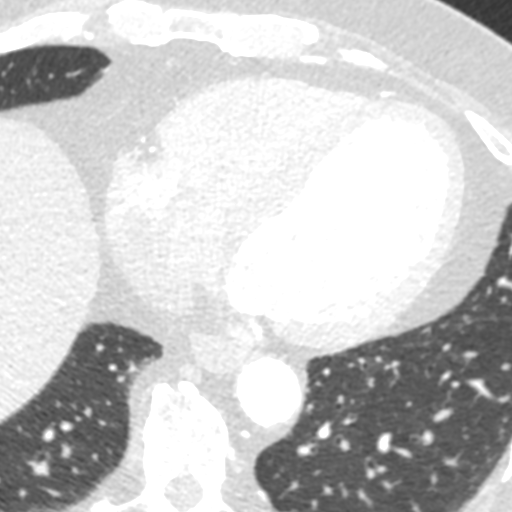
[im 205/308  vessel]
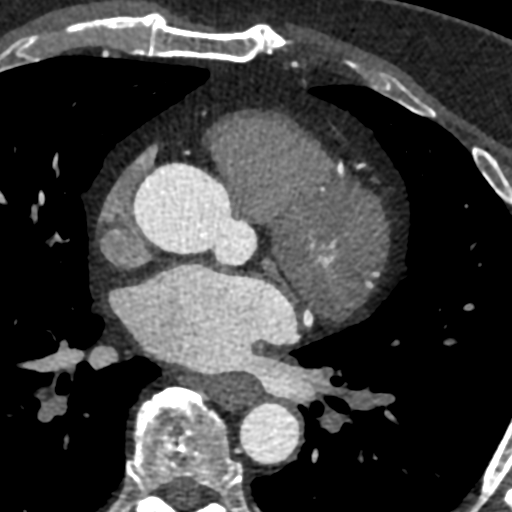

[Series 7: best syst · axial · 0.39mm/px · z∈[+1703,+1744]mm · 2 of 308 slices shown]
[im 103/308  vessel]
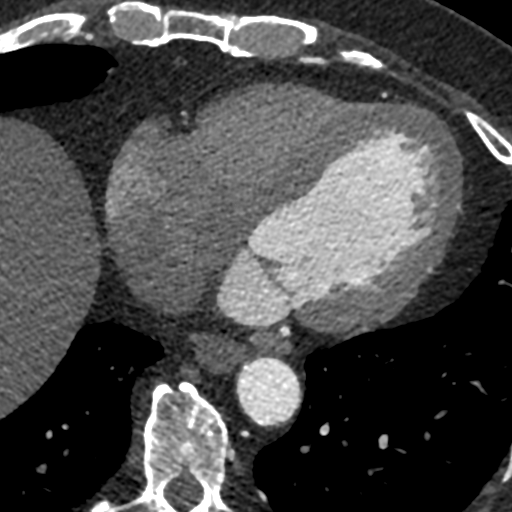
[im 205/308  vessel]
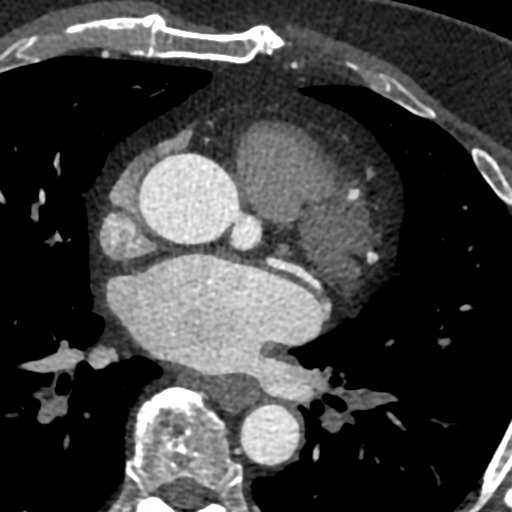

[Series 8: ts diast · axial · 0.39mm/px · z∈[+1703,+1744]mm · 2 of 308 slices shown]
[im 103/308  vessel]
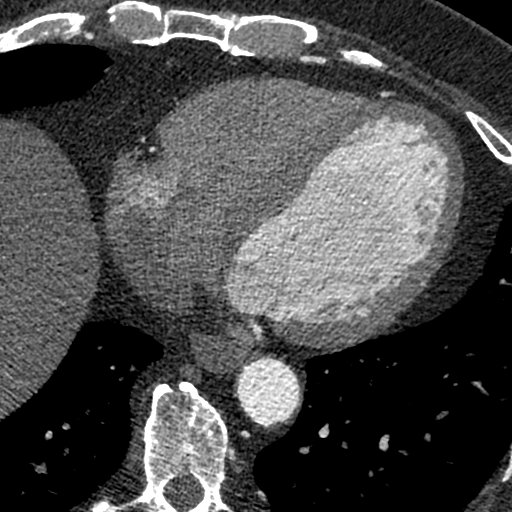
[im 205/308  vessel]
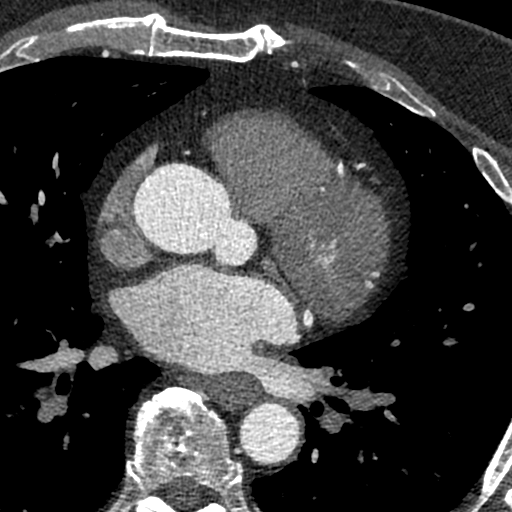

[Series 9: ts syst · axial · 0.39mm/px · z∈[+1703,+1744]mm · 2 of 308 slices shown]
[im 103/308  vessel]
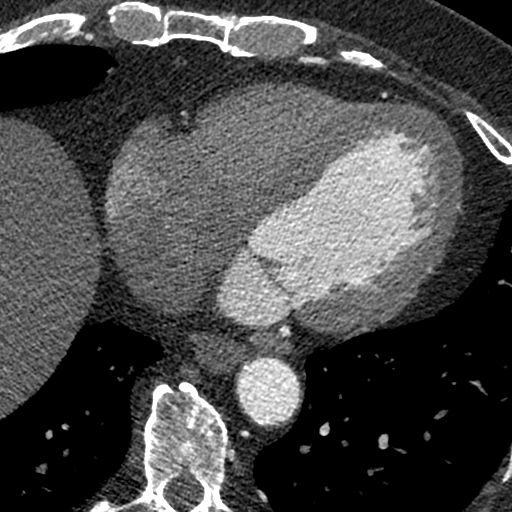
[im 205/308  vessel]
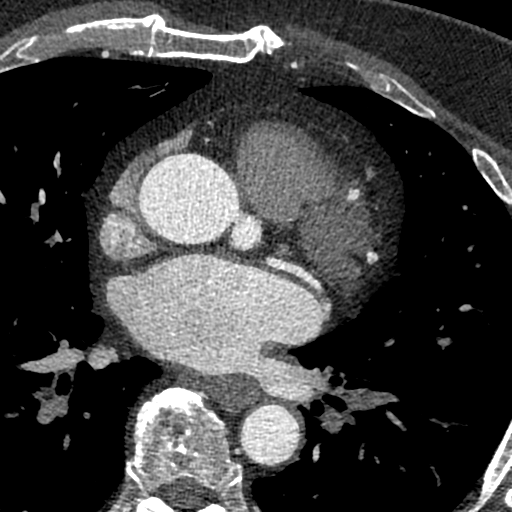

[8 of 20 positions shown; findings below may reference images not displayed]

FINDINGS: Atherosclerotic calcifications in the thoracic aorta. Within the
visualized portions of the thorax there are no suspicious appearing
pulmonary nodules or masses, there is no acute consolidative
airspace disease, no pleural effusions, no pneumothorax and no
lymphadenopathy. Visualized portions of the upper abdomen are
unremarkable. There are no aggressive appearing lytic or blastic
lesions noted in the visualized portions of the skeleton.
IMPRESSION: 1.  Aortic Atherosclerosis (YLJOV-7GP.P).
FINDINGS: Scan was triggered in the descending thoracic aorta. Axial
non-contrast 3 mm slices were carried out through the heart. The
data set was analyzed on a dedicated work station and scored using
the Agatson method. Gantry rotation speed was 250 msecs and
collimation was .6 mm. 0.8 mg of sl NTG was given. The 3D data set
was reconstructed in 5% intervals of the 67-82 % of the R-R cycle.
Diastolic phases were analyzed on a dedicated work station using
MPR, MIP and VRT modes. The patient received 95 cc of contrast.

Aorta:  Normal size.  Aortic atherosclerosis.  No dissection.

Main Pulmonary Artery: Normal size of the pulmonary artery.

Aortic Valve:  Tri-leaflet.  No calcifications.

Coronary Arteries:  Normal coronary origin.  Co-dominance.

Coronary Calcium Score:

Left main: 0

Left anterior descending artery: 0

Left circumflex artery: 40

Right coronary artery: 0

Total: 40

Percentile: 49th for age, sex, and race matched control.

RCA is a large co-dominant artery that gives rise to PDA and PLA.
There is a moderate obstructive soft plaque in the mid and distal
vessel.

Left main is a large artery that gives rise to LAD and LCX arteries.
There is no significant plaque.

LAD is a large vessel that gives rise to one small D1 vessel and one
large D2 Branch. Minimal soft plaque (1-24%) in the proximal vessel.

LCX is a co-dominant artery that gives rise to one large OM1 branch
that bifurcates and supplies a ramus distribution and multiple small
OM vessels. Mild mixed plaque (25-49%) in the mid vessel.

Other findings:

Normal pulmonary vein drainage into the left atrium.

Normal left atrial appendage without a thrombus.

Extra-cardiac findings: See attached radiology report for
non-cardiac structures.
IMPRESSION: 1. Coronary calcium score of 40. This was 49th percentile for age,
sex, and race matched control.

2. Normal coronary origin with co-dominance.

3. CAD-RADS 3. Moderate stenosis. Consider symptom-guided
anti-ischemic pharmacotherapy as well as risk factor modification
per guideline directed care. Additional analysis with CT FFR will be
submitted

4.  Aortic atherosclerosis.

RECOMMENDATIONS:



If CAC = 0, it is reasonable to withhold statin therapy and reassess
in 5 to 10 years, as long as higher risk conditions are absent
(diabetes mellitus, family history of premature CHD in first degree
relatives (males <55 years; females <65 years), cigarette smoking,
LDL >=190 mg/dL or other independent risk factors).

If CAC is 1 to 99, it is reasonable to initiate statin therapy for
patients >=55 years of age.

If CAC is >=100 or >=75th percentile, it is reasonable to initiate
statin therapy at any age.

Cardiology referral should be considered for patients with CAC
scores =400 or >=75th percentile.

*3789 AHA/ACC/AACVPR/AAPA/ABC/BEREKET/DIEU/HILKKA MARJATTA/[place]ryant/HAU/KISHORE/BUECHSEL
Guideline on the Management of Blood Cholesterol: A Report of the
American College of Cardiology/American Heart Association Task Force
on Clinical Practice Guidelines. J Am Coll Cardiol.
5135;73(24):6290-6691.

*** End of Addendum ***
EXAM:
OVER-READ INTERPRETATION  CT CHEST

The following report is an over-read performed by radiologist Dr.
Bzhar Moda [REDACTED] on 01/31/2021. This
over-read does not include interpretation of cardiac or coronary
anatomy or pathology. The coronary calcium score/coronary CTA
interpretation by the cardiologist is attached.
FINDINGS: Atherosclerotic calcifications in the thoracic aorta. Within the
visualized portions of the thorax there are no suspicious appearing
pulmonary nodules or masses, there is no acute consolidative
airspace disease, no pleural effusions, no pneumothorax and no
lymphadenopathy. Visualized portions of the upper abdomen are
unremarkable. There are no aggressive appearing lytic or blastic
lesions noted in the visualized portions of the skeleton.
IMPRESSION: 1.  Aortic Atherosclerosis (YLJOV-7GP.P).

## 2023-04-30 ENCOUNTER — Other Ambulatory Visit: Payer: Self-pay | Admitting: Internal Medicine

## 2023-04-30 DIAGNOSIS — Z23 Encounter for immunization: Secondary | ICD-10-CM

## 2023-04-30 MED ORDER — MEASLES, MUMPS & RUBELLA VAC IJ SOLR: INTRAMUSCULAR | 0 refills | Status: DC

## 2023-05-20 DIAGNOSIS — G7000 Myasthenia gravis without (acute) exacerbation: Secondary | ICD-10-CM | POA: Diagnosis not present

## 2023-05-29 ENCOUNTER — Ambulatory Visit: Payer: Medicare Other | Attending: Cardiology | Admitting: Internal Medicine

## 2023-05-29 VITALS — BP 140/78 | HR 68 | Ht 68.0 in | Wt 236.0 lb

## 2023-05-29 DIAGNOSIS — E785 Hyperlipidemia, unspecified: Secondary | ICD-10-CM | POA: Diagnosis not present

## 2023-05-29 DIAGNOSIS — D509 Iron deficiency anemia, unspecified: Secondary | ICD-10-CM | POA: Diagnosis not present

## 2023-05-29 DIAGNOSIS — I5042 Chronic combined systolic (congestive) and diastolic (congestive) heart failure: Secondary | ICD-10-CM | POA: Diagnosis not present

## 2023-05-29 DIAGNOSIS — E1169 Type 2 diabetes mellitus with other specified complication: Secondary | ICD-10-CM | POA: Diagnosis not present

## 2023-05-29 DIAGNOSIS — I700 Atherosclerosis of aorta: Secondary | ICD-10-CM | POA: Diagnosis not present

## 2023-05-29 DIAGNOSIS — I25118 Atherosclerotic heart disease of native coronary artery with other forms of angina pectoris: Secondary | ICD-10-CM

## 2023-05-29 DIAGNOSIS — I420 Dilated cardiomyopathy: Secondary | ICD-10-CM

## 2023-05-29 DIAGNOSIS — G7000 Myasthenia gravis without (acute) exacerbation: Secondary | ICD-10-CM | POA: Diagnosis not present

## 2023-05-29 NOTE — Progress Notes (Signed)
 Cardiology Office Note:    Date:  05/29/2023   ID:  Tony Combs, Tony Combs [birth year], MRN [id number]  PCP:  Billie Lade, MD   [facility] Providers Cardiologist:  Bryan Lemma, MD     Referring MD: Billie Lade, MD   VF:IEPPIR up CAD and HF  History of Present Illness:    Tony Combs is a 63 y.o. male with a hx of Obstructive CAD non amenable to PCI, Mysasthenia Gravis and stroke seeing neurology, Dilated non obstructive CAD, HTN, and HLD  Tony Combs is a 63 year old male with myasthenia gravis, heart failure, and coronary artery disease who presents for follow-up.  He has a history of coronary artery disease and is currently managing his cholesterol levels with Zetia due to previous issues with statins. His cholesterol levels have been stable, and he is fasting today for lab work to check his current levels. No recent episodes of chest pain or chest pressure.  Regarding his heart failure with reduced ejection fraction, he takes Lasix as needed, averaging about one pill a week, and monitors for swelling by checking his ankles. He reports no significant issues with swelling recently. No significant swelling, syncope, or recent changes in blood pressure.  For his myasthenia gravis, he takes prednisone once in the morning, which helps him, although he sometimes has difficulty speaking if he misses a dose. He feels tired, attributing it to his myasthenia gravis, and notes muscle fatigue. He has seen a specialist in [place] for this condition.  He has a history of iron deficiency anemia, and there is a plan to check his blood counts today. He does not like frequent blood work but understands the necessity.  No recent chest pain, chest pressure, significant swelling, syncope, or recent changes in blood pressure. He feels tired and notes muscle fatigue, which he attributes to myasthenia gravis.  Past Medical History:  Diagnosis Date   CAD in native artery  03/11/2021   Small Non-dominant RCA - prox 90% (CTA FFR 0.66) - Not favorable for PCI - small caliber, non-dominant (<2 mm) vessel.  Mininal diffuse disease in LCx.   Diabetes mellitus without complication (HCC)    Dilated cardiomyopathy (HCC) 01/17/2021   In setting of stroke: TTE 08/31/2020: EF 30 and 35%.  Mildly decreased function.  Moderate LVH.  Unable to assess diastolic function.  Unable assess RV function.  Mild LA dilation.   Hyperlipidemia    Hypertension    Myasthenia gravis (HCC) 03/01/2021   Followed by Dr. 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    Past Surgical History:  Procedure Laterality Date   Coronary CT Angiogram  01/31/2021   Coronary calcium score 40.  No CAD RADS 3.  Moderate stenosis.  RCA has moderate obstructive plaque in the mid and distal vessel.  Otherwise 25-49% OM1. => CT FFR positive, 0.66 in the RCA.   HERNIA REPAIR     LEFT HEART CATH AND CORONARY ANGIOGRAPHY N/A 03/11/2021   Procedure: LEFT HEART CATH AND CORONARY ANGIOGRAPHY;  Surgeon: [name], MD;  Location: [facility] INVASIVE CV LAB;;   Prox RCA lesion is 90% stenosed.-Small to moderate caliber nondominant vessel (<2 mm) => not PCI amenable.  Not the cause of cardiomyopathy.   Otherwise minimal diffuse CAD and a Left Dominant System.   LV EDP is normal with Systemic Hypertension (Central Aop ~20 mmHg > BP Cuff)  TRANSTHORACIC ECHOCARDIOGRAM  08/31/2020   a) EF 30 and 35%.  Mildly decreased function.  Moderate LVH.  Unable to assess diastolic function.  Unable assess RV function.  Mild LA dilation.; b) 12/21/2020: EF remains 30 to 35% with moderately reduced function.  Global HK.  GR 1 DD.  Normal RV size and function.  Normal aortic valves.  Cannot exclude small PFO. => 02/07/21 -> NEGATIVE BUBBLE STUDY.    Current Medications: Current Meds  Medication Sig   aspirin EC 81 MG tablet Take 81 mg by mouth in the morning.  Swallow whole.   B-D UF III MINI PEN NEEDLES 31G X 5 MM MISC SMARTSIG:1 Pre-Filled Pen Syringe SUB-Q 4 Times Daily   carvedilol (COREG) 6.25 MG tablet TAKE 2 TABLETS BY MOUTH 2 TIMES DAILY WITH A MEAL.   Continuous Glucose Sensor (FREESTYLE LIBRE 2 SENSOR) MISC Use to monitor glucose continuously E11.65   ezetimibe (ZETIA) 10 MG tablet Take 1 tablet (10 mg total) by mouth daily.   furosemide (LASIX) 20 MG tablet Take 1 tablet (20 mg total) by mouth daily. (Patient taking differently: Take 20 mg by mouth as needed for fluid or edema.)   insulin glargine, 1 Unit Dial, (TOUJEO) 300 UNIT/ML Solostar Pen Inject 16 Units into the skin at bedtime.   Insulin Pen Needle (PEN NEEDLES) 30G X 5 MM MISC 1 pen by Does not apply route 4 (four) times daily - after meals and at bedtime.   losartan (COZAAR) 100 MG tablet Take 1 tablet (100 mg total) by mouth daily.   measles, mumps & rubella vaccine (MMR) injection Inject 0.5 ml intramuscularly. Please administer second dose after 4 weeks.   NOVOLOG FLEXPEN 100 UNIT/ML FlexPen Inject 4-10 Units into the skin 3 (three) times daily with meals.   predniSONE (DELTASONE) 20 MG tablet Take 1 tablet by mouth daily with breakfast.   spironolactone (ALDACTONE) 25 MG tablet Take 0.5 tablets (12.5 mg total) by mouth 2 (two) times daily. (Patient taking differently: Take 25 mg by mouth daily.)     Allergies:   Patient has no known allergies.   Social History   Socioeconomic History   Marital status: Married    Spouse name: Not on file   Number of children: Not on file   Years of education: Not on file   Highest education level: Not on file  Occupational History   Not on file  Tobacco Use   Smoking status: Never   Smokeless tobacco: Never  Vaping Use   Vaping status: Never Used  Substance and Sexual Activity   Alcohol use: Not Currently   Drug use: Not Currently   Sexual activity: Not Currently  Other Topics Concern   Not on file  Social History Narrative    Not on file   Social Drivers of Health   Financial Resource Strain: Not on file  Food Insecurity: Not on file  Transportation Needs: Not on file  Physical Activity: Not on file  Stress: Not on file  Social Connections: Not on file     Family History: The patient's family history includes Congestive Heart Failure in his father; Coronary artery disease (age of onset: 18) in his father; Diabetes in his brother and mother; Diabetes type II in his father; Heart attack (age of onset: 47) in his father; Heart failure (age of onset: 55) in his father; Hypertension in his father and mother.  EKGs/Labs/Other Studies Reviewed:    The following studies were reviewed today:   Cardiac Studies &  Procedures   ______________________________________________________________________________________________ CARDIAC CATHETERIZATION  CARDIAC CATHETERIZATION 03/11/2021  Narrative   Prox RCA lesion is 90% stenosed.-Small to moderate caliber nondominant vessel (<2 mm)   Otherwise minimal diffuse CAD and a Left Dominant System.   LV end diastolic pressure is normal.   There is no aortic valve stenosis.  SUMMARY Severe Single Vessel CAD with ~90% mid lesion in a small-moderate caliber, non-dominant RCA (lesion is in a ~1.5 mm segment) - NOT PCI AMENABLE; Not the cause of Cardiomyopathy Normal LVEDP; Systemic hypertension with discrepancy between cuff pressure and central aortic pressure of roughly 20 mmHg.   RECOMMENDATIONS Okay to discharge home after bedrest I discussed with inpatient pharmacy to look into med assistance with [facility]. If unable to get refill of Entresto, he does have prescription for valsartan 160 mg once daily. He will continue taking carvedilol at double his original dose (carvedilol 12.5 mg twice daily -> new prescription written.).    Bryan Lemma, M.D., M.S. Interventional Cardiologist  Pager # [phone] Phone # [phone] [address] [place], [place] [address]  Findings Coronary Findings Diagnostic  Dominance: Left  Left Main Vessel was injected. Vessel is large.  Left Anterior Descending The vessel exhibits minimal luminal irregularities. The vessel is tortuous.  First Diagonal Branch The vessel exhibits minimal luminal irregularities.  Second Septal Branch The vessel exhibits minimal luminal irregularities.  Ramus Intermedius Vessel is large. The vessel exhibits minimal luminal irregularities.  Left Circumflex Vessel is large. The vessel exhibits minimal luminal irregularities.  Second Obtuse Marginal Branch Vessel is small in size.  Left Posterior Descending Artery Vessel is moderate in size.  First Left Posterolateral Branch Vessel is small in size.  Left Posterior Atrioventricular Artery Vessel is large in size. The vessel exhibits minimal luminal irregularities.  Right Coronary Artery Vessel was visualized by non-selective angiography. Vessel is small. Small-moderate size vessel Prox RCA lesion is 90% stenosed. The lesion is located proximal to the major branch and focal. At this point, the vessel is &lt;2 mm in diameter-&gt; not PCI target  Acute Marginal Branch Vessel is small in size.  Right Ventricular Branch Vessel is small in size.  Intervention  No interventions have been documented.     ECHOCARDIOGRAM  ECHOCARDIOGRAM COMPLETE 08/20/2022  Narrative ECHOCARDIOGRAM REPORT    Patient Name:   ZAKEE DEERMAN Date of Exam: 08/20/2022 Medical Rec #:  [id number]      Height:       68.0 in Accession #:    [id number]     Weight:       236.2 lb Date of Birth:  10-19-1960       BSA:          2.193 m Patient Age:    62 years       BP:           120/70 mmHg Patient Gender: M              HR:           78 bpm. Exam Location:  [address]  Procedure: 2D Echo, Cardiac Doppler, Color Doppler and Intracardiac Opacification Agent  Indications:    I50.42 CHF  History:        Patient  has prior history of Echocardiogram examinations, most recent 12/21/2020. CHF, Stroke; Risk Factors:Hypertension, Dyslipidemia, Diabetes and Morbid obesity.  Sonographer:    Samule Ohm RDCS Referring Phys: [id number] [facility] Alayla Dethlefs  IMPRESSIONS   1. Left ventricular ejection fraction, by estimation, is  55 to 60%. The left ventricle has normal function. The left ventricle has no regional wall motion abnormalities. There is moderate left ventricular hypertrophy. Left ventricular diastolic parameters are consistent with Grade I diastolic dysfunction (impaired relaxation). 2. Right ventricular systolic function is normal. The right ventricular size is normal. Tricuspid regurgitation signal is inadequate for assessing PA pressure. 3. The mitral valve is normal in structure. No evidence of mitral valve regurgitation. 4. The aortic valve is normal in structure. Aortic valve regurgitation is not visualized.  Conclusion(s)/Recommendation(s): Compared to prior echo, EF has signficantly improved.  FINDINGS Left Ventricle: Left ventricular ejection fraction, by estimation, is 55 to 60%. The left ventricle has normal function. The left ventricle has no regional wall motion abnormalities. Definity contrast agent was given IV to delineate the left ventricular endocardial borders. The left ventricular internal cavity size was normal in size. There is moderate left ventricular hypertrophy. Left ventricular diastolic parameters are consistent with Grade I diastolic dysfunction (impaired relaxation).  Right Ventricle: The right ventricular size is normal. Right ventricular systolic function is normal. Tricuspid regurgitation signal is inadequate for assessing PA pressure.  Left Atrium: Left atrial size was normal in size.  Right Atrium: Right atrial size was normal in size.  Pericardium: There is no evidence of pericardial effusion.  Mitral Valve: The mitral valve is normal in structure. No  evidence of mitral valve regurgitation.  Tricuspid Valve: Tricuspid valve regurgitation is not demonstrated.  Aortic Valve: The aortic valve is normal in structure. Aortic valve regurgitation is not visualized.  Pulmonic Valve: The pulmonic valve was normal in structure. Pulmonic valve regurgitation is not visualized.  Aorta: The aortic root and ascending aorta are structurally normal, with no evidence of dilitation.  IAS/Shunts: No atrial level shunt detected by color flow Doppler.   LEFT VENTRICLE PLAX 2D LVIDd:         4.70 cm   Diastology LVIDs:         3.50 cm   LV e' medial:    8.27 cm/s LV PW:         1.10 cm   LV E/e' medial:  9.3 LV IVS:        1.50 cm   LV e' lateral:   8.81 cm/s LVOT diam:     2.30 cm   LV E/e' lateral: 8.7 LV SV:         66 LV SV Index:   30 LVOT Area:     4.15 cm   RIGHT VENTRICLE             IVC RV S prime:     12.80 cm/s  IVC diam: 0.90 cm TAPSE (M-mode): 1.8 cm  LEFT ATRIUM             Index        RIGHT ATRIUM           Index LA diam:        3.40 cm 1.55 cm/m   RA Pressure: 3.00 mmHg LA Vol (A2C):   45.5 ml 20.74 ml/m  RA Area:     12.30 cm LA Vol (A4C):   49.9 ml 22.75 ml/m  RA Volume:   30.60 ml  13.95 ml/m LA Biplane Vol: 47.8 ml 21.79 ml/m AORTIC VALVE LVOT Vmax:   75.00 cm/s LVOT Vmean:  55.100 cm/s LVOT VTI:    0.158 m  AORTA Ao Root diam: 3.80 cm Ao Asc diam:  3.50 cm  MITRAL VALVE  TRICUSPID VALVE MV Area (PHT): 2.85 cm    Estimated RAP:  3.00 mmHg MV Decel Time: 266 msec MV E velocity: 76.50 cm/s  SHUNTS MV A velocity: 96.40 cm/s  Systemic VTI:  0.16 m MV E/A ratio:  0.79        Systemic Diam: 2.30 cm  Carolan Clines Electronically signed by Carolan Clines Signature Date/Time: 08/20/2022/9:15:12 AM    Final      CT SCANS  CT CORONARY FRACTIONAL FLOW RESERVE DATA PREP 01/31/2021  Narrative EXAM: CT-FFR ANALYSIS  CLINICAL DATA:  Possibly obstructive coronary lesion (Soft  plaque RCA)  FINDINGS: CT-FFR analysis was performed on the original cardiac CT angiogram dataset. Diagrammatic representation of the CT-FFR analysis is provided in a separate PDF document in PACS. This dictation was created using the PDF document and an interactive 3D model of the results. 3D model is not available in the EMR/PACS. Normal FFR range is >0.80.  1. Left Main: No significant functional stenosis, CT-FFR 1.00.  2. LAD: No significant functional stenosis, CT-FFR 0.98. 3. LCX: No significant functional stenosis, CT-FFR 0.99 at level of mild lesion. 4. RCA: significant functional stenosis, CT-FFR 0.66 at the level of the RCA lesion.  IMPRESSION: 1. CT FFR analysis shows evidence of significant functional stenosis.  Riley Lam MD   Electronically Signed By: Riley Lam M.D. On: 01/31/2021 10:51   CT CORONARY MORPH W/CTA COR W/SCORE 01/31/2021  Addendum 01/31/2021 10:48 AM ADDENDUM REPORT: 01/31/2021 10:46  CLINICAL DATA:  63 Year old White Male  EXAM: Cardiac/Coronary  CTA  TECHNIQUE: The patient was scanned on a [employer].  FINDINGS: Scan was triggered in the descending thoracic aorta. Axial non-contrast 3 mm slices were carried out through the heart. The data set was analyzed on a dedicated work station and scored using the Agatson method. Gantry rotation speed was 250 msecs and collimation was .6 mm. 0.8 mg of sl NTG was given. The 3D data set was reconstructed in 5% intervals of the 67-82 % of the R-R cycle. Diastolic phases were analyzed on a dedicated work station using MPR, MIP and VRT modes. The patient received 95 cc of contrast.  Aorta:  Normal size.  Aortic atherosclerosis.  No dissection.  Main Pulmonary Artery: Normal size of the pulmonary artery.  Aortic Valve:  Tri-leaflet.  No calcifications.  Coronary Arteries:  Normal coronary origin.  Co-dominance.  Coronary Calcium Score:  Left main:  0  Left anterior descending artery: 0  Left circumflex artery: 40  Right coronary artery: 0  Total: 40  Percentile: 49th for age, sex, and race matched control.  RCA is a large co-dominant artery that gives rise to PDA and PLA. There is a moderate obstructive soft plaque in the mid and distal vessel.  Left main is a large artery that gives rise to LAD and LCX arteries. There is no significant plaque.  LAD is a large vessel that gives rise to one small D1 vessel and one large D2 Branch. Minimal soft plaque (1-24%) in the proximal vessel.  LCX is a co-dominant artery that gives rise to one large OM1 branch that bifurcates and supplies a ramus distribution and multiple small OM vessels. Mild mixed plaque (25-49%) in the mid vessel.  Other findings:  Normal pulmonary vein drainage into the left atrium.  Normal left atrial appendage without a thrombus.  Extra-cardiac findings: See attached radiology report for non-cardiac structures.  IMPRESSION: 1. Coronary calcium score of 40. This was 49th percentile for age,  sex, and race matched control.  2. Normal coronary origin with co-dominance.  3. CAD-RADS 3. Moderate stenosis. Consider symptom-guided anti-ischemic pharmacotherapy as well as risk factor modification per guideline directed care. Additional analysis with CT FFR will be submitted  4.  Aortic atherosclerosis.  RECOMMENDATIONS:  Coronary artery calcium (CAC) score is a strong predictor of incident coronary heart disease (CHD) and provides predictive information beyond traditional risk factors. CAC scoring is reasonable to use in the decision to withhold, postpone, or initiate statin therapy in intermediate-risk or selected borderline-risk asymptomatic adults (age 72-75 years and LDL-C >=70 to <190 mg/dL) who do not have diabetes or established atherosclerotic cardiovascular disease (ASCVD).* In intermediate-risk (10-year ASCVD risk >=7.5% to <20%) adults or  selected borderline-risk (10-year ASCVD risk >=5% to <7.5%) adults in whom a CAC score is measured for the purpose of making a treatment decision the following recommendations have been made:  If CAC = 0, it is reasonable to withhold statin therapy and reassess in 5 to 10 years, as long as higher risk conditions are absent (diabetes mellitus, family history of premature CHD in first degree relatives (males <55 years; females <65 years), cigarette smoking, LDL >=190 mg/dL or other independent risk factors).  If CAC is 1 to 99, it is reasonable to initiate statin therapy for patients >=61 years of age.  If CAC is >=100 or >=75th percentile, it is reasonable to initiate statin therapy at any age.  Cardiology referral should be considered for patients with CAC scores =400 or >=75th percentile.  *2018 AHA/ACC/AACVPR/AAPA/ABC/ACPM/ADA/AGS/APhA/ASPC/NLA/PCNA Guideline on the Management of Blood Cholesterol: A Report of the American College of Cardiology/American Heart Association Task Force on Clinical Practice Guidelines. J Am Coll Cardiol. 2019;73(24):3168-3209.  Riley Lam, MD   Electronically Signed By: Riley Lam M.D. On: 01/31/2021 10:46  Narrative EXAM: OVER-READ INTERPRETATION  CT CHEST  The following report is an over-read performed by radiologist Dr. Trudie Reed of [facility] on 01/31/2021. This over-read does not include interpretation of cardiac or coronary anatomy or pathology. The coronary calcium score/coronary CTA interpretation by the cardiologist is attached.  COMPARISON:  None.  FINDINGS: Atherosclerotic calcifications in the thoracic aorta. Within the visualized portions of the thorax there are no suspicious appearing pulmonary nodules or masses, there is no acute consolidative airspace disease, no pleural effusions, no pneumothorax and no lymphadenopathy. Visualized portions of the upper abdomen are unremarkable.  There are no aggressive appearing lytic or blastic lesions noted in the visualized portions of the skeleton.  IMPRESSION: 1.  Aortic Atherosclerosis (ICD10-I70.0).  Electronically Signed: By: Trudie Reed M.D. On: 01/31/2021 08:57     ______________________________________________________________________________________________       Recent Labs: 08/08/2022: ALT 19; Hemoglobin 12.1; Platelets 179; TSH 1.620 03/13/2023: BUN 10; Creatinine, Ser 0.87; Potassium 4.6; Sodium 138  Recent Lipid Panel    Component Value Date/Time   CHOL 118 08/08/2022 0816   TRIG 257 (H) 08/08/2022 0816   HDL 29 (L) 08/08/2022 0816   CHOLHDL 4.1 08/08/2022 0816   CHOLHDL 3.7 03/11/2021 0756   VLDL 21 03/11/2021 0756   LDLCALC 49 08/08/2022 0816     Physical Exam:    VS:  BP (!) 140/78 (BP Location: Right Arm)   Pulse 68   Ht 5\' 8"  (1.727 m)   Wt 107 kg   SpO2 97%   BMI 35.88 kg/m     Wt Readings from Last 3 Encounters:  05/29/23 107 kg  04/14/23 106.9 kg  03/16/23 105.4 kg  GEN: Morbid obesity HEENT: Normal NECK: No JVD CARDIAC: RRR, no murmurs, rubs, gallops RESPIRATORY:  Clear to auscultation without rales, wheezing or rhonchi  ABDOMEN: Soft, non-tender, non-distended MUSCULOSKELETAL:  Trace left leg edema; No deformity  SKIN: Warm and dry NEUROLOGIC:  Alert and oriented x 3 walks with cane PSYCHIATRIC:  Normal affect   ASSESSMENT:    1. Chronic combined systolic and diastolic heart failure (HCC)   2. Hyperlipidemia associated with type 2 diabetes mellitus (HCC)   3. Myasthenia gravis (HCC)   4. Thoracic aortic atherosclerosis (HCC)   5. Cardiomyopathy, dilated, nonischemic (HCC)   6. Iron deficiency anemia, unspecified iron deficiency anemia type   7. Coronary artery disease of native artery of native heart with stable angina pectoris (HCC)     PLAN:    Heart failure with reduced ejection fraction Chronic heart failure with reduced ejection fraction, likely  secondary to coronary artery disease. Asymptomatic with no recent episodes of angina or significant edema. Reports fatigue, possibly related to myasthenia gravis or anemia. Blood pressure stable. - Continue current heart failure management regimen  Coronary artery disease Well-managed coronary artery disease with no recent angina. Cholesterol levels controlled, but previous statin intolerance noted. Considering alternative lipid-lowering therapy. - Order fasting lipid panel - Consider alternative lipid-lowering therapy if necessary  Myasthenia gravis Chronic myasthenia gravis managed with prednisone. Reports muscle fatigue, a common symptom. Medication prescriptions require careful consideration due to potential symptom exacerbation. - Continue prednisone - Monitor for symptom exacerbation  Iron deficiency anemia Iron deficiency anemia with previous low blood counts. Asymptomatic but reports fatigue, possibly related to anemia. Evaluating current status with blood tests. - Order CBC and iron studies - Consider iron supplementation if anemia confirmed  Diabetes mellitus Diabetes mellitus with generally well-controlled blood glucose. Elevated A1c noted. Discussed potential use of GLP-1 receptor agonists, considering risk of muscle wasting in context of myasthenia gravis. - Monitor blood glucose and A1c - Discuss GLP-1 receptor agonists if interested, considering muscle wasting risk  General Health Maintenance Routine screenings and immunizations are current.  Follow-up Routine follow-up to monitor chronic conditions and adjust treatment as necessary. - Schedule follow-up in one year unless issues arise        Medication Adjustments/Labs and Tests Ordered: Current medicines are reviewed at length with the patient today.  Concerns regarding medicines are outlined above.  Orders Placed This Encounter  Procedures   Lipid panel   CBC   Iron, TIBC and Ferritin Panel   EKG 12-Lead    No orders of the defined types were placed in this encounter.   Patient Instructions  Medication Instructions:  Your physician recommends that you continue on your current medications as directed. Please refer to the Current Medication list given to you today.  *If you need a refill on your cardiac medications before your next appointment, please call your pharmacy*  Lab Work: CBC, FLP, iron studies  If you have labs (blood work) drawn today and your tests are completely normal, you will receive your results only by: MyChart Message (if you have MyChart) OR A paper copy in the mail If you have any lab test that is abnormal or we need to change your treatment, we will call you to review the results.  Testing/Procedures: NONE  Follow-Up: At [facility], you and your health needs are our priority.  As part of our continuing mission to provide you with exceptional heart care, our providers are all part of one team.  This team includes  your primary Cardiologist (physician) and Advanced Practice Providers or APPs (Physician Assistants and Nurse Practitioners) who all work together to provide you with the care you need, when you need it.  Your next appointment:   12 month(s)  Provider:   Riley Lam, MD    We recommend signing up for the patient portal called "MyChart".  Sign up information is provided on this After Visit Summary.  MyChart is used to connect with patients for Virtual Visits (Telemedicine).  Patients are able to view lab/test results, encounter notes, upcoming appointments, etc.  Non-urgent messages can be sent to your provider as well.   To learn more about what you can do with MyChart, go to [email or web address].   Other Instructions       1st Floor: - Lobby - Registration  - Pharmacy  - Lab - Cafe  2nd Floor: - PV Lab - Diagnostic Testing (echo, CT, nuclear med)  3rd Floor: - Vacant  4th Floor: - TCTS (cardiothoracic  surgery) - AFib Clinic - Structural Heart Clinic - Vascular Surgery  - Vascular Ultrasound  5th Floor: - HeartCare Cardiology (general and EP) - Clinical Pharmacy for coumadin, hypertension, lipid, weight-loss medications, and med management appointments    Valet parking services will be available as well.      Signed, Christell Constant, MD  05/29/2023 10:15 AM    [facility]

## 2023-05-29 NOTE — Patient Instructions (Signed)
 Medication Instructions:  Your physician recommends that you continue on your current medications as directed. Please refer to the Current Medication list given to you today.  *If you need a refill on your cardiac medications before your next appointment, please call your pharmacy*  Lab Work: CBC, FLP, iron studies  If you have labs (blood work) drawn today and your tests are completely normal, you will receive your results only by: MyChart Message (if you have MyChart) OR A paper copy in the mail If you have any lab test that is abnormal or we need to change your treatment, we will call you to review the results.  Testing/Procedures: NONE  Follow-Up: At [facility], you and your health needs are our priority.  As part of our continuing mission to provide you with exceptional heart care, our providers are all part of one team.  This team includes your primary Cardiologist (physician) and Advanced Practice Providers or APPs (Physician Assistants and Nurse Practitioners) who all work together to provide you with the care you need, when you need it.  Your next appointment:   12 month(s)  Provider:   Riley Lam, MD    We recommend signing up for the patient portal called "MyChart".  Sign up information is provided on this After Visit Summary.  MyChart is used to connect with patients for Virtual Visits (Telemedicine).  Patients are able to view lab/test results, encounter notes, upcoming appointments, etc.  Non-urgent messages can be sent to your provider as well.   To learn more about what you can do with MyChart, go to [email or web address].   Other Instructions       1st Floor: - Lobby - Registration  - Pharmacy  - Lab - Cafe  2nd Floor: - PV Lab - Diagnostic Testing (echo, CT, nuclear med)  3rd Floor: - Vacant  4th Floor: - TCTS (cardiothoracic surgery) - AFib Clinic - Structural Heart Clinic - Vascular Surgery  - Vascular Ultrasound  5th  Floor: - HeartCare Cardiology (general and EP) - Clinical Pharmacy for coumadin, hypertension, lipid, weight-loss medications, and med management appointments    Valet parking services will be available as well.

## 2023-05-30 LAB — CBC
Hematocrit: 40.1 % (ref 37.5–51.0)
Hemoglobin: 13.4 g/dL (ref 13.0–17.7)
MCH: 29.8 pg (ref 26.6–33.0)
MCHC: 33.4 g/dL (ref 31.5–35.7)
MCV: 89 fL (ref 79–97)
Platelets: 221 10*3/uL (ref 150–450)
RBC: 4.5 x10E6/uL (ref 4.14–5.80)
RDW: 13.1 % (ref 11.6–15.4)
WBC: 9.1 10*3/uL (ref 3.4–10.8)

## 2023-05-30 LAB — LIPID PANEL
Chol/HDL Ratio: 4.9 ratio (ref 0.0–5.0)
Cholesterol, Total: 180 mg/dL (ref 100–199)
HDL: 37 mg/dL — ABNORMAL LOW (ref >39)
LDL Chol Calc (NIH): 100 mg/dL — ABNORMAL HIGH (ref 0–99)
Triglycerides: 253 mg/dL — ABNORMAL HIGH (ref 0–149)
VLDL Cholesterol Cal: 43 mg/dL — ABNORMAL HIGH (ref 5–40)

## 2023-05-30 LAB — IRON,TIBC AND FERRITIN PANEL
Ferritin: 286 ng/mL (ref 30–400)
Iron Saturation: 27 % (ref 15–55)
Iron: 63 ug/dL (ref 38–169)
Total Iron Binding Capacity: 231 ug/dL — ABNORMAL LOW (ref 250–450)
UIBC: 168 ug/dL (ref 111–343)

## 2023-05-31 ENCOUNTER — Other Ambulatory Visit: Payer: Self-pay | Admitting: Internal Medicine

## 2023-05-31 DIAGNOSIS — I10 Essential (primary) hypertension: Secondary | ICD-10-CM

## 2023-06-04 ENCOUNTER — Telehealth: Payer: Self-pay | Admitting: *Deleted

## 2023-06-04 ENCOUNTER — Other Ambulatory Visit: Payer: Self-pay | Admitting: *Deleted

## 2023-06-04 DIAGNOSIS — Z794 Long term (current) use of insulin: Secondary | ICD-10-CM

## 2023-06-04 MED ORDER — INSULIN LISPRO (1 UNIT DIAL) 100 UNIT/ML (KWIKPEN): 4.0000 [IU] | PEN_INJECTOR | Freq: Three times a day (TID) | SUBCUTANEOUS | 0 refills | Status: DC

## 2023-06-04 NOTE — Telephone Encounter (Signed)
 Patient left a voicemail that his insurance will not cover the Novolog, that it now has to be the Humalog. We will send in a prescription for it.

## 2023-06-10 DIAGNOSIS — E119 Type 2 diabetes mellitus without complications: Secondary | ICD-10-CM | POA: Diagnosis not present

## 2023-06-15 ENCOUNTER — Encounter: Payer: Self-pay | Admitting: Internal Medicine

## 2023-06-15 ENCOUNTER — Ambulatory Visit (INDEPENDENT_AMBULATORY_CARE_PROVIDER_SITE_OTHER): Payer: Medicare Other | Admitting: Internal Medicine

## 2023-06-15 ENCOUNTER — Telehealth: Payer: Self-pay | Admitting: Nurse Practitioner

## 2023-06-15 VITALS — BP 124/68 | HR 78 | Ht 68.0 in | Wt 237.8 lb

## 2023-06-15 DIAGNOSIS — E785 Hyperlipidemia, unspecified: Secondary | ICD-10-CM

## 2023-06-15 DIAGNOSIS — I10 Essential (primary) hypertension: Secondary | ICD-10-CM

## 2023-06-15 DIAGNOSIS — E1165 Type 2 diabetes mellitus with hyperglycemia: Secondary | ICD-10-CM

## 2023-06-15 DIAGNOSIS — Z794 Long term (current) use of insulin: Secondary | ICD-10-CM | POA: Diagnosis not present

## 2023-06-15 DIAGNOSIS — E1169 Type 2 diabetes mellitus with other specified complication: Secondary | ICD-10-CM

## 2023-06-15 DIAGNOSIS — I5042 Chronic combined systolic (congestive) and diastolic (congestive) heart failure: Secondary | ICD-10-CM

## 2023-06-15 DIAGNOSIS — G7000 Myasthenia gravis without (acute) exacerbation: Secondary | ICD-10-CM | POA: Diagnosis not present

## 2023-06-15 MED ORDER — INSULIN LISPRO (1 UNIT DIAL) 100 UNIT/ML (KWIKPEN): 4.0000 [IU] | PEN_INJECTOR | Freq: Three times a day (TID) | SUBCUTANEOUS | 0 refills | Status: DC

## 2023-06-15 NOTE — Telephone Encounter (Signed)
 Rx for Humalog  Kwikpen inject 4-10 units tid with meals 90 day supply sent to [facility] to replace Novolog .

## 2023-06-15 NOTE — Assessment & Plan Note (Signed)
 Remains adequately controlled on current antihypertensive regimen.  No medication changes are indicated today.

## 2023-06-15 NOTE — Assessment & Plan Note (Signed)
 Lipid panel updated earlier this month.  Total cholesterol 180 and LDL 100.  He is currently prescribed Zetia  10 mg daily and is discussing Repatha with his cardiologist.

## 2023-06-15 NOTE — Telephone Encounter (Signed)
 Pt states ins will not pay for Novolog .  He thinks they will pay for humalog  but not sure.  Pt needs insulin  called in [facility].

## 2023-06-15 NOTE — Assessment & Plan Note (Signed)
 Most recent A1c 8.5.  Seen by endocrinology for follow-up in February.  Toujeo /NovoLog  adjusted.  GLP-1 therapy discussed but ultimately not started due to concern for GI side effects.  Endocrinology follow-up scheduled for 5/28.

## 2023-06-15 NOTE — Progress Notes (Signed)
 Established Patient Office Visit  Subjective   Patient ID: Tony Combs, male    DOB: July 28, 1960  Age: 63 y.o. MRN: [id number]  Chief Complaint  Patient presents with   Care Management    Three month follow up   Tony Combs returns to care today for routine follow-up.  Tony Combs was last evaluated by me on 1/27.  At that time Tony Combs endorsed continued weakness in the setting of myasthenia gravis.  Tony Combs had last been seen by neurology in October and had a documented history of failing multiple medications previously.  Our office contacted his neurologist, who recommended a referral to a tertiary care center to discuss further treatment for MG given failure of previous treatments.  A referral was placed to [facility].  No medication changes were made and 65-month follow-up was arranged.  In the interim, Tony Combs was evaluated by neurology at [facility] on 4/2 and has started CellCept.  Tony Combs has also been seen by endocrinology and cardiology for follow-up.  Tony Combs reports feeling well today and has no acute concerns to discuss.  Past Medical History:  Diagnosis Date   CAD in native artery 03/11/2021   Small Non-dominant RCA - prox 90% (CTA FFR 0.66) - Not favorable for PCI - small caliber, non-dominant (<2 mm) vessel.  Mininal diffuse disease in LCx.   Diabetes mellitus without complication (HCC)    Dilated cardiomyopathy (HCC) 01/17/2021   In setting of stroke: TTE 08/31/2020: EF 30 and 35%.  Mildly decreased function.  Moderate LVH.  Unable to assess diastolic function.  Unable assess RV function.  Mild LA dilation.   Hyperlipidemia    Hypertension    Myasthenia gravis (HCC) 03/01/2021   Followed by Dr. 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   Past Surgical History:  Procedure Laterality Date   Coronary CT Angiogram  01/31/2021   Coronary calcium  score 40.  No CAD RADS 3.  Moderate stenosis.  RCA has moderate  obstructive plaque in the mid and distal vessel.  Otherwise 25-49% OM1. => CT FFR positive, 0.66 in the RCA.   HERNIA REPAIR     LEFT HEART CATH AND CORONARY ANGIOGRAPHY N/A 03/11/2021   Procedure: LEFT HEART CATH AND CORONARY ANGIOGRAPHY;  Surgeon: Arleen Lacer, MD;  Location: [facility] INVASIVE CV LAB;;   Prox RCA lesion is 90% stenosed.-Small to moderate caliber nondominant vessel (<2 mm) => not PCI amenable.  Not the cause of cardiomyopathy.   Otherwise minimal diffuse CAD and a Left Dominant System.   LV EDP is normal with Systemic Hypertension (Central Aop ~20 mmHg > BP Cuff)   TRANSTHORACIC ECHOCARDIOGRAM  08/31/2020   a) EF 30 and 35%.  Mildly decreased function.  Moderate LVH.  Unable to assess diastolic function.  Unable assess RV function.  Mild LA dilation.; b) 12/21/2020: EF remains 30 to 35% with moderately reduced function.  Global HK.  GR 1 DD.  Normal RV size and function.  Normal aortic valves.  Cannot exclude small PFO. => 02/07/21 -> NEGATIVE BUBBLE STUDY.   Social History   Tobacco Use   Smoking status: Never   Smokeless tobacco: Never  Vaping Use   Vaping status: Never Used  Substance Use Topics   Alcohol use: Not Currently   Drug use: Not Currently   Family History  Problem Relation Age of Onset   Hypertension Mother    Diabetes Mother  Heart failure Father 13   Heart attack Father 13   Hypertension Father    Diabetes type II Father    Coronary artery disease Father 72   Congestive Heart Failure Father    Diabetes Brother    No Known Allergies  Review of Systems  Constitutional:  Negative for chills and fever.  HENT:  Negative for sore throat.   Respiratory:  Negative for cough and shortness of breath.   Cardiovascular:  Negative for chest pain, palpitations and leg swelling.  Gastrointestinal:  Negative for abdominal pain, blood in stool, constipation, diarrhea, nausea and vomiting.  Genitourinary:  Negative for dysuria and hematuria.  Musculoskeletal:   Negative for myalgias.  Skin:  Negative for itching and rash.  Neurological:  Negative for dizziness and headaches.  Psychiatric/Behavioral:  Negative for depression and suicidal ideas.      Objective:     BP 124/68   Pulse 78   Ht 5\' 8"  (1.727 m)   Wt 237 lb 12.8 oz (107.9 kg)   SpO2 98%   BMI 36.16 kg/m  BP Readings from Last 3 Encounters:  06/15/23 124/68  05/29/23 (!) 140/78  04/14/23 128/80   Physical Exam Vitals reviewed.  Constitutional:      General: Tony Combs is not in acute distress.    Appearance: Normal appearance. Tony Combs is obese. Tony Combs is not ill-appearing.  HENT:     Head: Normocephalic and atraumatic.     Right Ear: External ear normal.     Left Ear: External ear normal.     Nose: Nose normal. No congestion or rhinorrhea.     Mouth/Throat:     Mouth: Mucous membranes are moist.     Pharynx: Oropharynx is clear.  Eyes:     General: No scleral icterus.    Extraocular Movements: Extraocular movements intact.     Conjunctiva/sclera: Conjunctivae normal.     Pupils: Pupils are equal, round, and reactive to light.  Cardiovascular:     Rate and Rhythm: Normal rate and regular rhythm.     Pulses: Normal pulses.     Heart sounds: Normal heart sounds. No murmur heard. Pulmonary:     Effort: Pulmonary effort is normal.     Breath sounds: Normal breath sounds. No wheezing, rhonchi or rales.  Abdominal:     General: Abdomen is flat. Bowel sounds are normal. There is no distension.     Palpations: Abdomen is soft.     Tenderness: There is no abdominal tenderness.  Musculoskeletal:        General: No swelling or deformity. Normal range of motion.     Cervical back: Normal range of motion.  Skin:    General: Skin is warm and dry.     Capillary Refill: Capillary refill takes less than 2 seconds.  Neurological:     General: No focal deficit present.     Mental Status: Tony Combs is alert and oriented to person, place, and time.     Motor: No weakness.     Gait: Gait abnormal  (ambulates with a cane).  Psychiatric:        Mood and Affect: Mood normal.        Behavior: Behavior normal.        Thought Content: Thought content normal.   Last CBC Lab Results  Component Value Date   WBC 9.1 05/29/2023   HGB 13.4 05/29/2023   HCT 40.1 05/29/2023   MCV 89 05/29/2023   MCH 29.8 05/29/2023   RDW 13.1 05/29/2023  PLT 221 05/29/2023   Last metabolic panel Lab Results  Component Value Date   GLUCOSE 173 (H) 03/13/2023   NA 138 03/13/2023   K 4.6 03/13/2023   CL 99 03/13/2023   CO2 20 03/13/2023   BUN 10 03/13/2023   CREATININE 0.87 03/13/2023   EGFR 96 03/13/2023   CALCIUM  9.0 03/13/2023   PHOS 4.0 09/01/2020   PROT 6.2 08/08/2022   ALBUMIN 4.4 08/08/2022   LABGLOB 1.8 08/08/2022   AGRATIO 1.6 02/03/2022   BILITOT 0.3 08/08/2022   ALKPHOS 94 08/08/2022   AST 16 08/08/2022   ALT 19 08/08/2022   ANIONGAP 5 09/02/2020   Last lipids Lab Results  Component Value Date   CHOL 180 05/29/2023   HDL 37 (L) 05/29/2023   LDLCALC 100 (H) 05/29/2023   TRIG 253 (H) 05/29/2023   CHOLHDL 4.9 05/29/2023   Last hemoglobin A1c Lab Results  Component Value Date   HGBA1C 8.5 (A) 04/14/2023   Last thyroid functions Lab Results  Component Value Date   TSH 1.620 08/08/2022   Last vitamin D  Lab Results  Component Value Date   VD25OH 26.5 (L) 08/08/2022     Assessment & Plan:   Problem List Items Addressed This Visit       Essential hypertension - Primary (Chronic)   Remains adequately controlled on current antihypertensive regimen.  No medication changes are indicated today.      Chronic combined systolic and diastolic heart failure (HCC) (Chronic)   Appears euvolemic on exam.  Recently seen by cardiology for follow-up.  On appropriate GDMT.      Hyperlipidemia associated with type 2 diabetes mellitus (HCC) (Chronic)   Lipid panel updated earlier this month.  Total cholesterol 180 and LDL 100.  Tony Combs is currently prescribed Zetia  10 mg daily and is  discussing Repatha with his cardiologist.      Type 2 diabetes mellitus with hyperglycemia (HCC)   Most recent A1c 8.5.  Seen by endocrinology for follow-up in February.  Toujeo /NovoLog  adjusted.  GLP-1 therapy discussed but ultimately not started due to concern for GI side effects.  Endocrinology follow-up scheduled for 5/28.      Myasthenia gravis [facility])   Tony Combs established care with [facility] earlier this month (4/2).  Per review of documentation from that encounter, the plan was to start CellCept and add prednisone if needed.  Mr. Bernasconi contacted neurology 4/17 endorsing significant symptomatic improvement.  Tony Combs again reports feeling well today.  On further discussion, it was revealed that Tony Combs has been taking prednisone 20 mg daily and never started CellCept as Tony Combs believed CellCept was the medication to add if symptoms did not improve with prednisone.  We reviewed the intended plan from neurology.  Will have our office staff contact neurology to clarify that Mr. Prout has been taking prednisone and never started CellCept.  I advised that Tony Combs contact neurology as well to follow-up on this issue.       Return in about 3 months (around 09/14/2023).    Tobi Fortes, MD

## 2023-06-15 NOTE — Assessment & Plan Note (Signed)
 Appears euvolemic on exam.  Recently seen by cardiology for follow-up.  On appropriate GDMT.

## 2023-06-15 NOTE — Patient Instructions (Signed)
 It was a pleasure to see you today.  Thank you for giving us  the opportunity to be involved in your care.  Below is a brief recap of your visit and next steps.  We will plan to see you again in 3 months.  Summary No medication changes today Will clarify CellCept vs prednisone with neurology Follow up in 3 months

## 2023-06-15 NOTE — Assessment & Plan Note (Signed)
 He established care with [facility] earlier this month (4/2).  Per review of documentation from that encounter, the plan was to start CellCept and add prednisone if needed.  Tony Combs contacted neurology 4/17 endorsing significant symptomatic improvement.  He again reports feeling well today.  On further discussion, it was revealed that he has been taking prednisone 20 mg daily and never started CellCept as he believed CellCept was the medication to add if symptoms did not improve with prednisone.  We reviewed the intended plan from neurology.  Will have our office staff contact neurology to clarify that Tony Combs has been taking prednisone and never started CellCept.  I advised that he contact neurology as well to follow-up on this issue.

## 2023-07-15 ENCOUNTER — Encounter: Payer: Self-pay | Admitting: Nurse Practitioner

## 2023-07-15 ENCOUNTER — Ambulatory Visit: Payer: Medicare Other | Admitting: Nurse Practitioner

## 2023-07-15 VITALS — BP 128/78 | HR 85 | Ht 68.0 in | Wt 240.4 lb

## 2023-07-15 DIAGNOSIS — E1165 Type 2 diabetes mellitus with hyperglycemia: Secondary | ICD-10-CM | POA: Diagnosis not present

## 2023-07-15 DIAGNOSIS — I10 Essential (primary) hypertension: Secondary | ICD-10-CM | POA: Diagnosis not present

## 2023-07-15 DIAGNOSIS — E782 Mixed hyperlipidemia: Secondary | ICD-10-CM

## 2023-07-15 DIAGNOSIS — Z794 Long term (current) use of insulin: Secondary | ICD-10-CM | POA: Diagnosis not present

## 2023-07-15 LAB — POCT GLYCOSYLATED HEMOGLOBIN (HGB A1C): Hemoglobin A1C: 7.7 % — AB (ref 4.0–5.6)

## 2023-07-15 MED ORDER — INSULIN LISPRO (1 UNIT DIAL) 100 UNIT/ML (KWIKPEN): 4.0000 [IU] | PEN_INJECTOR | Freq: Three times a day (TID) | SUBCUTANEOUS | 3 refills | Status: DC

## 2023-07-15 NOTE — Progress Notes (Signed)
 Endocrinology Follow Up Note       07/15/2023, 8:25 AM   Subjective:    Patient ID: Tony Combs, male    DOB: 12/03/1960.  Tony Combs is being seen in follow up after being seen in consultation for management of currently uncontrolled symptomatic diabetes requested by  Tobi Fortes, MD.   Past Medical History:  Diagnosis Date   CAD in native artery 03/11/2021   Small Non-dominant RCA - prox 90% (CTA FFR 0.66) - Not favorable for PCI - small caliber, non-dominant (<2 mm) vessel.  Mininal diffuse disease in LCx.   Diabetes mellitus without complication (HCC)    Dilated cardiomyopathy (HCC) 01/17/2021   In setting of stroke: TTE 08/31/2020: EF 30 and 35%.  Mildly decreased function.  Moderate LVH.  Unable to assess diastolic function.  Unable assess RV function.  Mild LA dilation.   Hyperlipidemia    Hypertension    Myasthenia gravis (HCC) 03/01/2021   Followed by Dr. 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    Past Surgical History:  Procedure Laterality Date   Coronary CT Angiogram  01/31/2021   Coronary calcium  score 40.  No CAD RADS 3.  Moderate stenosis.  RCA has moderate obstructive plaque in the mid and distal vessel.  Otherwise 25-49% OM1. => CT FFR positive, 0.66 in the RCA.   HERNIA REPAIR     LEFT HEART CATH AND CORONARY ANGIOGRAPHY N/A 03/11/2021   Procedure: LEFT HEART CATH AND CORONARY ANGIOGRAPHY;  Surgeon: Arleen Lacer, MD;  Location: [facility] INVASIVE CV LAB;;   Prox RCA lesion is 90% stenosed.-Small to moderate caliber nondominant vessel (<2 mm) => not PCI amenable.  Not the cause of cardiomyopathy.   Otherwise minimal diffuse CAD and a Left Dominant System.   LV EDP is normal with Systemic Hypertension (Central Aop ~20 mmHg > BP Cuff)   TRANSTHORACIC ECHOCARDIOGRAM  08/31/2020   a) EF 30 and 35%.  Mildly decreased  function.  Moderate LVH.  Unable to assess diastolic function.  Unable assess RV function.  Mild LA dilation.; b) 12/21/2020: EF remains 30 to 35% with moderately reduced function.  Global HK.  GR 1 DD.  Normal RV size and function.  Normal aortic valves.  Cannot exclude small PFO. => 02/07/21 -> NEGATIVE BUBBLE STUDY.    Social History   Socioeconomic History   Marital status: Married    Spouse name: Not on file   Number of children: Not on file   Years of education: Not on file   Highest education level: Not on file  Occupational History   Not on file  Tobacco Use   Smoking status: Never   Smokeless tobacco: Never  Vaping Use   Vaping status: Never Used  Substance and Sexual Activity   Alcohol use: Not Currently   Drug use: Not Currently   Sexual activity: Not Currently  Other Topics Concern   Not on file  Social History Narrative   Not on file   Social Drivers of Health   Financial Resource Strain: Not on file  Food Insecurity: Not on file  Transportation Needs: Not on file  Physical Activity: Not on file  Stress: Not on file  Social Connections: Not on file    Family History  Problem Relation Age of Onset   Hypertension Mother    Diabetes Mother    Heart failure Father 53   Heart attack Father 66   Hypertension Father    Diabetes type II Father    Coronary artery disease Father 72   Congestive Heart Failure Father    Diabetes Brother     Outpatient Encounter Medications as of 07/15/2023  Medication Sig   aspirin  EC 81 MG tablet Take 81 mg by mouth in the morning. Swallow whole.   B-D UF III MINI PEN NEEDLES 31G X 5 MM MISC SMARTSIG:1 Pre-Filled Pen Syringe SUB-Q 4 Times Daily   carvedilol  (COREG ) 6.25 MG tablet TAKE 2 TABLETS BY MOUTH TWICE  DAILY WITH MEALS   Continuous Glucose Sensor (FREESTYLE LIBRE 2 SENSOR) MISC Use to monitor glucose continuously E11.65   ezetimibe  (ZETIA ) 10 MG tablet Take 1 tablet (10 mg total) by mouth daily.   furosemide  (LASIX ) 20  MG tablet TAKE 1 TABLET BY MOUTH DAILY   insulin  glargine, 1 Unit Dial , (TOUJEO ) 300 UNIT/ML Solostar Pen Inject 16 Units into the skin at bedtime.   Insulin  Pen Needle (PEN NEEDLES) 30G X 5 MM MISC 1 pen by Does not apply route 4 (four) times daily - after meals and at bedtime.   losartan  (COZAAR ) 100 MG tablet Take 1 tablet (100 mg total) by mouth daily.   mycophenolate (CELLCEPT) 250 MG capsule Take 250 mg by mouth. Patient is currently taking one by mouth at night.   spironolactone  (ALDACTONE ) 25 MG tablet TAKE ONE-HALF TABLET BY MOUTH  TWICE DAILY   [DISCONTINUED] insulin  lispro (HUMALOG  KWIKPEN) 100 UNIT/ML KwikPen Inject 4-10 Units into the skin 3 (three) times daily with meals.   insulin  lispro (HUMALOG  KWIKPEN) 100 UNIT/ML KwikPen Inject 4-10 Units into the skin 3 (three) times daily with meals.   [DISCONTINUED] measles, mumps & rubella vaccine (MMR) injection Inject 0.5 ml intramuscularly. Please administer second dose after 4 weeks. (Patient not taking: Reported on 07/15/2023)   [DISCONTINUED] predniSONE (DELTASONE) 20 MG tablet Take 1 tablet by mouth daily with breakfast. (Patient not taking: Reported on 07/15/2023)   Facility-Administered Encounter Medications as of 07/15/2023  Medication   sodium chloride  flush (NS) 0.9 % injection 10 mL    ALLERGIES: No Known Allergies  VACCINATION STATUS: Immunization History  Administered Date(s) Administered   Tdap 11/28/2021   Zoster Recombinant(Shingrix ) 11/28/2021, 02/06/2022    Diabetes He presents for his follow-up diabetic visit. He has type 2 diabetes mellitus. Onset time: Diagnosed at approx age of 31. His disease course has been improving. There are no hypoglycemic associated symptoms. Pertinent negatives for diabetes include no fatigue. There are no hypoglycemic complications. Symptoms are stable. Diabetic complications include a CVA and heart disease. Risk factors for coronary artery disease include diabetes mellitus,  dyslipidemia, family history, male sex, obesity, hypertension and sedentary lifestyle. Current diabetic treatment includes intensive insulin  program. He is compliant with treatment most of the time. His weight is fluctuating minimally. He is following a generally healthy diet. When asked about meal planning, he reported none. He has not had a previous visit with a dietitian. He rarely participates in exercise. His home blood glucose trend is decreasing steadily. His overall blood glucose range is 140-180 mg/dl. (He presents today with his CGM showing improved, mostly at goal glycemic profile.  His POCT A1c today is 7.7%, improving from last A1c of 8.5%.  Analysis of his CGM shows TIR 70%, TAR 30%, TBR 0%.  He was on steroids between visits and had trouble managing his glucose during that time. ) An ACE inhibitor/angiotensin II receptor blocker is being taken. He does not see a podiatrist.Eye exam is current.  Hypertension This is a chronic problem. The current episode started more than 1 year ago. The problem has been resolved since onset. The problem is controlled. There are no associated agents to hypertension. Risk factors for coronary artery disease include diabetes mellitus, dyslipidemia, family history, obesity, male gender and sedentary lifestyle. Past treatments include beta blockers, diuretics and angiotensin blockers. The current treatment provides significant improvement. There are no compliance problems.  Hypertensive end-organ damage includes CAD/MI and CVA.  Hyperlipidemia This is a chronic problem. The current episode started more than 1 year ago. The problem is controlled. Recent lipid tests were reviewed and are normal. Exacerbating diseases include diabetes and obesity. Factors aggravating his hyperlipidemia include beta blockers and fatty foods. Current antihyperlipidemic treatment includes statins. The current treatment provides moderate improvement of lipids. Compliance problems include  adherence to diet and adherence to exercise.  Risk factors for coronary artery disease include diabetes mellitus, dyslipidemia, family history, hypertension, male sex, obesity and a sedentary lifestyle.     Review of systems  Constitutional: + increasing body weight, current Body mass index is 36.55 kg/m., + fatigue, no subjective hyperthermia, no subjective hypothermia Eyes: no blurry vision, no xerophthalmia ENT: no sore throat, no nodules palpated in throat, + dysphagia from previous stroke, no hoarseness Cardiovascular: no chest pain, no shortness of breath, no palpitations, no leg swelling Respiratory: no cough, no shortness of breath Gastrointestinal: no nausea/vomiting/diarrhea Musculoskeletal: no muscle/joint aches Skin: no rashes, no hyperemia Neurological: no tremors, no numbness, no tingling, no dizziness Psychiatric: no depression, no anxiety  Objective:     BP 128/78 (BP Location: Right Arm, Patient Position: Sitting, Cuff Size: Large)   Pulse 85   Ht 5\' 8"  (1.727 m)   Wt 240 lb 6.4 oz (109 kg)   BMI 36.55 kg/m   Wt Readings from Last 3 Encounters:  07/15/23 240 lb 6.4 oz (109 kg)  06/15/23 237 lb 12.8 oz (107.9 kg)  05/29/23 236 lb (107 kg)     BP Readings from Last 3 Encounters:  07/15/23 128/78  06/15/23 124/68  05/29/23 (!) 140/78      Physical Exam- Limited  Constitutional:  Body mass index is 36.55 kg/m. , not in acute distress, normal state of mind Eyes:  EOMI, no exophthalmos Musculoskeletal: no gross deformities, strength intact in all four extremities, no gross restriction of joint movements Skin:  no rashes, no hyperemia Neurological: no tremor with outstretched hands    Diabetic Foot Exam - Simple   No data filed       CMP ( most recent) CMP     Component Value Date/Time   NA 138 03/13/2023 0836   K 4.6 03/13/2023 0836   CL 99 03/13/2023 0836   CO2 20 03/13/2023 0836   GLUCOSE 173 (H) 03/13/2023 0836   GLUCOSE 161 (H)  09/02/2020 0451   BUN 10 03/13/2023 0836   CREATININE 0.87 03/13/2023 0836   CALCIUM  9.0 03/13/2023 0836   PROT 6.2 08/08/2022 0816   ALBUMIN 4.4 08/08/2022 0816   AST 16 08/08/2022 0816   ALT 19 08/08/2022 0816   ALKPHOS 94 08/08/2022 0816   BILITOT 0.3 08/08/2022 0816  GFRNONAA 95 09/23/2020 0000   GFRNONAA >60 09/02/2020 0451     Diabetic Labs (most recent): Lab Results  Component Value Date   HGBA1C 7.7 (A) 07/15/2023   HGBA1C 8.5 (A) 04/14/2023   HGBA1C 8.2 (A) 01/12/2023   MICROALBUR 80mg /L 05/08/2022   MICROALBUR 10 11/15/2020     Lipid Panel ( most recent) Lipid Panel     Component Value Date/Time   CHOL 180 05/29/2023 1000   TRIG 253 (H) 05/29/2023 1000   HDL 37 (L) 05/29/2023 1000   CHOLHDL 4.9 05/29/2023 1000   CHOLHDL 3.7 03/11/2021 0756   VLDL 21 03/11/2021 0756   LDLCALC 100 (H) 05/29/2023 1000   LABVLDL 43 (H) 05/29/2023 1000      Lab Results  Component Value Date   TSH 1.620 08/08/2022   TSH 1.920 02/03/2022   TSH 1.290 11/08/2021   TSH 0.787 09/01/2020   FREET4 1.13 02/03/2022   FREET4 1.20 11/08/2021           Assessment & Plan:   1) Type 2 diabetes mellitus with hyperglycemia, with long-term current use of insulin  (HCC)  He presents today with his CGM showing improved, mostly at goal glycemic profile.  His POCT A1c today is 7.7%, improving from last A1c of 8.5%.  Analysis of his CGM shows TIR 70%, TAR 30%, TBR 0%.  He was on steroids between visits and had trouble managing his glucose during that time.   - Tony Combs has currently uncontrolled symptomatic type 2 DM since 62 years of age.  -Recent labs reviewed.  - I had a long discussion with him about the progressive nature of diabetes and the pathology behind its complications. -his diabetes is complicated by CVA, CAD and he remains at a high risk for more acute and chronic complications which include CAD, CVA, CKD, retinopathy, and neuropathy. These are all discussed in  detail with him.  - Nutritional counseling repeated at each appointment due to patients tendency to fall back in to old habits.  - The patient admits there is a room for improvement in their diet and drink choices. -  Suggestion is made for the patient to avoid simple carbohydrates from their diet including Cakes, Sweet Desserts / Pastries, Ice Cream, Soda (diet and regular), Sweet Tea, Candies, Chips, Cookies, Sweet Pastries, Store Bought Juices, Alcohol in Excess of 1-2 drinks a day, Artificial Sweeteners, Coffee Creamer, and "Sugar-free" Products. This will help patient to have stable blood glucose profile and potentially avoid unintended weight gain.   - I encouraged the patient to switch to unprocessed or minimally processed complex starch and increased protein intake (animal or plant source), fruits, and vegetables.   - Patient is advised to stick to a routine mealtimes to eat 3 meals a day and avoid unnecessary snacks (to snack only to correct hypoglycemia).  - he will be scheduled with Penny Crumpton, RDN, CDE for diabetes education.    - I have approached him with the following individualized plan to manage his diabetes and patient agrees:   -He is advised to continue Toujeo  16 units SQ nightly and continue Humalog  4-10 units TID with meals if glucose is above 90 and he is eating (Specific instructions on how to titrate insulin  dosage based on glucose readings given to patient in writing).    -he is encouraged to continue monitoring glucose 4 times daily (using his CGM), before meals and before bed, and to call the clinic if he has readings less than 70 or  above 300 for 3 tests in a row.    - he is warned not to take insulin  without proper monitoring per orders. - Adjustment parameters are given to him for hypo and hyperglycemia in writing.  We did talk about possibly adding GLP1 medication today which would certainly reduce risk of another CVA.  However, given his history of stomach  issues, we decided against it as it would likely exacerbate those symptoms.  - Specific targets for  A1c; LDL, HDL, and Triglycerides were discussed with the patient.  2) Blood Pressure /Hypertension:  his blood pressure is controlled to target.   he is advised to continue his current medications including Coreg  12.5 mg p.o. twice daily, Lasix  20 mg po daily, Losartan  50 mg po daily, and Spironolactone  25 mg po daily.  He could not afford the copay for Entresto .  3) Lipids/Hyperlipidemia:    Review of his recent lipid panel from 05/29/23 showed uncontrolled LDL at 100 and elevated triglycerides of 253.  he is advised to continue Zetia  10 mg po daily mg daily at bedtime.  Side effects and precautions discussed with him.  Cardiology wanted him to try Repatha but he decided against it.  4)  Weight/Diet:  his Body mass index is 36.55 kg/m.  -  clearly complicating his diabetes care.   he is a candidate for weight loss. I discussed with him the fact that loss of 5 - 10% of his  current body weight will have the most impact on his diabetes management.  Exercise, and detailed carbohydrates information provided  -  detailed on discharge instructions.  5) Vitamin D  Deficiency His recent vitamin D  level on 08/08/22 was 26.5.  He is currently on supplementation, is advised to continue.  6) Chronic Care/Health Maintenance: -he is on ACEI/ARB and Statin medications and is encouraged to initiate and continue to follow up with Ophthalmology, Dentist, Podiatrist at least yearly or according to recommendations, and advised to stay away from smoking. I have recommended yearly flu vaccine and pneumonia vaccine at least every 5 years; moderate intensity exercise for up to 150 minutes weekly; and sleep for at least 7 hours a day.  - he is advised to maintain close follow up with Kermit Ped, Heath Litten, MD for primary care needs, as well as his other providers for optimal and coordinated care.     I spent  28  minutes in  the care of the patient today including review of labs from CMP, Lipids, Thyroid Function, Hematology (current and previous including abstractions from other facilities); face-to-face time discussing  his blood glucose readings/logs, discussing hypoglycemia and hyperglycemia episodes and symptoms, medications doses, his options of short and long term treatment based on the latest standards of care / guidelines;  discussion about incorporating lifestyle medicine;  and documenting the encounter. Risk reduction counseling performed per USPSTF guidelines to reduce obesity and cardiovascular risk factors.     Please refer to Patient Instructions for Blood Glucose Monitoring and Insulin /Medications Dosing Guide"  in media tab for additional information. Please  also refer to " Patient Self Inventory" in the Media  tab for reviewed elements of pertinent patient history.  Tony Combs participated in the discussions, expressed understanding, and voiced agreement with the above plans.  All questions were answered to his satisfaction. he is encouraged to contact clinic should he have any questions or concerns prior to his return visit.     Follow up plan: - Return in about 4 months (around 11/15/2023) for  Diabetes F/U with A1c in office, No previsit labs, Bring meter and logs.   Hulon Magic, [facility] [address] [place], [place] [address] Phone: [phone] Fax: [phone]  07/15/2023, 8:25 AM

## 2023-08-17 ENCOUNTER — Telehealth: Payer: Self-pay | Admitting: Nurse Practitioner

## 2023-08-17 NOTE — Telephone Encounter (Signed)
 Whitney do you think that is would be able to or us  order him a new CGM meter, is he a candidate to bump up to the [place] 3 plus sensors and a  [place]  3 reader.

## 2023-08-17 NOTE — Telephone Encounter (Signed)
 Pt is on last Libre 2 sensor and states he is having issues with the reader staying charged.  Please advise.  Pt has 12 days left on sensor.

## 2023-08-17 NOTE — Telephone Encounter (Signed)
 He can call [employer] (the makers of Herlene) let them know the issue and see what they say, they may go ahead and upgrade him to a libre 3 monitor (since the [place] 2 will be phased out soon) and then all we would have to do is upgrade his sensors to the [place] 3.

## 2023-08-18 NOTE — Telephone Encounter (Signed)
 Patient was called and made aware.

## 2023-09-07 ENCOUNTER — Other Ambulatory Visit: Payer: Self-pay | Admitting: Nurse Practitioner

## 2023-09-10 ENCOUNTER — Other Ambulatory Visit: Payer: Self-pay

## 2023-09-10 DIAGNOSIS — I10 Essential (primary) hypertension: Secondary | ICD-10-CM

## 2023-09-10 MED ORDER — LOSARTAN POTASSIUM 100 MG PO TABS: 100.0000 mg | ORAL_TABLET | Freq: Every day | ORAL | 3 refills | Status: AC

## 2023-09-17 ENCOUNTER — Ambulatory Visit: Payer: Self-pay

## 2023-09-17 VITALS — BP 128/76 | HR 84 | Ht 68.0 in | Wt 239.0 lb

## 2023-09-17 DIAGNOSIS — Z794 Long term (current) use of insulin: Secondary | ICD-10-CM | POA: Diagnosis not present

## 2023-09-17 DIAGNOSIS — E1165 Type 2 diabetes mellitus with hyperglycemia: Secondary | ICD-10-CM | POA: Diagnosis not present

## 2023-09-17 DIAGNOSIS — E1169 Type 2 diabetes mellitus with other specified complication: Secondary | ICD-10-CM | POA: Diagnosis not present

## 2023-09-17 DIAGNOSIS — E785 Hyperlipidemia, unspecified: Secondary | ICD-10-CM | POA: Diagnosis not present

## 2023-09-17 DIAGNOSIS — I10 Essential (primary) hypertension: Secondary | ICD-10-CM

## 2023-09-17 MED ORDER — SPIRONOLACTONE 25 MG PO TABS: 25.0000 mg | ORAL_TABLET | Freq: Every day | ORAL | 3 refills | Status: AC

## 2023-09-17 MED ORDER — EZETIMIBE 10 MG PO TABS: 10.0000 mg | ORAL_TABLET | Freq: Every day | ORAL | 3 refills | Status: AC

## 2023-09-17 MED ORDER — CARVEDILOL 6.25 MG PO TABS
12.5000 mg | ORAL_TABLET | Freq: Two times a day (BID) | ORAL | 3 refills | Status: AC
Start: 2023-09-17 — End: ?

## 2023-09-17 NOTE — Progress Notes (Signed)
 Established Patient Office Visit  Subjective   Patient ID: Tony Combs, male    DOB: 03-17-1960  Age: 63 y.o. MRN: [id number]  Chief Complaint  Patient presents with   Medical Management of Chronic Issues    3 month follow up    HPI  Patient Active Problem List   Diagnosis Date Noted   Essential (primary) hypertension 09/17/2023   Type 2 diabetes mellitus with hyperglycemia (HCC) 11/13/2022   Colon cancer screening 08/19/2022   Morbid obesity (HCC) 07/24/2022   OSA (obstructive sleep apnea) 07/24/2022   Coronary artery disease of native artery of native heart with stable angina pectoris (HCC) 07/24/2022   Myasthenia gravis (HCC) 03/01/2021   Thoracic aortic atherosclerosis (HCC) 03/01/2021   Cardiomyopathy, dilated, nonischemic (HCC) 01/17/2021   Hyperlipidemia associated with type 2 diabetes mellitus (HCC) 01/17/2021   Acute CVA (cerebrovascular accident) (HCC) 08/30/2020   Essential hypertension 08/30/2020   Pulmonary nodule 08/30/2020   Chronic combined systolic and diastolic heart failure (HCC) 08/29/2020    ROS    Objective:     BP 128/76   Pulse 84   Ht 5' 8 (1.727 m)   Wt 239 lb (108.4 kg)   SpO2 93%   BMI 36.34 kg/m  BP Readings from Last 3 Encounters:  09/17/23 128/76  07/15/23 128/78  06/15/23 124/68   Wt Readings from Last 3 Encounters:  09/17/23 239 lb (108.4 kg)  07/15/23 240 lb 6.4 oz (109 kg)  06/15/23 237 lb 12.8 oz (107.9 kg)      Physical Exam Vitals and nursing note reviewed. Exam conducted with a chaperone present (pt's wife is with him today).  Constitutional:      Appearance: Normal appearance. He is obese.  HENT:     Head: Normocephalic.  Eyes:     Extraocular Movements: Extraocular movements intact.     Pupils: Pupils are equal, round, and reactive to light.  Cardiovascular:     Rate and Rhythm: Normal rate and regular rhythm.  Pulmonary:     Effort: Pulmonary effort is normal.     Breath sounds: Normal breath sounds.   Abdominal:     General: Bowel sounds are normal.     Palpations: Abdomen is soft.  Musculoskeletal:     Cervical back: Normal range of motion and neck supple.     Right lower leg: No edema.     Left lower leg: No edema.  Neurological:     Mental Status: He is alert and oriented to person, place, and time.     Gait: Gait abnormal (walks with cane).  Psychiatric:        Mood and Affect: Mood normal.        Thought Content: Thought content normal.     The ASCVD Risk score (Arnett DK, et al., 2019) failed to calculate for the following reasons:   Risk score cannot be calculated because patient has a medical history suggesting prior/existing ASCVD    Assessment & Plan:   Problem List Items Addressed This Visit       Cardiovascular and Mediastinum   Essential (primary) hypertension   Stable with current medications.  Coreg  and spironolactone  refilled.  Recommend low-sodium diet and regular exercise, aiming for 150 minutes of exercise per week.      Relevant Medications   carvedilol  (COREG ) 6.25 MG tablet   ezetimibe  (ZETIA ) 10 MG tablet   spironolactone  (ALDACTONE ) 25 MG tablet     Endocrine   Hyperlipidemia associated with type 2  diabetes mellitus (HCC) - Primary (Chronic)   Check fasting labs today Zetia  10 mg renewed.  Follow-up according to lab results.      Relevant Medications   carvedilol  (COREG ) 6.25 MG tablet   ezetimibe  (ZETIA ) 10 MG tablet   spironolactone  (ALDACTONE ) 25 MG tablet   Other Relevant Orders   CMP14+EGFR (Completed)   Lipid panel (Completed)   Type 2 diabetes mellitus with hyperglycemia [facility])   He sees endocrinology for management of diabetes.  Most recent A1c 7.7 2 months ago.  Will recheck A1c along with other labs today.  Follow-up according to lab results.      Relevant Orders   CMP14+EGFR (Completed)   Hemoglobin A1c (Completed)   Microalbumin / creatinine urine ratio (Completed)   Lipid panel (Completed)     Other   Morbid obesity  (HCC)   Discussed need for weight loss with healthy diet and regular exercise.       Return in about 4 months (around 01/17/2024) for chronic follow-up with PCP.    Leita Longs, FNP

## 2023-09-18 LAB — CMP14+EGFR
ALT: 21 IU/L (ref 0–44)
AST: 14 IU/L (ref 0–40)
Albumin: 4.1 g/dL (ref 3.9–4.9)
Alkaline Phosphatase: 80 IU/L (ref 44–121)
BUN/Creatinine Ratio: 21 (ref 10–24)
BUN: 22 mg/dL (ref 8–27)
Bilirubin Total: 0.5 mg/dL (ref 0.0–1.2)
CO2: 20 mmol/L (ref 20–29)
Calcium: 8.9 mg/dL (ref 8.6–10.2)
Chloride: 103 mmol/L (ref 96–106)
Creatinine, Ser: 1.06 mg/dL (ref 0.76–1.27)
Globulin, Total: 2.9 g/dL (ref 1.5–4.5)
Glucose: 176 mg/dL — ABNORMAL HIGH (ref 70–99)
Potassium: 4.8 mmol/L (ref 3.5–5.2)
Sodium: 140 mmol/L (ref 134–144)
Total Protein: 7 g/dL (ref 6.0–8.5)
eGFR: 78 mL/min/1.73 (ref >59)

## 2023-09-18 LAB — LIPID PANEL
Chol/HDL Ratio: 4.8 ratio (ref 0.0–5.0)
Cholesterol, Total: 139 mg/dL (ref 100–199)
HDL: 29 mg/dL — ABNORMAL LOW (ref >39)
LDL Chol Calc (NIH): 70 mg/dL (ref 0–99)
Triglycerides: 242 mg/dL — ABNORMAL HIGH (ref 0–149)
VLDL Cholesterol Cal: 40 mg/dL (ref 5–40)

## 2023-09-18 LAB — MICROALBUMIN / CREATININE URINE RATIO
Creatinine, Urine: 407.6 mg/dL
Microalb/Creat Ratio: 5 mg/g{creat} (ref 0–29)
Microalbumin, Urine: 19 ug/mL

## 2023-09-18 LAB — HEMOGLOBIN A1C
Est. average glucose Bld gHb Est-mCnc: 180 mg/dL
Hgb A1c MFr Bld: 7.9 % — ABNORMAL HIGH (ref 4.8–5.6)

## 2023-09-22 ENCOUNTER — Other Ambulatory Visit: Payer: Self-pay | Admitting: *Deleted

## 2023-09-22 ENCOUNTER — Ambulatory Visit: Payer: Self-pay

## 2023-09-22 DIAGNOSIS — E1165 Type 2 diabetes mellitus with hyperglycemia: Secondary | ICD-10-CM

## 2023-09-22 MED ORDER — FREESTYLE LIBRE 2 SENSOR MISC
3 refills | Status: AC
Start: 2023-09-22 — End: ?

## 2023-09-22 NOTE — Assessment & Plan Note (Signed)
 Discussed need for weight loss with healthy diet and regular exercise.

## 2023-09-22 NOTE — Assessment & Plan Note (Signed)
 Stable with current medications.  Coreg  and spironolactone  refilled.  Recommend low-sodium diet and regular exercise, aiming for 150 minutes of exercise per week.

## 2023-09-22 NOTE — Assessment & Plan Note (Signed)
 Check fasting labs today Zetia  10 mg renewed.  Follow-up according to lab results.

## 2023-09-22 NOTE — Assessment & Plan Note (Signed)
 He sees endocrinology for management of diabetes.  Most recent A1c 7.7 2 months ago.  Will recheck A1c along with other labs today.  Follow-up according to lab results.

## 2023-09-23 ENCOUNTER — Other Ambulatory Visit: Payer: Self-pay | Admitting: *Deleted

## 2023-09-23 ENCOUNTER — Telehealth: Payer: Self-pay | Admitting: *Deleted

## 2023-09-23 NOTE — Telephone Encounter (Signed)
 Patient's wife left a message that [facility] odes not cover the patient's sensors. They left a number , [phone], I have called this number and it states that it is not a working number, and that [employer] may record the call.  I have called and shared this on patient's voicemail. I ask that they call back and provide the pharmacy name and verify that I wrote the correct number. So that we may be able to get his sensors refilled.

## 2023-09-24 NOTE — Telephone Encounter (Signed)
 It has been sent to [facility] on the August 5

## 2023-09-24 NOTE — Telephone Encounter (Signed)
 Pt's wife stopped by and she said that the [place] 3 plus needs to be sent in to [facility]

## 2023-11-17 ENCOUNTER — Ambulatory Visit: Admitting: Nurse Practitioner

## 2023-11-17 ENCOUNTER — Encounter: Payer: Self-pay | Admitting: Nurse Practitioner

## 2023-11-17 VITALS — BP 110/60 | HR 76 | Ht 68.0 in | Wt 226.4 lb

## 2023-11-17 DIAGNOSIS — E1165 Type 2 diabetes mellitus with hyperglycemia: Secondary | ICD-10-CM | POA: Diagnosis not present

## 2023-11-17 DIAGNOSIS — Z794 Long term (current) use of insulin: Secondary | ICD-10-CM | POA: Diagnosis not present

## 2023-11-17 DIAGNOSIS — E782 Mixed hyperlipidemia: Secondary | ICD-10-CM

## 2023-11-17 DIAGNOSIS — I10 Essential (primary) hypertension: Secondary | ICD-10-CM

## 2023-11-17 NOTE — Progress Notes (Signed)
 Endocrinology Follow Up Note       11/17/2023, 8:33 AM   Subjective:    Patient ID: Tony Combs, male    DOB: [birth year].  Tony Combs is being seen in follow up after being seen in consultation for management of currently uncontrolled symptomatic diabetes requested by  Bevely Doffing, FNP.   Past Medical History:  Diagnosis Date   CAD in native artery 03/11/2021   Small Non-dominant RCA - prox 90% (CTA FFR 0.66) - Not favorable for PCI - small caliber, non-dominant (<2 mm) vessel.  Mininal diffuse disease in LCx.   Diabetes mellitus without complication (HCC)    Dilated cardiomyopathy (HCC) 01/17/2021   In setting of stroke: TTE 08/31/2020: EF 30 and 35%.  Mildly decreased function.  Moderate LVH.  Unable to assess diastolic function.  Unable assess RV function.  Mild LA dilation.   Hyperlipidemia    Hypertension    Myasthenia gravis (HCC) 03/01/2021   Followed by Dr. 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    Past Surgical History:  Procedure Laterality Date   Coronary CT Angiogram  01/31/2021   Coronary calcium  score 40.  No CAD RADS 3.  Moderate stenosis.  RCA has moderate obstructive plaque in the mid and distal vessel.  Otherwise 25-49% OM1. => CT FFR positive, 0.66 in the RCA.   HERNIA REPAIR     LEFT HEART CATH AND CORONARY ANGIOGRAPHY N/A 03/11/2021   Procedure: LEFT HEART CATH AND CORONARY ANGIOGRAPHY;  Surgeon: Anner Alm LELON, MD;  Location: [facility] INVASIVE CV LAB;;   Prox RCA lesion is 90% stenosed.-Small to moderate caliber nondominant vessel (<2 mm) => not PCI amenable.  Not the cause of cardiomyopathy.   Otherwise minimal diffuse CAD and a Left Dominant System.   LV EDP is normal with Systemic Hypertension (Central Aop ~20 mmHg > BP Cuff)   TRANSTHORACIC ECHOCARDIOGRAM  08/31/2020   a) EF 30 and 35%.  Mildly decreased  function.  Moderate LVH.  Unable to assess diastolic function.  Unable assess RV function.  Mild LA dilation.; b) 12/21/2020: EF remains 30 to 35% with moderately reduced function.  Global HK.  GR 1 DD.  Normal RV size and function.  Normal aortic valves.  Cannot exclude small PFO. => 02/07/21 -> NEGATIVE BUBBLE STUDY.    Social History   Socioeconomic History   Marital status: Married    Spouse name: Not on file   Number of children: Not on file   Years of education: Not on file   Highest education level: Not on file  Occupational History   Not on file  Tobacco Use   Smoking status: Never   Smokeless tobacco: Never  Vaping Use   Vaping status: Never Used  Substance and Sexual Activity   Alcohol use: Not Currently   Drug use: Not Currently   Sexual activity: Not Currently  Other Topics Concern   Not on file  Social History Narrative   Not on file   Social Drivers of Health   Financial Resource Strain: Not on file  Food Insecurity: Not on file  Transportation  Needs: Not on file  Physical Activity: Not on file  Stress: Not on file  Social Connections: Not on file    Family History  Problem Relation Age of Onset   Hypertension Mother    Diabetes Mother    Heart failure Father 29   Heart attack Father 35   Hypertension Father    Diabetes type II Father    Coronary artery disease Father 70   Congestive Heart Failure Father    Diabetes Brother     Outpatient Encounter Medications as of 11/17/2023  Medication Sig   aspirin  EC 81 MG tablet Take 81 mg by mouth in the morning. Swallow whole.   B-D UF III MINI PEN NEEDLES 31G X 5 MM MISC SMARTSIG:1 Pre-Filled Pen Syringe SUB-Q 4 Times Daily   carvedilol  (COREG ) 6.25 MG tablet Take 2 tablets (12.5 mg total) by mouth 2 (two) times daily with a meal.   Continuous Glucose Sensor (FREESTYLE LIBRE 2 SENSOR) MISC Use to monitor glucose continuously E11.65   ezetimibe  (ZETIA ) 10 MG tablet Take 1 tablet (10 mg total) by mouth daily.    insulin  glargine, 1 Unit Dial , (TOUJEO ) 300 UNIT/ML Solostar Pen Inject 16 Units into the skin at bedtime.   insulin  lispro (HUMALOG  KWIKPEN) 100 UNIT/ML KwikPen INJECT 4 TO 10 UNITS INTO THE  SKIN 3 TIMES DAILY WITH MEALS   Insulin  Pen Needle (PEN NEEDLES) 30G X 5 MM MISC 1 pen by Does not apply route 4 (four) times daily - after meals and at bedtime.   losartan  (COZAAR ) 100 MG tablet Take 1 tablet (100 mg total) by mouth daily.   mycophenolate (CELLCEPT) 250 MG capsule Take 250 mg by mouth. Patient is currently taking one by mouth at night.   spironolactone  (ALDACTONE ) 25 MG tablet Take 1 tablet (25 mg total) by mouth daily.   No facility-administered encounter medications on file as of 11/17/2023.    ALLERGIES: No Known Allergies  VACCINATION STATUS: Immunization History  Administered Date(s) Administered   PNEUMOCOCCAL CONJUGATE-20 04/22/2023   Tdap 11/28/2021   Zoster Recombinant(Shingrix ) 11/28/2021, 02/06/2022    Diabetes He presents for his follow-up diabetic visit. He has type 2 diabetes mellitus. Onset time: Diagnosed at approx age of 65. His disease course has been stable. There are no hypoglycemic associated symptoms. Pertinent negatives for diabetes include no fatigue. There are no hypoglycemic complications. Symptoms are stable. Diabetic complications include heart disease. Risk factors for coronary artery disease include diabetes mellitus, dyslipidemia, family history, male sex, obesity, hypertension and sedentary lifestyle. Current diabetic treatment includes intensive insulin  program. He is compliant with treatment most of the time. His weight is fluctuating minimally. He is following a generally healthy diet. When asked about meal planning, he reported none. He has not had a previous visit with a dietitian. He rarely participates in exercise. His home blood glucose trend is fluctuating minimally. His overall blood glucose range is 130-140 mg/dl. (He presents today with his  CGM showing improved, mostly at goal glycemic profile.  His previsit A1c on 7/31 was 7.9%, increasing slightly from last visit of 7.7%.  Analysis of his CGM shows TIR 81%, TAR 18%, TBR 1% with a GMI of 6.7%.  He did have teeth pulled and has not gotten his dentures yet, thus his diet has greatly been altered as a result.  He did go for a period of time without his insulin  due to low readings shortly after his dental extractions.) An ACE inhibitor/angiotensin II receptor blocker is being taken. He  does not see a podiatrist.Eye exam is current.     Review of systems  Constitutional: + decreasing body weight, current Body mass index is 34.42 kg/m., + fatigue, no subjective hyperthermia, no subjective hypothermia Eyes: no blurry vision, no xerophthalmia ENT: no sore throat, no nodules palpated in throat, + dysphagia from previous stroke, no hoarseness Cardiovascular: no chest pain, no shortness of breath, no palpitations, no leg swelling Respiratory: no cough, no shortness of breath Gastrointestinal: no nausea/vomiting/diarrhea Musculoskeletal: no muscle/joint aches Skin: no rashes, no hyperemia Neurological: no tremors, no numbness, no tingling, no dizziness Psychiatric: no depression, no anxiety  Objective:     BP 110/60 (BP Location: Left Arm, Patient Position: Sitting, Cuff Size: Large)   Pulse 76   Ht 5' 8 (1.727 m)   Wt 226 lb 6.4 oz (102.7 kg)   BMI 34.42 kg/m   Wt Readings from Last 3 Encounters:  11/17/23 226 lb 6.4 oz (102.7 kg)  09/17/23 239 lb (108.4 kg)  07/15/23 240 lb 6.4 oz (109 kg)     BP Readings from Last 3 Encounters:  11/17/23 110/60  09/17/23 128/76  07/15/23 128/78      Physical Exam- Limited  Constitutional:  Body mass index is 34.42 kg/m. , not in acute distress, normal state of mind Eyes:  EOMI, no exophthalmos Musculoskeletal: no gross deformities, strength intact in all four extremities, no gross restriction of joint movements Skin:  no rashes,  no hyperemia Neurological: no tremor with outstretched hands    Diabetic Foot Exam - Simple   Simple Foot Form Diabetic Foot exam was performed with the following findings: Yes 11/17/2023  8:25 AM  Visual Inspection No deformities, no ulcerations, no other skin breakdown bilaterally: Yes Sensation Testing Intact to touch and monofilament testing bilaterally: Yes Pulse Check Posterior Tibialis and Dorsalis pulse intact bilaterally: Yes Comments       CMP ( most recent) CMP     Component Value Date/Time   NA 140 09/17/2023 0940   K 4.8 09/17/2023 0940   CL 103 09/17/2023 0940   CO2 20 09/17/2023 0940   GLUCOSE 176 (H) 09/17/2023 0940   GLUCOSE 161 (H) 09/02/2020 0451   BUN 22 09/17/2023 0940   CREATININE 1.06 09/17/2023 0940   CALCIUM  8.9 09/17/2023 0940   PROT 7.0 09/17/2023 0940   ALBUMIN 4.1 09/17/2023 0940   AST 14 09/17/2023 0940   ALT 21 09/17/2023 0940   ALKPHOS 80 09/17/2023 0940   BILITOT 0.5 09/17/2023 0940   GFRNONAA 95 09/23/2020 0000   GFRNONAA >60 09/02/2020 0451     Diabetic Labs (most recent): Lab Results  Component Value Date   HGBA1C 7.9 (H) 09/17/2023   HGBA1C 7.7 (A) 07/15/2023   HGBA1C 8.5 (A) 04/14/2023   MICROALBUR 80mg /L 05/08/2022   MICROALBUR 10 11/15/2020     Lipid Panel ( most recent) Lipid Panel     Component Value Date/Time   CHOL 139 09/17/2023 0940   TRIG 242 (H) 09/17/2023 0940   HDL 29 (L) 09/17/2023 0940   CHOLHDL 4.8 09/17/2023 0940   CHOLHDL 3.7 03/11/2021 0756   VLDL 21 03/11/2021 0756   LDLCALC 70 09/17/2023 0940   LABVLDL 40 09/17/2023 0940      Lab Results  Component Value Date   TSH 1.620 08/08/2022   TSH 1.920 02/03/2022   TSH 1.290 11/08/2021   TSH 0.787 09/01/2020   FREET4 1.13 02/03/2022   FREET4 1.20 11/08/2021  Assessment & Plan:   1) Type 2 diabetes mellitus with hyperglycemia, with long-term current use of insulin  (HCC)  He presents today with his CGM showing improved, mostly  at goal glycemic profile.  His previsit A1c on 7/31 was 7.9%, increasing slightly from last visit of 7.7%.  Analysis of his CGM shows TIR 81%, TAR 18%, TBR 1% with a GMI of 6.7%.  He did have teeth pulled and has not gotten his dentures yet, thus his diet has greatly been altered as a result.  He did go for a period of time without his insulin  due to low readings shortly after his dental extractions.  - Tony Combs has currently uncontrolled symptomatic type 2 DM since 63 years of age.  -Recent labs reviewed.  - I had a long discussion with him about the progressive nature of diabetes and the pathology behind its complications. -his diabetes is complicated by CVA, CAD and he remains at a high risk for more acute and chronic complications which include CAD, CVA, CKD, retinopathy, and neuropathy. These are all discussed in detail with him.  - Nutritional counseling repeated at each appointment due to patients tendency to fall back in to old habits.  - The patient admits there is a room for improvement in their diet and drink choices. -  Suggestion is made for the patient to avoid simple carbohydrates from their diet including Cakes, Sweet Desserts / Pastries, Ice Cream, Soda (diet and regular), Sweet Tea, Candies, Chips, Cookies, Sweet Pastries, Store Bought Juices, Alcohol in Excess of 1-2 drinks a day, Artificial Sweeteners, Coffee Creamer, and Sugar-free Products. This will help patient to have stable blood glucose profile and potentially avoid unintended weight gain.   - I encouraged the patient to switch to unprocessed or minimally processed complex starch and increased protein intake (animal or plant source), fruits, and vegetables.   - Patient is advised to stick to a routine mealtimes to eat 3 meals a day and avoid unnecessary snacks (to snack only to correct hypoglycemia).  - he will be scheduled with Santana Duke, RDN, CDE for diabetes education.    - I have approached him with the  following individualized plan to manage his diabetes and patient agrees:   -He is advised to continue Toujeo  16 units SQ nightly and continue Humalog  4-10 units TID with meals if glucose is above 90 and he is eating (Specific instructions on how to titrate insulin  dosage based on glucose readings given to patient in writing).    -he is encouraged to continue monitoring glucose 4 times daily (using his CGM), before meals and before bed, and to call the clinic if he has readings less than 70 or above 300 for 3 tests in a row.    - he is warned not to take insulin  without proper monitoring per orders. - Adjustment parameters are given to him for hypo and hyperglycemia in writing.  We did talk about possibly adding GLP1 medication today which would certainly reduce risk of another CVA.  However, given his history of stomach issues, we decided against it as it would likely exacerbate those symptoms.  - Specific targets for  A1c; LDL, HDL, and Triglycerides were discussed with the patient.  2) Blood Pressure /Hypertension:  his blood pressure is controlled to target.   he is advised to continue his current medications including Coreg  12.5 mg p.o. twice daily, Lasix  20 mg po daily, Losartan  50 mg po daily, and Spironolactone  25 mg po daily.  He  could not afford the copay for Entresto .  3) Lipids/Hyperlipidemia:    Review of his recent lipid panel from 09/17/23 showed controlled LDL at 70 and elevated triglycerides of 242.  he is advised to continue Zetia  10 mg po daily mg daily at bedtime.  Side effects and precautions discussed with him.  Cardiology wanted him to try Repatha but he decided against it.  4)  Weight/Diet:  his Body mass index is 34.42 kg/m.  -  clearly complicating his diabetes care.   he is a candidate for weight loss. I discussed with him the fact that loss of 5 - 10% of his  current body weight will have the most impact on his diabetes management.  Exercise, and detailed carbohydrates  information provided  -  detailed on discharge instructions.  5) Vitamin D  Deficiency His recent vitamin D  level on 08/08/22 was 26.5.  He is currently on supplementation, is advised to continue.  6) Chronic Care/Health Maintenance: -he is on ACEI/ARB and Statin medications and is encouraged to initiate and continue to follow up with Ophthalmology, Dentist, Podiatrist at least yearly or according to recommendations, and advised to stay away from smoking. I have recommended yearly flu vaccine and pneumonia vaccine at least every 5 years; moderate intensity exercise for up to 150 minutes weekly; and sleep for at least 7 hours a day.  - he is advised to maintain close follow up with Bevely Doffing, FNP for primary care needs, as well as his other providers for optimal and coordinated care.      I spent  31  minutes in the care of the patient today including review of labs from CMP, Lipids, Thyroid Function, Hematology (current and previous including abstractions from other facilities); face-to-face time discussing  his blood glucose readings/logs, discussing hypoglycemia and hyperglycemia episodes and symptoms, medications doses, his options of short and long term treatment based on the latest standards of care / guidelines;  discussion about incorporating lifestyle medicine;  and documenting the encounter. Risk reduction counseling performed per USPSTF guidelines to reduce obesity and cardiovascular risk factors.     Please refer to Patient Instructions for Blood Glucose Monitoring and Insulin /Medications Dosing Guide  in media tab for additional information. Please  also refer to  Patient Self Inventory in the Media  tab for reviewed elements of pertinent patient history.  Tony Combs participated in the discussions, expressed understanding, and voiced agreement with the above plans.  All questions were answered to his satisfaction. he is encouraged to contact clinic should he have any  questions or concerns prior to his return visit.     Follow up plan: - Return in about 4 months (around 03/18/2024) for Diabetes F/U with A1c in office, No previsit labs, Bring meter and logs.   Benton Rio, [facility] [address] [place], [place] [address] Phone: [phone] Fax: [phone]  11/17/2023, 8:33 AM

## 2023-11-26 ENCOUNTER — Other Ambulatory Visit: Payer: Self-pay

## 2023-12-23 ENCOUNTER — Other Ambulatory Visit: Payer: Self-pay | Admitting: Nurse Practitioner

## 2024-01-03 ENCOUNTER — Other Ambulatory Visit: Payer: Self-pay | Admitting: Nurse Practitioner

## 2024-01-03 DIAGNOSIS — Z794 Long term (current) use of insulin: Secondary | ICD-10-CM

## 2024-01-20 ENCOUNTER — Ambulatory Visit

## 2024-03-18 ENCOUNTER — Ambulatory Visit: Admitting: Nurse Practitioner

## 2024-03-22 ENCOUNTER — Ambulatory Visit
